# Patient Record
Sex: Female | Born: 1950 | Race: White | Hispanic: No | Smoking: Never smoker
Health system: Southern US, Community
[De-identification: ages and names within clinical notes are randomized; demographics above are authoritative.]

## PROBLEM LIST (undated history)

## (undated) DIAGNOSIS — G8929 Other chronic pain: Secondary | ICD-10-CM

## (undated) DIAGNOSIS — G459 Transient cerebral ischemic attack, unspecified: Secondary | ICD-10-CM

## (undated) DIAGNOSIS — K219 Gastro-esophageal reflux disease without esophagitis: Secondary | ICD-10-CM

## (undated) DIAGNOSIS — N390 Urinary tract infection, site not specified: Secondary | ICD-10-CM

## (undated) DIAGNOSIS — T7840XA Allergy, unspecified, initial encounter: Secondary | ICD-10-CM

## (undated) DIAGNOSIS — G43909 Migraine, unspecified, not intractable, without status migrainosus: Secondary | ICD-10-CM

## (undated) DIAGNOSIS — Z8679 Personal history of other diseases of the circulatory system: Secondary | ICD-10-CM

## (undated) DIAGNOSIS — I10 Essential (primary) hypertension: Secondary | ICD-10-CM

## (undated) DIAGNOSIS — E039 Hypothyroidism, unspecified: Secondary | ICD-10-CM

## (undated) DIAGNOSIS — M1990 Unspecified osteoarthritis, unspecified site: Secondary | ICD-10-CM

## (undated) DIAGNOSIS — R51 Headache: Secondary | ICD-10-CM

## (undated) DIAGNOSIS — K529 Noninfective gastroenteritis and colitis, unspecified: Secondary | ICD-10-CM

## (undated) DIAGNOSIS — F419 Anxiety disorder, unspecified: Secondary | ICD-10-CM

## (undated) DIAGNOSIS — G629 Polyneuropathy, unspecified: Secondary | ICD-10-CM

## (undated) DIAGNOSIS — R519 Headache, unspecified: Secondary | ICD-10-CM

## (undated) HISTORY — PX: BUNIONECTOMY: SHX129

## (undated) HISTORY — DX: Gastro-esophageal reflux disease without esophagitis: K21.9

## (undated) HISTORY — DX: Other chronic pain: G89.29

## (undated) HISTORY — DX: Hypothyroidism, unspecified: E03.9

## (undated) HISTORY — DX: Personal history of other diseases of the circulatory system: Z86.79

## (undated) HISTORY — PX: FOOT SURGERY: SHX648

## (undated) HISTORY — PX: UPPER GI ENDOSCOPY: SHX6162

## (undated) HISTORY — PX: WISDOM TOOTH EXTRACTION: SHX21

## (undated) HISTORY — DX: Unspecified osteoarthritis, unspecified site: M19.90

## (undated) HISTORY — DX: Noninfective gastroenteritis and colitis, unspecified: K52.9

## (undated) HISTORY — DX: Essential (primary) hypertension: I10

## (undated) HISTORY — DX: Transient cerebral ischemic attack, unspecified: G45.9

## (undated) HISTORY — DX: Anxiety disorder, unspecified: F41.9

## (undated) HISTORY — PX: TUBAL LIGATION: SHX77

## (undated) HISTORY — DX: Urinary tract infection, site not specified: N39.0

## (undated) HISTORY — DX: Headache: R51

## (undated) HISTORY — PX: EXCISION OF BREAST BIOPSY: SHX5822

## (undated) HISTORY — DX: Allergy, unspecified, initial encounter: T78.40XA

## (undated) HISTORY — DX: Migraine, unspecified, not intractable, without status migrainosus: G43.909

## (undated) HISTORY — DX: Polyneuropathy, unspecified: G62.9

## (undated) HISTORY — DX: Headache, unspecified: R51.9

## (undated) HISTORY — PX: COLONOSCOPY: SHX174

---
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
## 1982-03-16 DIAGNOSIS — K529 Noninfective gastroenteritis and colitis, unspecified: Secondary | ICD-10-CM

## 1982-03-16 HISTORY — DX: Noninfective gastroenteritis and colitis, unspecified: K52.9

## 2008-03-16 HISTORY — PX: FOOT SURGERY: SHX648

## 2013-03-16 HISTORY — PX: COLONOSCOPY: SHX174

## 2013-12-14 LAB — HM COLONOSCOPY

## 2015-09-02 LAB — HM HEPATITIS C SCREENING LAB: HM Hepatitis Screen: NEGATIVE

## 2016-08-12 IMAGING — US US THYROID
1 series · 13 of 25 positions shown · non-contrast
Comparison: None.

CLINICAL DATA: Hypothyroidism, enlarged thyroid on exam

EXAM:
THYROID ULTRASOUND
TECHNIQUE: Ultrasound examination of the thyroid gland and adjacent soft
tissues was performed.

[Series 1: us thyroid · 0.07mm/px · 50 acquisitions, 13 frames shown]
[im 1/50]
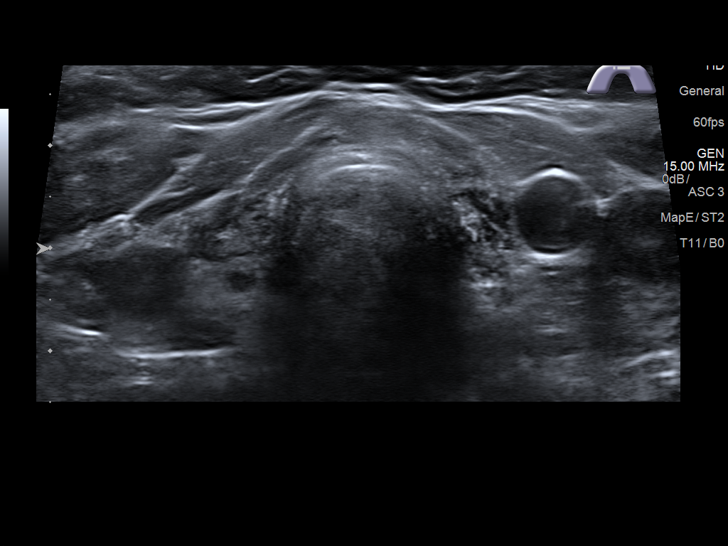
[im 5/50]
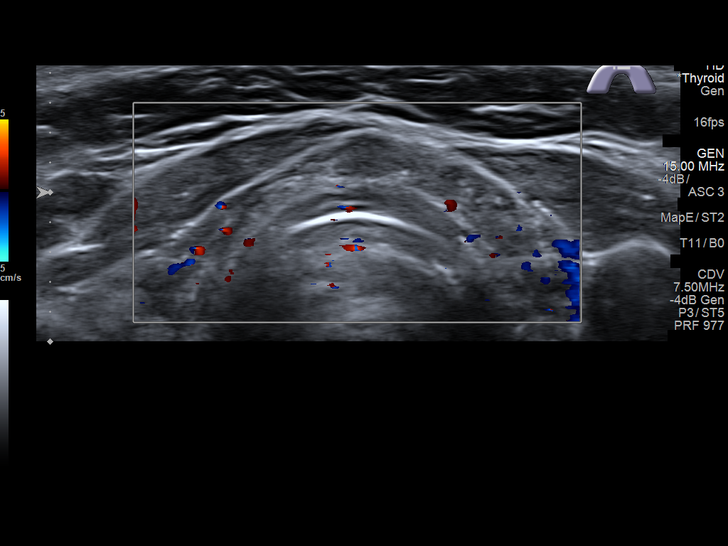
[im 9/50]
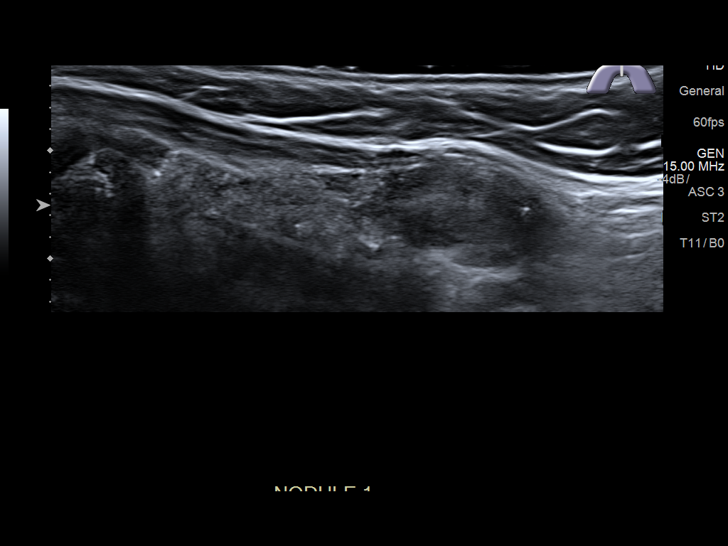
[im 13/50]
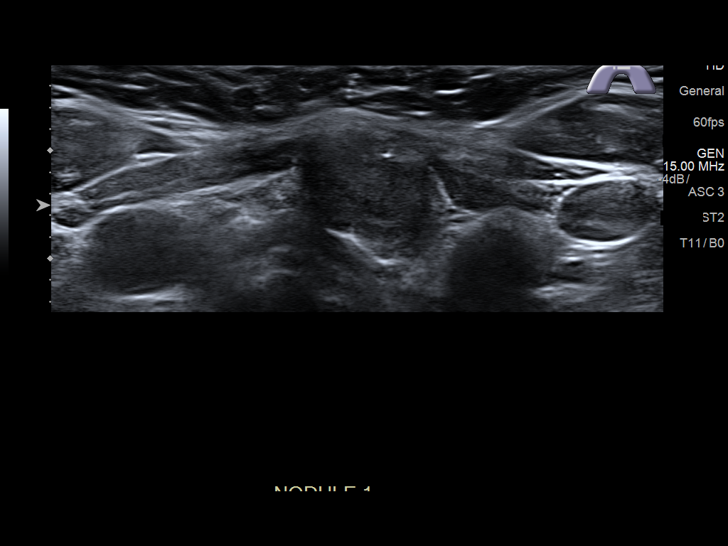
[im 17/50]
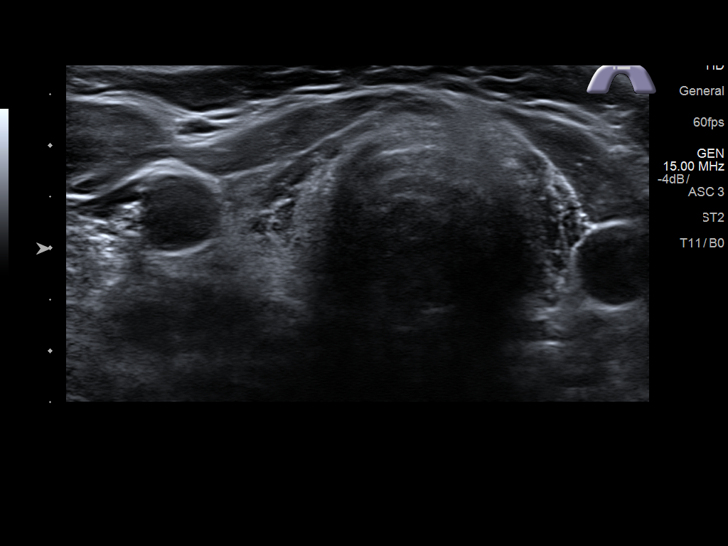
[im 21/50]
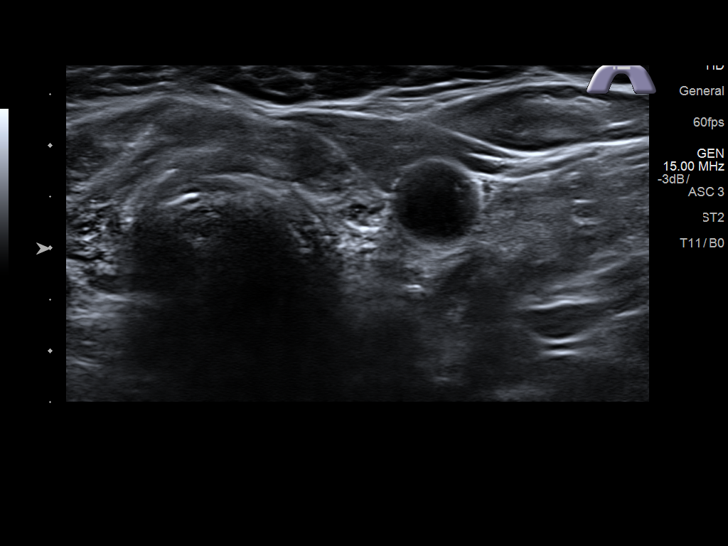
[im 25/50]
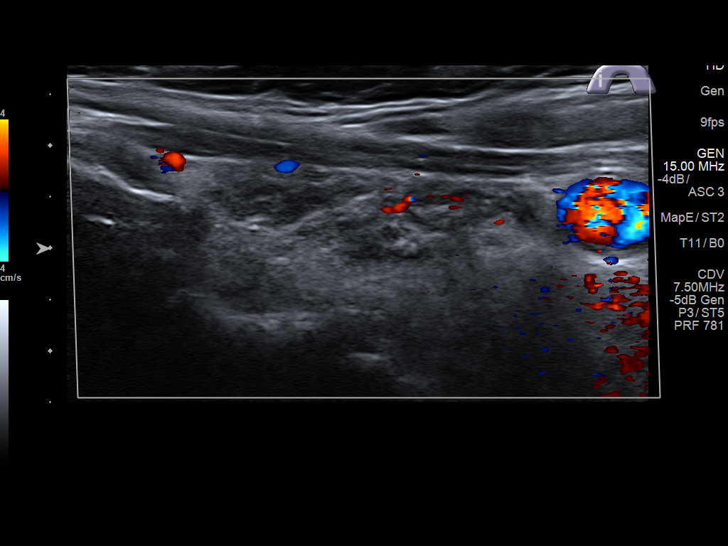
[im 29/50]
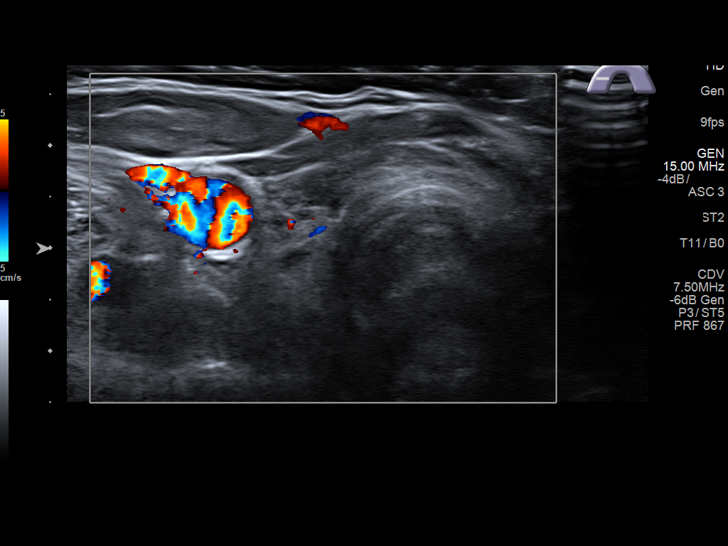
[im 33/50]
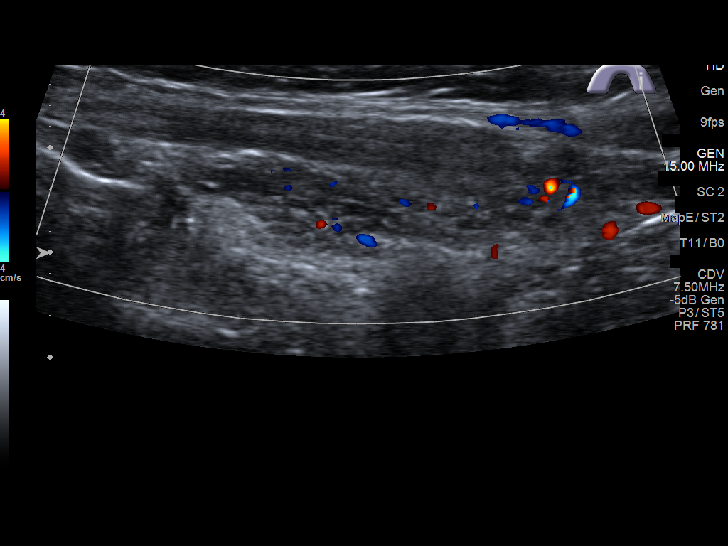
[im 37/50]
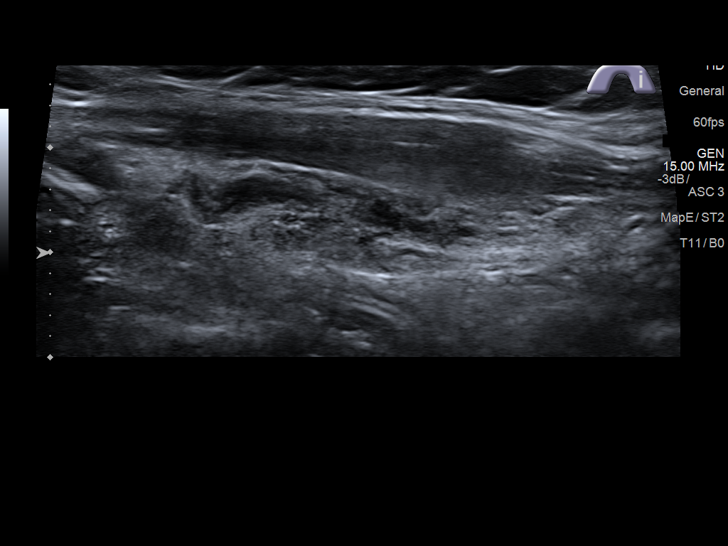
[im 41/50]
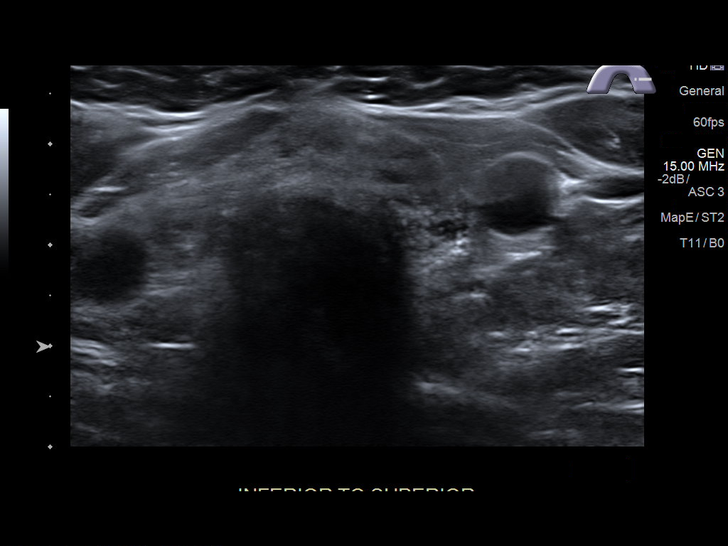
[im 45/50]
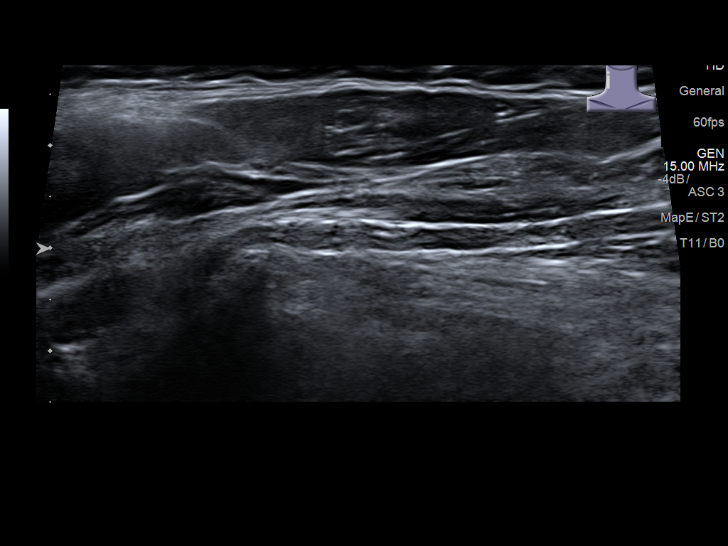
[im 50/50]
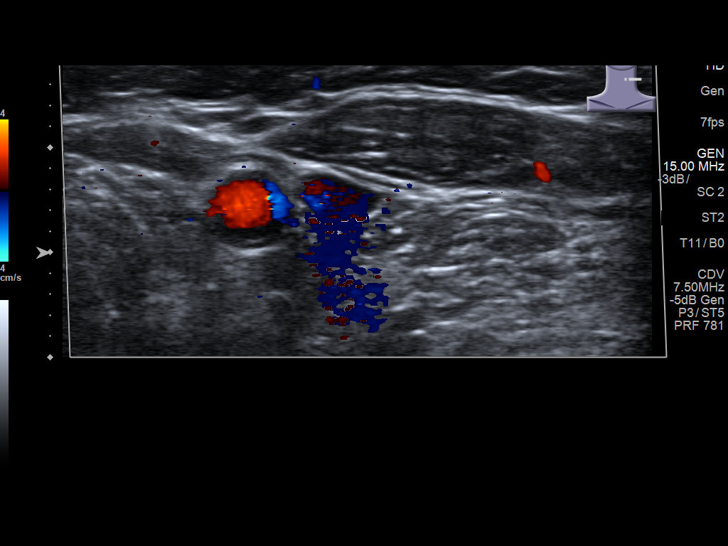

[13 of 25 positions shown; findings below may reference images not displayed]

FINDINGS: Parenchymal Echotexture: Markedly heterogenous

Isthmus: 4 mm

Right lobe: 3.9 x 1.3 x 1.1 cm

Left lobe: 4.2 x 0.9 x

_________________________________________________________

Estimated total number of nodules >/= 1 cm: 1

Number of spongiform nodules >/=  2 cm not described below (TR1): 0

Number of mixed cystic and solid nodules >/= 1.5 cm not described
below (TR2): 0

_________________________________________________________

Nodule # 1:

Location: Isthmus; Inferior

Maximum size: 1.6 cm; Other 2 dimensions: 1.3 x 1.2 cm

Composition: solid/almost completely solid (2)

Echogenicity: hypoechoic (2)

Shape: not taller-than-wide (0)

Margins: ill-defined (0)

Echogenic foci: none (0)

ACR TI-RADS total points: 4.

ACR TI-RADS risk category: TR4 (4-6 points).

ACR TI-RADS recommendations:

**Given size (>/= 1.5 cm) and appearance, fine needle aspiration of
this moderately suspicious nodule should be considered based on
TI-RADS criteria.

_________________________________________________________

Marked gland heterogeneity, nonspecific. No hypervascularity. No
adenopathy.
IMPRESSION: 1.6 cm inferior isthmus TR 4 nodule meets criteria for biopsy as
above.

The above is in keeping with the ACR TI-RADS recommendations - [HOSPITAL] [IW];[DATE].

## 2017-04-21 ENCOUNTER — Ambulatory Visit (INDEPENDENT_AMBULATORY_CARE_PROVIDER_SITE_OTHER): Payer: Medicare Other | Admitting: Primary Care

## 2017-04-21 ENCOUNTER — Encounter: Payer: Self-pay | Admitting: Primary Care

## 2017-04-21 ENCOUNTER — Other Ambulatory Visit: Payer: Self-pay | Admitting: Primary Care

## 2017-04-21 VITALS — BP 128/66 | HR 52 | Temp 97.9°F | Ht 67.5 in | Wt 183.0 lb

## 2017-04-21 DIAGNOSIS — E559 Vitamin D deficiency, unspecified: Secondary | ICD-10-CM | POA: Diagnosis not present

## 2017-04-21 DIAGNOSIS — E039 Hypothyroidism, unspecified: Secondary | ICD-10-CM

## 2017-04-21 DIAGNOSIS — E538 Deficiency of other specified B group vitamins: Secondary | ICD-10-CM

## 2017-04-21 DIAGNOSIS — Z23 Encounter for immunization: Secondary | ICD-10-CM

## 2017-04-21 LAB — COMPREHENSIVE METABOLIC PANEL
ALT: 13 U/L (ref 0–35)
AST: 15 U/L (ref 0–37)
Albumin: 4 g/dL (ref 3.5–5.2)
Alkaline Phosphatase: 93 U/L (ref 39–117)
BUN: 18 mg/dL (ref 6–23)
CO2: 32 mEq/L (ref 19–32)
Calcium: 9.2 mg/dL (ref 8.4–10.5)
Chloride: 104 mEq/L (ref 96–112)
Creatinine, Ser: 0.98 mg/dL (ref 0.40–1.20)
GFR: 60.48 mL/min (ref >60.00)
Glucose, Bld: 96 mg/dL (ref 70–99)
Potassium: 4.1 mEq/L (ref 3.5–5.1)
Sodium: 141 mEq/L (ref 135–145)
Total Bilirubin: 0.7 mg/dL (ref 0.2–1.2)
Total Protein: 6.7 g/dL (ref 6.0–8.3)

## 2017-04-21 LAB — TSH: TSH: 0.3 u[IU]/mL — ABNORMAL LOW (ref 0.35–4.50)

## 2017-04-21 LAB — VITAMIN B12: Vitamin B-12: 720 pg/mL (ref 211–911)

## 2017-04-21 LAB — VITAMIN D 25 HYDROXY (VIT D DEFICIENCY, FRACTURES): VITD: 30.38 ng/mL (ref 30.00–100.00)

## 2017-04-21 MED ORDER — LEVOTHYROXINE SODIUM 100 MCG PO TABS: ORAL_TABLET | ORAL | 1 refills | Status: DC

## 2017-04-21 MED ORDER — ZOSTER VAC RECOMB ADJUVANTED 50 MCG/0.5ML IM SUSR: 0.5000 mL | Freq: Once | INTRAMUSCULAR | 0 refills | Status: AC

## 2017-04-21 NOTE — Progress Notes (Signed)
Subjective:    Patient ID: Christie Wells, female    DOB: Feb 28, 1951, 67 y.o.   MRN: [id number]  HPI   Christie Wells is a 67 year old female who presents today to establish care and discuss the problems mentioned below. Will obtain old records. She recently moved from [place].    1) GERD: Currently managed on Lansoprazole 30 mg. She will experience symptoms of esophageal burning when she misses her pills.   2) Hypothyroidism: Currently managed on levothyroxine 112 mg. Her last thyroid check was one year ago. Her dose of levothyroxine has not changed in 2 years.   3) Migraines/Headaches: Previously following with neurology. Currently managed on amitriptyline 10 mg, gabapentin 200 mg HS, and sumatriptan 50 mg. Experiences migraines 5-6 migraines per month for which she'll use Imitrex. She will get an aura of unilateral or bilateral blurred vision. This has been present since age 20. Overall general headaches have improved since starting gabapentin nightly. She'll take amitriptyline at bedtime for severe headaches.    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   Review of Systems  Constitutional: Negative for fatigue.  Respiratory: Negative for shortness of breath.   Cardiovascular: Negative for chest pain.  Gastrointestinal:       GERD  Neurological:       Chronic headaches/migraines       Past Medical History:  Diagnosis Date  . GERD (gastroesophageal reflux disease)   . Hypothyroidism   . Migraines      Social History   Socioeconomic History  . Marital status: Single    Spouse name: Not on file  . Number of children: Not on file  . Years of education: Not on file  . Highest education level: Not on file  Social Needs  . Financial resource strain: Not on file  . Food insecurity - worry: Not on file  . Food insecurity - inability: Not on file  . Transportation needs -  medical: Not on file  . Transportation needs - non-medical: Not on file  Occupational History  . Not on file  Tobacco Use  . Smoking status: Never Smoker  . Smokeless tobacco: Never Used  Substance and Sexual Activity  . Alcohol use: Yes    Comment: occ  . Drug use: No  . Sexual activity: Not on file  Other Topics Concern  . Not on file  Social History Narrative   Divorced.   2 children.   Moved from [place].   Once worked as an Journalist, newspaper.         Family History  Problem Relation Age of Onset  . Alzheimer's disease Mother   . Hypertension Mother   . Hyperlipidemia Mother   . COPD Mother   . Stroke Father   . Hypertension Father   . Heart disease Maternal Grandmother   . Parkinson's disease Paternal Grandfather     Allergies  Allergen Reactions  . Codeine Nausea And Vomiting    Other reaction(s): vomiting   . Peanut-Containing Drug Products   . Sulfa Antibiotics Rash    Other reaction(s): rash    Current Outpatient Medications on File Prior to Visit  Medication Sig Dispense Refill  . amitriptyline (ELAVIL) 10 MG tablet Take 0.5-1 tablets by mouth daily as needed.  1  . Cholecalciferol (VITAMIN D3) 2000 units TABS Take 1 tablet by mouth daily.    . cyanocobalamin 1000  MCG tablet Take 1,000 mcg by mouth daily.    Marland Kitchen gabapentin (NEURONTIN) 100 MG capsule Take 1 capsule by mouth 2 (two) times daily.    . lansoprazole (PREVACID) 30 MG capsule Take 1 capsule by mouth daily.  2  . levothyroxine (SYNTHROID, LEVOTHROID) 112 MCG tablet Take 1 tablet by mouth every morning.    . SUMAtriptan (IMITREX) 50 MG tablet Take 0.5-1 tablets by mouth daily as needed.     No current facility-administered medications on file prior to visit.     BP 128/66 (BP Location: Left Arm, Patient Position: Sitting, Cuff Size: Normal)   Pulse (!) 52   Temp 97.9 F (36.6 C) (Oral)   Ht 5' 7.5" (1.715 m)   Wt 183 lb (83 kg)   SpO2 96%   BMI 28.24 kg/m    Objective:    Physical Exam  Constitutional: She appears well-nourished.  Neck: Neck supple.  Cardiovascular: Normal rate and regular rhythm.  Pulmonary/Chest: Effort normal and breath sounds normal.  Skin: Skin is warm and dry.  Psychiatric: She has a normal mood and affect.          Assessment & Plan:

## 2017-04-21 NOTE — Assessment & Plan Note (Signed)
Vitamin B 12 level pending. Continue 1000 mcg once daily.

## 2017-04-21 NOTE — Patient Instructions (Signed)
Stop by the lab prior to leaving today. I will notify you of your results once received.   Take the shingles vaccination to the pharmacy.  Schedule a Medicare Wellness Visit with our nurse and a complete physical with me in 6 months.  It was a pleasure to meet you today! Please don't hesitate to call or message me with any questions. Welcome to [facility]!

## 2017-04-21 NOTE — Assessment & Plan Note (Signed)
Managed on vitamin D 2000 units daily, repeat Vitamin D pending.

## 2017-04-21 NOTE — Assessment & Plan Note (Signed)
Repeat TSH today pending.

## 2017-04-23 ENCOUNTER — Telehealth: Payer: Self-pay

## 2017-04-23 NOTE — Telephone Encounter (Signed)
Per lab results, new rx for 153mcg sent in.

## 2017-04-26 ENCOUNTER — Telehealth: Payer: Self-pay

## 2017-04-26 NOTE — Telephone Encounter (Signed)
Please notify patient that I gave her enough for a 2-month (8 week) supply.  We will most definitely refill her medication once we know her thyroid levels are stable.  Looks like she will be due for repeat labs anytime during the week of March 18th.

## 2017-04-26 NOTE — Telephone Encounter (Signed)
Copied from [place]. Topic: Quick Communication - See Telephone Encounter >> Apr 26, 2017  3:15 PM Antonieta Iba C wrote: CRM for notification. See Telephone encounter for: pt called in to schedule her lab apt. Pt says that she was advised to schedule 6 weeks after starting medication. Pt said that she will be out of town the exact 6 weeks so pt scheduled apt for 7 weeks instead. Pt says that her only concern is that she will be out of her thyroid medication in 6 weeks. Pt would like to know if provider could give her another week so that she don't run out of medication?   Please advise.   04/26/17.

## 2017-04-26 NOTE — Telephone Encounter (Signed)
Looks like Anda Kraft had prescribed patient 30 tablets with 1 refill. Which means patient have about 8 weeks of medication. But since patient is concern with forward this to [place].

## 2017-04-27 NOTE — Telephone Encounter (Signed)
Patient stated that she will be out of town and already schedule lab appt on 06/10/2017. Cannot get in sooner than this.

## 2017-04-27 NOTE — Telephone Encounter (Signed)
Noted, she has enough medication to last through April 5th.

## 2017-05-14 ENCOUNTER — Encounter: Payer: Self-pay | Admitting: Primary Care

## 2017-05-26 ENCOUNTER — Encounter: Payer: Self-pay | Admitting: Family Medicine

## 2017-05-26 ENCOUNTER — Ambulatory Visit (INDEPENDENT_AMBULATORY_CARE_PROVIDER_SITE_OTHER): Payer: Medicare Other | Admitting: Family Medicine

## 2017-05-26 VITALS — BP 120/80 | HR 67 | Temp 98.5°F | Wt 182.2 lb

## 2017-05-26 DIAGNOSIS — J018 Other acute sinusitis: Secondary | ICD-10-CM

## 2017-05-26 MED ORDER — AZITHROMYCIN 250 MG PO TABS: ORAL_TABLET | ORAL | 0 refills | Status: DC

## 2017-05-26 NOTE — Progress Notes (Signed)
   Subjective:    Patient ID: Christie Wells, female    DOB: [birth year], 67 y.o.   MRN: [id number]  HPI Here for one week of symptoms that started as itchy red eyes but has now progressed to sinus pressure, PND, and a dry cough. No fever. Using Dayquil and Mucinex.    Review of Systems  Constitutional: Negative.   HENT: Positive for congestion, postnasal drip, sinus pressure, sinus pain and sore throat.   Eyes: Positive for itching. Negative for discharge.  Respiratory: Positive for cough.        Objective:   Physical Exam  Constitutional: She appears well-developed and well-nourished.  HENT:  Right Ear: External ear normal.  Left Ear: External ear normal.  Nose: Nose normal.  Mouth/Throat: Oropharynx is clear and moist.  Eyes: Conjunctivae are normal.  Neck: No thyromegaly present.  Pulmonary/Chest: Effort normal and breath sounds normal. No respiratory distress. She has no wheezes. She has no rales.  Lymphadenopathy:    She has no cervical adenopathy.          Assessment & Plan:  Sinusitis, treat with a Zpack. Add Delsym prn.  Alysia Penna, MD

## 2017-06-10 ENCOUNTER — Other Ambulatory Visit (INDEPENDENT_AMBULATORY_CARE_PROVIDER_SITE_OTHER): Payer: Medicare Other

## 2017-06-10 DIAGNOSIS — E039 Hypothyroidism, unspecified: Secondary | ICD-10-CM

## 2017-06-10 DIAGNOSIS — E034 Atrophy of thyroid (acquired): Secondary | ICD-10-CM | POA: Diagnosis not present

## 2017-06-10 LAB — TSH: TSH: 6.8 u[IU]/mL — ABNORMAL HIGH (ref 0.35–4.50)

## 2017-06-11 ENCOUNTER — Other Ambulatory Visit: Payer: Self-pay | Admitting: Primary Care

## 2017-06-11 DIAGNOSIS — E039 Hypothyroidism, unspecified: Secondary | ICD-10-CM

## 2017-06-15 MED ORDER — LEVOTHYROXINE SODIUM 100 MCG PO TABS: ORAL_TABLET | ORAL | 0 refills | Status: DC

## 2017-06-15 NOTE — Addendum Note (Signed)
Addended by: Lurlean Nanny on: 06/15/2017 05:13 PM   Modules accepted: Orders

## 2017-06-17 ENCOUNTER — Telehealth: Payer: Self-pay

## 2017-06-17 ENCOUNTER — Telehealth: Payer: Self-pay | Admitting: Primary Care

## 2017-06-17 NOTE — Telephone Encounter (Signed)
Copied from [place] [id number]. Topic: Quick Communication - See Telephone Encounter >> Jun 17, 2017  8:50 AM Synthia Innocent wrote: CRM for notification. See Telephone encounter for: 06/17/17. Patient would like to go ahead and try the next dose on levothyroxine (SYNTHROID) 100 MCG tablet. She had spoke to nurse regarding this yesterday and has decided to see if dose could be elevated.

## 2017-06-17 NOTE — Telephone Encounter (Signed)
Will discuss at visit tomorrow

## 2017-06-17 NOTE — Telephone Encounter (Signed)
Copied from [place] [id number]. Topic: Referral - Request >> Jun 17, 2017  8:48 AM Synthia Innocent wrote: Reason for CRM: requesting referral to ortho provider for back pain, ongoing for 4 weeks.  >> Jun 17, 2017 10:29 AM Helene Shoe, LPN wrote: For 4 weeks pt has had lower lt back pain; pt had bent over and when raised up had pain; pain comes and goes. Pt scheduled appt with Gentry Fitz NP on 06/18/17 at 8:45.

## 2017-06-17 NOTE — Telephone Encounter (Signed)
Noted, will evaluate. 

## 2017-06-18 ENCOUNTER — Ambulatory Visit (INDEPENDENT_AMBULATORY_CARE_PROVIDER_SITE_OTHER): Payer: Medicare Other | Admitting: Primary Care

## 2017-06-18 ENCOUNTER — Encounter: Payer: Self-pay | Admitting: Primary Care

## 2017-06-18 VITALS — BP 122/82 | HR 63 | Temp 98.0°F | Ht 67.5 in | Wt 182.2 lb

## 2017-06-18 DIAGNOSIS — E039 Hypothyroidism, unspecified: Secondary | ICD-10-CM

## 2017-06-18 DIAGNOSIS — M5430 Sciatica, unspecified side: Secondary | ICD-10-CM

## 2017-06-18 MED ORDER — PREDNISONE 20 MG PO TABS: ORAL_TABLET | ORAL | 0 refills | Status: DC

## 2017-06-18 NOTE — Progress Notes (Signed)
Subjective:    Patient ID: Christie Wells, female    DOB: [birth year], 67 y.o.   MRN: [id number]  HPI  Ms. Hebel is a 67 year old female who presents today with a chief complaint of back pain and follow up of hypothyroidism.  1) Hypothyroidism: Currently managed on levothyroxine 100 mcg which was switched several days ago given TSH of 0.03. She was previously managed on 112 mcg for which she's taken for 2 years. Since her last visit she's been ill several times with viral colds and stomach illness. She's been switched back and forth between both doses for years.  2) Back Pain: She was squatting down 3 weeks ago to play with her granddaughter. When standing up she notice a sudden sharp pain with decreased ability to walk to the posterior left upper buttocks. After resting she felt improved. Since then she noticed a few episodes of this pain that occurred sporadically with walking, making her bed, and when golfing.   Her pain will radiate down to her left lower extremity. Several days ago she noticed tingling to her left toes. Most of the time she doesn't experience pain. She's taken Advil with some improvement. She denies weakness, loss of bowel or bladder control.  Review of Systems  Musculoskeletal: Positive for back pain.  Skin: Negative for color change.  Neurological: Negative for weakness.       Tingling to toes       Past Medical History:  Diagnosis Date  . GERD (gastroesophageal reflux disease)   . Hypothyroidism   . Migraines      Social History   Socioeconomic History  . Marital status: Single    Spouse name: Not on file  . Number of children: Not on file  . Years of education: Not on file  . Highest education level: Not on file  Occupational History  . Not on file  Social Needs  . Financial resource strain: Not on file  . Food insecurity:    Worry: Not on file    Inability: Not on file  . Transportation needs:    Medical: Not on file    Non-medical: Not on  file  Tobacco Use  . Smoking status: Never Smoker  . Smokeless tobacco: Never Used  Substance and Sexual Activity  . Alcohol use: Yes    Comment: occ  . Drug use: No  . Sexual activity: Not on file  Lifestyle  . Physical activity:    Days per week: Not on file    Minutes per session: Not on file  . Stress: Not on file  Relationships  . Social connections:    Talks on phone: Not on file    Gets together: Not on file    Attends religious service: Not on file    Active member of club or organization: Not on file    Attends meetings of clubs or organizations: Not on file    Relationship status: Not on file  . Intimate partner violence:    Fear of current or ex partner: Not on file    Emotionally abused: Not on file    Physically abused: Not on file    Forced sexual activity: Not on file  Other Topics Concern  . Not on file  Social History Narrative   Divorced.   2 children.   Moved from [place].   Once worked as an Journalist, newspaper.        Past Surgical History:  Procedure Laterality Date  .  BUNIONECTOMY Bilateral   . COLONOSCOPY    . EXCISION OF BREAST BIOPSY Left   . TUBAL LIGATION    . UPPER GI ENDOSCOPY     Gastritis    Family History  Problem Relation Age of Onset  . Alzheimer's disease Mother   . Hypertension Mother   . Hyperlipidemia Mother   . COPD Mother   . Stroke Father   . Hypertension Father   . Heart disease Maternal Grandmother   . Parkinson's disease Paternal Grandfather     Allergies  Allergen Reactions  . Codeine Nausea And Vomiting    Other reaction(s): vomiting   . Peanut-Containing Drug Products   . Sulfa Antibiotics Rash    Other reaction(s): rash    Current Outpatient Medications on File Prior to Visit  Medication Sig Dispense Refill  . amitriptyline (ELAVIL) 10 MG tablet Take 0.5-1 tablets by mouth daily as needed.  1  . Cholecalciferol (VITAMIN D3) 2000 units TABS Take 1 tablet by mouth daily.    . cyanocobalamin  1000 MCG tablet Take 1,000 mcg by mouth daily.    Marland Kitchen gabapentin (NEURONTIN) 100 MG capsule Take 1 capsule by mouth 2 (two) times daily.    . lansoprazole (PREVACID) 30 MG capsule Take 1 capsule by mouth daily.  2  . levothyroxine (SYNTHROID) 100 MCG tablet Take 1 tablet by mouth every morning on an empty stomach with a full glass of water. 30 tablet 0  . SUMAtriptan (IMITREX) 50 MG tablet Take 0.5-1 tablets by mouth daily as needed.     No current facility-administered medications on file prior to visit.     BP 122/82   Pulse 63   Temp 98 F (36.7 C) (Oral)   Ht 5' 7.5" (1.715 m)   Wt 182 lb 4 oz (82.7 kg)   SpO2 97%   BMI 28.12 kg/m    Objective:   Physical Exam  Constitutional: She appears well-nourished.  Cardiovascular: Normal rate.  Pulmonary/Chest: Effort normal.  Musculoskeletal:       Lumbar back: She exhibits normal range of motion, no tenderness, no bony tenderness, no pain and no spasm.       Back:  Negative straight leg raise bilaterally  Skin: Skin is warm and dry.          Assessment & Plan:  Acute Sciatica:  Intermittent left buttocks pain x 3 weeks. Exam today without suspicion for disc herniation or cauda equina. Rx for Prednisone course sent to pharmacy. Discussed to avoid NSAID's, stretching exercises provided, discussed ice/heat. Follow up PRN.  Pleas Koch, NP

## 2017-06-18 NOTE — Patient Instructions (Signed)
Start prednisone. Take 3 tablets for 3 days, then 2 tablets for 3 days, then 1 tablet for 3 days.  Do not take Advil, Motrin, Aleve, naproxen, ibuprofen while on prednisone. You can take Tylenol as needed for breakthrough pain.  Try the stretching exercises below.  It was a pleasure to see you today!   Back Exercises The following exercises strengthen the muscles that help to support the back. They also help to keep the lower back flexible. Doing these exercises can help to prevent back pain or lessen existing pain. If you have back pain or discomfort, try doing these exercises 2-3 times each day or as told by your health care provider. When the pain goes away, do them once each day, but increase the number of times that you repeat the steps for each exercise (do more repetitions). If you do not have back pain or discomfort, do these exercises once each day or as told by your health care provider. Exercises Single Knee to Chest  Repeat these steps 3-5 times for each leg: 1. Lie on your back on a firm bed or the floor with your legs extended. 2. Bring one knee to your chest. Your other leg should stay extended and in contact with the floor. 3. Hold your knee in place by grabbing your knee or thigh. 4. Pull on your knee until you feel a gentle stretch in your lower back. 5. Hold the stretch for 10-30 seconds. 6. Slowly release and straighten your leg.  Pelvic Tilt  Repeat these steps 5-10 times: 1. Lie on your back on a firm bed or the floor with your legs extended. 2. Bend your knees so they are pointing toward the ceiling and your feet are flat on the floor. 3. Tighten your lower abdominal muscles to press your lower back against the floor. This motion will tilt your pelvis so your tailbone points up toward the ceiling instead of pointing to your feet or the floor. 4. With gentle tension and even breathing, hold this position for 5-10 seconds.  Cat-Cow  Repeat these steps until your  lower back becomes more flexible: 1. Get into a hands-and-knees position on a firm surface. Keep your hands under your shoulders, and keep your knees under your hips. You may place padding under your knees for comfort. 2. Let your head hang down, and point your tailbone toward the floor so your lower back becomes rounded like the back of a cat. 3. Hold this position for 5 seconds. 4. Slowly lift your head and point your tailbone up toward the ceiling so your back forms a sagging arch like the back of a cow. 5. Hold this position for 5 seconds.  Press-Ups  Repeat these steps 5-10 times: 1. Lie on your abdomen (face-down) on the floor. 2. Place your palms near your head, about shoulder-width apart. 3. While you keep your back as relaxed as possible and keep your hips on the floor, slowly straighten your arms to raise the top half of your body and lift your shoulders. Do not use your back muscles to raise your upper torso. You may adjust the placement of your hands to make yourself more comfortable. 4. Hold this position for 5 seconds while you keep your back relaxed. 5. Slowly return to lying flat on the floor.  Bridges  Repeat these steps 10 times: 1. Lie on your back on a firm surface. 2. Bend your knees so they are pointing toward the ceiling and your feet are  flat on the floor. 3. Tighten your buttocks muscles and lift your buttocks off of the floor until your waist is at almost the same height as your knees. You should feel the muscles working in your buttocks and the back of your thighs. If you do not feel these muscles, slide your feet 1-2 inches farther away from your buttocks. 4. Hold this position for 3-5 seconds. 5. Slowly lower your hips to the starting position, and allow your buttocks muscles to relax completely.  If this exercise is too easy, try doing it with your arms crossed over your chest. Abdominal Crunches  Repeat these steps 5-10 times: 1. Lie on your back on a firm  bed or the floor with your legs extended. 2. Bend your knees so they are pointing toward the ceiling and your feet are flat on the floor. 3. Cross your arms over your chest. 4. Tip your chin slightly toward your chest without bending your neck. 5. Tighten your abdominal muscles and slowly raise your trunk (torso) high enough to lift your shoulder blades a tiny bit off of the floor. Avoid raising your torso higher than that, because it can put too much stress on your low back and it does not help to strengthen your abdominal muscles. 6. Slowly return to your starting position.  Back Lifts Repeat these steps 5-10 times: 1. Lie on your abdomen (face-down) with your arms at your sides, and rest your forehead on the floor. 2. Tighten the muscles in your legs and your buttocks. 3. Slowly lift your chest off of the floor while you keep your hips pressed to the floor. Keep the back of your head in line with the curve in your back. Your eyes should be looking at the floor. 4. Hold this position for 3-5 seconds. 5. Slowly return to your starting position.  Contact a health care provider if:  Your back pain or discomfort gets much worse when you do an exercise.  Your back pain or discomfort does not lessen within 2 hours after you exercise. If you have any of these problems, stop doing these exercises right away. Do not do them again unless your health care provider says that you can. Get help right away if:  You develop sudden, severe back pain. If this happens, stop doing the exercises right away. Do not do them again unless your health care provider says that you can. This information is not intended to replace advice given to you by your health care provider. Make sure you discuss any questions you have with your health care provider. Document Released: 04/09/2004 Document Revised: 07/10/2015 Document Reviewed: 04/26/2014 Elsevier Interactive Patient Education  2017 [employer].

## 2017-06-18 NOTE — Assessment & Plan Note (Signed)
Continue on levothyroxine 100 mcg and repeat TSH in a few weeks as scheduled.

## 2017-07-10 ENCOUNTER — Other Ambulatory Visit: Payer: Self-pay | Admitting: Primary Care

## 2017-07-10 DIAGNOSIS — E039 Hypothyroidism, unspecified: Secondary | ICD-10-CM

## 2017-07-12 ENCOUNTER — Other Ambulatory Visit (INDEPENDENT_AMBULATORY_CARE_PROVIDER_SITE_OTHER): Payer: Medicare Other

## 2017-07-12 DIAGNOSIS — E039 Hypothyroidism, unspecified: Secondary | ICD-10-CM | POA: Diagnosis not present

## 2017-07-12 LAB — TSH: TSH: 2.32 u[IU]/mL (ref 0.35–4.50)

## 2017-07-14 ENCOUNTER — Ambulatory Visit (INDEPENDENT_AMBULATORY_CARE_PROVIDER_SITE_OTHER): Payer: Medicare Other | Admitting: Primary Care

## 2017-07-14 ENCOUNTER — Encounter: Payer: Self-pay | Admitting: Primary Care

## 2017-07-14 ENCOUNTER — Ambulatory Visit (INDEPENDENT_AMBULATORY_CARE_PROVIDER_SITE_OTHER)
Admission: RE | Admit: 2017-07-14 | Discharge: 2017-07-14 | Disposition: A | Discharge status: Home / Self Care | Payer: Medicare Other | Source: Ambulatory Visit | Attending: Primary Care | Admitting: Primary Care

## 2017-07-14 VITALS — BP 122/82 | HR 65 | Temp 97.9°F | Ht 67.5 in | Wt 179.5 lb

## 2017-07-14 DIAGNOSIS — G8929 Other chronic pain: Secondary | ICD-10-CM | POA: Insufficient documentation

## 2017-07-14 DIAGNOSIS — M5442 Lumbago with sciatica, left side: Secondary | ICD-10-CM

## 2017-07-14 DIAGNOSIS — M549 Dorsalgia, unspecified: Secondary | ICD-10-CM | POA: Insufficient documentation

## 2017-07-14 IMAGING — DX DG LUMBAR SPINE COMPLETE 4+V
5 series · 5 of 5 positions shown · non-contrast
Comparison: None.

CLINICAL DATA: Lower back and left lower extremity pain for several
weeks without known injury.

EXAM:
LUMBAR SPINE - COMPLETE 4+ VIEW

[l-spine ap]
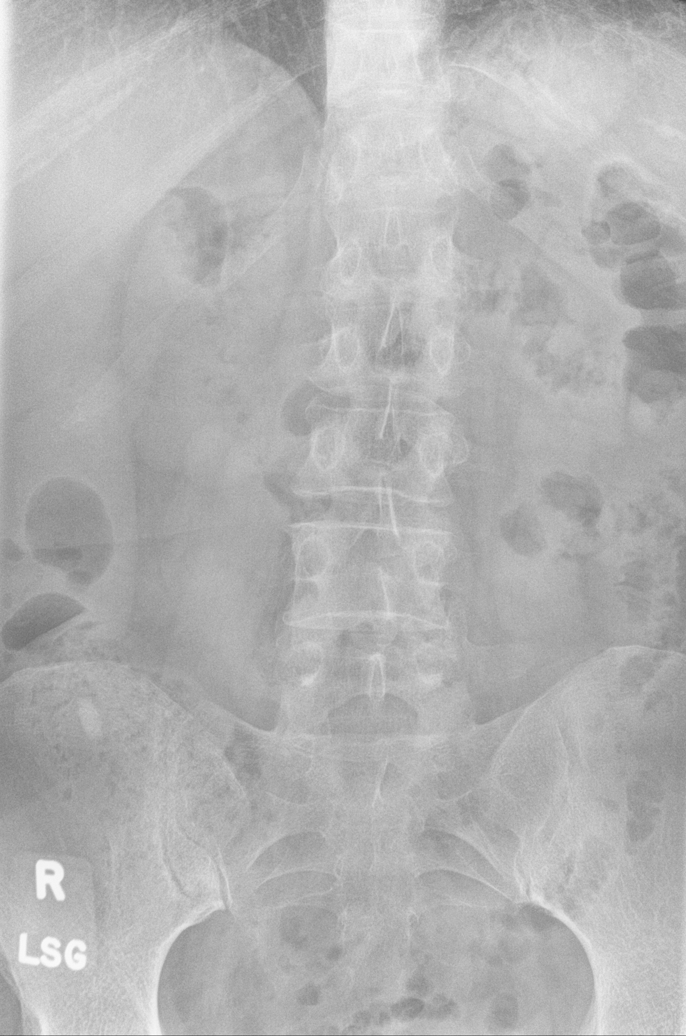

[l-spine obl (1 of 2)]
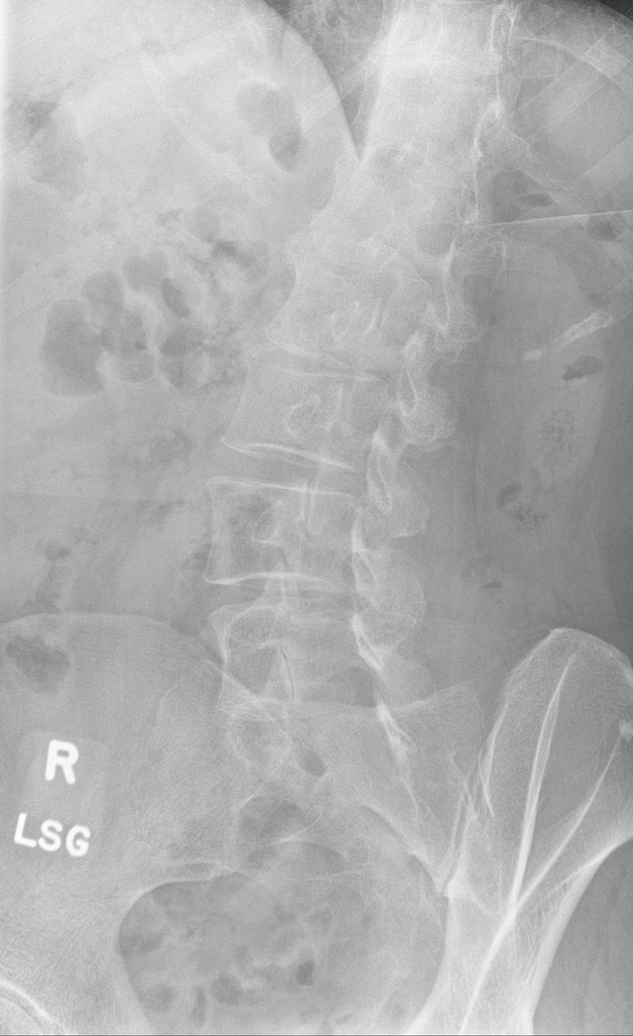

[l-spine obl (2 of 2)]
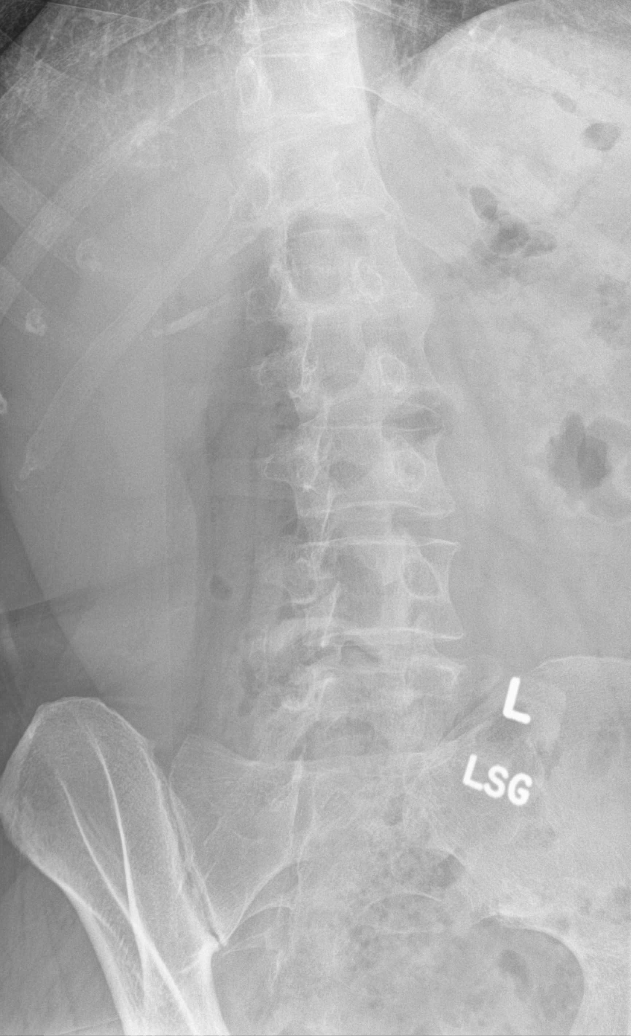

[l-spine lat]
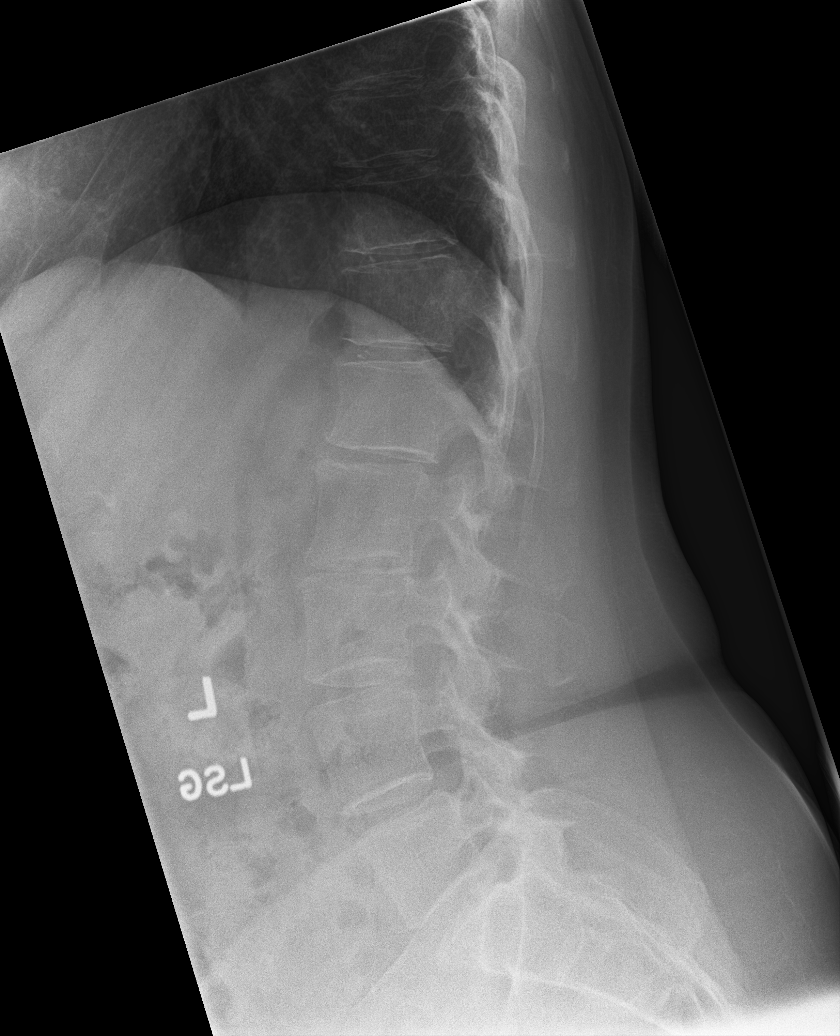

[l-spine l5/s1]
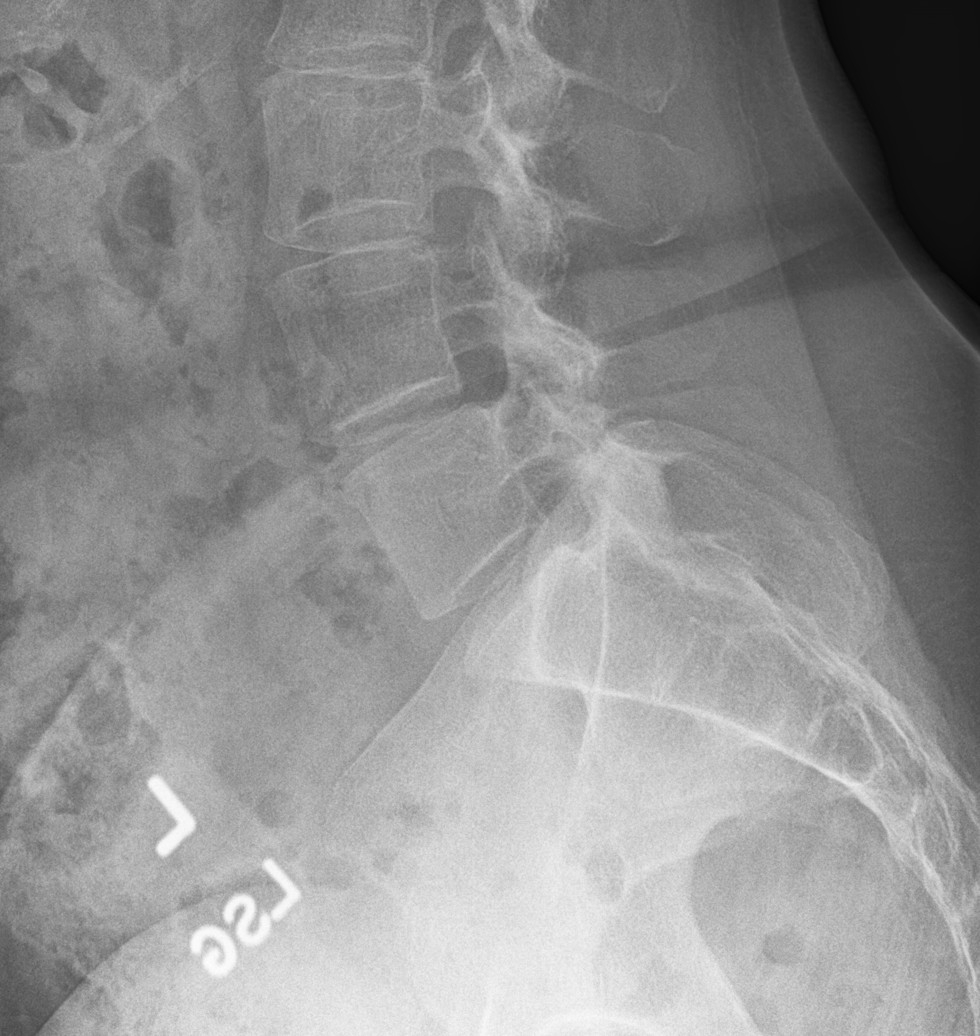

[5 of 5 positions shown; findings below may reference images not displayed]

FINDINGS: No fracture or spondylolisthesis is noted. Mild degenerative disc
disease is noted at L1-2 and L2-3. Posterior facet joints are
unremarkable.
IMPRESSION: Mild multilevel degenerative disc disease. No acute abnormality seen
in the lumbar spine.

## 2017-07-14 MED ORDER — MELOXICAM 15 MG PO TABS: ORAL_TABLET | ORAL | 1 refills | Status: DC

## 2017-07-14 NOTE — Patient Instructions (Addendum)
Complete xray(s) prior to leaving today. I will notify you of your results once received.  Stop by the front desk and speak with either Rosaria Ferries or Anastasiya regarding your referral to Physical Therapy.  Take the Meloxicam once daily as needed for pain. You may supplement with Tylenol in between, not to exceed 3000 mg in 24 hours.  It was a pleasure to see you today!

## 2017-07-14 NOTE — Progress Notes (Signed)
Subjective:    Patient ID: Christie Wells, female    DOB: 05-07-1950, 67 y.o.   MRN: [id number]  HPI  [name] is a 67 year old female who presents today with a chief complaint of low back pain.   She was last evaluated on 06/18/17 with complaints of sudden onset of sharp pain to the left posterior buttocks with decreased ROM to her lower back and difficulty ambulating after squatting down to play with her grandchild. Her pain radiated down to her left lower extremity. Her symptoms were intermittent but very uncomfortable during occurences. She was treated for acute sciatica with a prednisone course and conservative measures (stretching/ice/heat).   Since her last visit she's been stretching and was compliant to her prednisone. She's had no improvement in her symptoms. She's been unable to do any of her typical exercises such as swimming and running. She's taking Advil 400 mg BID with some improvement.   Her pain is located to the mid left buttocks with radiation to her lower mid back. She does still have intermittent radiation down her left lower extremity. She denies tingling to her toes, changes in bowel/bladder patterns.  Review of Systems  Musculoskeletal: Positive for back pain.  Skin: Negative for color change.  Neurological: Negative for numbness.       Past Medical History:  Diagnosis Date  . GERD (gastroesophageal reflux disease)   . Hypothyroidism   . Migraines      Social History   Socioeconomic History  . Marital status: Single    Spouse name: Not on file  . Number of children: Not on file  . Years of education: Not on file  . Highest education level: Not on file  Occupational History  . Not on file  Social Needs  . Financial resource strain: Not on file  . Food insecurity:    Worry: Not on file    Inability: Not on file  . Transportation needs:    Medical: Not on file    Non-medical: Not on file  Tobacco Use  . Smoking status: Never Smoker  .  Smokeless tobacco: Never Used  Substance and Sexual Activity  . Alcohol use: Yes    Comment: occ  . Drug use: No  . Sexual activity: Not on file  Lifestyle  . Physical activity:    Days per week: Not on file    Minutes per session: Not on file  . Stress: Not on file  Relationships  . Social connections:    Talks on phone: Not on file    Gets together: Not on file    Attends religious service: Not on file    Active member of club or organization: Not on file    Attends meetings of clubs or organizations: Not on file    Relationship status: Not on file  . Intimate partner violence:    Fear of current or ex partner: Not on file    Emotionally abused: Not on file    Physically abused: Not on file    Forced sexual activity: Not on file  Other Topics Concern  . Not on file  Social History Narrative   Divorced.   2 children.   Moved from [place].   Once worked as an Journalist, newspaper.        Past Surgical History:  Procedure Laterality Date  . BUNIONECTOMY Bilateral   . COLONOSCOPY    . EXCISION OF BREAST BIOPSY Left   . TUBAL LIGATION    .  UPPER GI ENDOSCOPY     Gastritis    Family History  Problem Relation Age of Onset  . Alzheimer's disease Mother   . Hypertension Mother   . Hyperlipidemia Mother   . COPD Mother   . Stroke Father   . Hypertension Father   . Heart disease Maternal Grandmother   . Parkinson's disease Paternal Grandfather     Allergies  Allergen Reactions  . Codeine Nausea And Vomiting    Other reaction(s): vomiting   . Peanut-Containing Drug Products   . Sulfa Antibiotics Rash    Other reaction(s): rash    Current Outpatient Medications on File Prior to Visit  Medication Sig Dispense Refill  . amitriptyline (ELAVIL) 10 MG tablet Take 0.5-1 tablets by mouth daily as needed.  1  . Cholecalciferol (VITAMIN D3) 2000 units TABS Take 1 tablet by mouth daily.    . cyanocobalamin 1000 MCG tablet Take 1,000 mcg by mouth daily.    Marland Kitchen  gabapentin (NEURONTIN) 100 MG capsule Take 1 capsule by mouth 2 (two) times daily.    . lansoprazole (PREVACID) 30 MG capsule Take 1 capsule by mouth daily.  2  . levothyroxine (SYNTHROID, LEVOTHROID) 100 MCG tablet TAKE 1 TABLET BY MOUTH EVERY MORNING ON AN EMPTY STOMACH WITH A FULL GLASS OF WATER. 90 tablet 1  . SUMAtriptan (IMITREX) 50 MG tablet Take 0.5-1 tablets by mouth daily as needed.     No current facility-administered medications on file prior to visit.     BP 122/82   Pulse 65   Temp 97.9 F (36.6 C) (Oral)   Ht 5' 7.5" (1.715 m)   Wt 179 lb 8 oz (81.4 kg)   SpO2 98%   BMI 27.70 kg/m    Objective:   Physical Exam  Constitutional: She appears well-nourished.  Musculoskeletal:       Lumbar back: She exhibits pain. She exhibits no bony tenderness, no swelling and no spasm.       Back:  Overall good ROM. Does have pain with lumbar extension, left rotation, left lateral bending. Negative straight leg raise.  Skin: Skin is warm and dry.          Assessment & Plan:

## 2017-07-14 NOTE — Assessment & Plan Note (Addendum)
Present for 4+ weeks without improvement with prednisone course, home stretching, ice/heat. Will obtain plain films of lumbar spine today. Referral placed to PT for further evaluation.  No alarm signs. Rx for Meloxicam sent to pharmacy to use PRN. Discussed Tylenol use in between dose if needed.

## 2017-09-21 ENCOUNTER — Ambulatory Visit (INDEPENDENT_AMBULATORY_CARE_PROVIDER_SITE_OTHER): Payer: Medicare Other

## 2017-09-21 DIAGNOSIS — E039 Hypothyroidism, unspecified: Secondary | ICD-10-CM

## 2017-09-21 NOTE — Progress Notes (Signed)
AWV not completed. Patient is due for IPPE with PCP. Appt scheduled. Discussion with patient regarding PNA vaccines and other HM requirements. No labs completed at this time.

## 2017-10-05 ENCOUNTER — Encounter: Payer: Self-pay | Admitting: Primary Care

## 2017-10-05 ENCOUNTER — Ambulatory Visit (INDEPENDENT_AMBULATORY_CARE_PROVIDER_SITE_OTHER): Payer: Medicare Other | Admitting: Primary Care

## 2017-10-05 VITALS — BP 128/82 | HR 54 | Temp 97.8°F | Ht 67.5 in | Wt 177.8 lb

## 2017-10-05 DIAGNOSIS — Z13220 Encounter for screening for lipoid disorders: Secondary | ICD-10-CM | POA: Diagnosis not present

## 2017-10-05 DIAGNOSIS — Z23 Encounter for immunization: Secondary | ICD-10-CM

## 2017-10-05 DIAGNOSIS — E2839 Other primary ovarian failure: Secondary | ICD-10-CM

## 2017-10-05 DIAGNOSIS — E039 Hypothyroidism, unspecified: Secondary | ICD-10-CM | POA: Diagnosis not present

## 2017-10-05 DIAGNOSIS — K219 Gastro-esophageal reflux disease without esophagitis: Secondary | ICD-10-CM

## 2017-10-05 DIAGNOSIS — Z0000 Encounter for general adult medical examination without abnormal findings: Secondary | ICD-10-CM | POA: Diagnosis not present

## 2017-10-05 DIAGNOSIS — E559 Vitamin D deficiency, unspecified: Secondary | ICD-10-CM

## 2017-10-05 DIAGNOSIS — G43101 Migraine with aura, not intractable, with status migrainosus: Secondary | ICD-10-CM

## 2017-10-05 DIAGNOSIS — Z0100 Encounter for examination of eyes and vision without abnormal findings: Secondary | ICD-10-CM

## 2017-10-05 DIAGNOSIS — M5442 Lumbago with sciatica, left side: Secondary | ICD-10-CM

## 2017-10-05 DIAGNOSIS — G43909 Migraine, unspecified, not intractable, without status migrainosus: Secondary | ICD-10-CM | POA: Insufficient documentation

## 2017-10-05 DIAGNOSIS — Z0120 Encounter for dental examination and cleaning without abnormal findings: Secondary | ICD-10-CM

## 2017-10-05 LAB — BASIC METABOLIC PANEL
BUN: 14 mg/dL (ref 6–23)
CO2: 32 mEq/L (ref 19–32)
Calcium: 9.4 mg/dL (ref 8.4–10.5)
Chloride: 103 mEq/L (ref 96–112)
Creatinine, Ser: 0.99 mg/dL (ref 0.40–1.20)
GFR: 59.69 mL/min — ABNORMAL LOW (ref >60.00)
Glucose, Bld: 95 mg/dL (ref 70–99)
Potassium: 4 mEq/L (ref 3.5–5.1)
Sodium: 142 mEq/L (ref 135–145)

## 2017-10-05 LAB — LIPID PANEL
Cholesterol: 202 mg/dL — ABNORMAL HIGH (ref 0–200)
HDL: 54.2 mg/dL (ref >39.00)
LDL Cholesterol: 118 mg/dL — ABNORMAL HIGH (ref 0–99)
NonHDL: 147.76
Total CHOL/HDL Ratio: 4
Triglycerides: 148 mg/dL (ref 0.0–149.0)
VLDL: 29.6 mg/dL (ref 0.0–40.0)

## 2017-10-05 LAB — TSH: TSH: 0.62 u[IU]/mL (ref 0.35–4.50)

## 2017-10-05 MED ORDER — LANSOPRAZOLE 30 MG PO CPDR: 30.0000 mg | DELAYED_RELEASE_CAPSULE | Freq: Every day | ORAL | 3 refills | Status: DC

## 2017-10-05 MED ORDER — GABAPENTIN 100 MG PO CAPS: 200.0000 mg | ORAL_CAPSULE | Freq: Every day | ORAL | 3 refills | Status: DC

## 2017-10-05 MED ORDER — PNEUMOCOCCAL 13-VAL CONJ VACC IM SUSP: 0.5000 mL | Freq: Once | INTRAMUSCULAR | 0 refills | Status: AC

## 2017-10-05 NOTE — Assessment & Plan Note (Signed)
Using sumatriptan 25 mg 6-7 times monthly on average. Taking gabapentin 200 mg HS, everynight for migraine/headache prevention. Using amitriptyline sparingly if sumatriptan doesn't help.    This regimen works well for migraine and headache prevention. History of headaches/migraines since the age of 32.  Refills sent to pharmacy.

## 2017-10-05 NOTE — Assessment & Plan Note (Signed)
Prevnar due, Rx provided. Tetanus, Shingles UTD. Colonoscopy UTD. Mammogram due, she would like to defer until next year. Bone density due, ordered. Recommended continued exercise and to work on diet. Advanced directives completed per patient. Exam unremarkable. Labs pending. All recommendations provided at end of visit.   I have personally reviewed and have noted: 1. The patient's medical and social history 2. Their use of alcohol, tobacco or illicit drugs 3. Their current medications and supplements 4. The patient's functional ability including ADL's, fall risks, home safety risks and  hearing or visual impairment. 5. Diet and physical activities 6. Evidence for depression or mood disorder

## 2017-10-05 NOTE — Assessment & Plan Note (Signed)
Overall improved but has hit a plateau with improvement. Continues to see physical therapy but may be referred to orthopedics.

## 2017-10-05 NOTE — Progress Notes (Signed)
Patient ID: Christie Wells, female   DOB: [birth year], 67 y.o.   MRN: [id number]  Christie Wells is a 67 year old female who presents today for her [facility] [employer] Visit.  HPI:  Past Medical History:  Diagnosis Date  . GERD (gastroesophageal reflux disease)   . Hypothyroidism   . Migraines     Current Outpatient Medications  Medication Sig Dispense Refill  . amitriptyline (ELAVIL) 10 MG tablet Take 0.5-1 tablets by mouth daily as needed.  1  . Cholecalciferol (VITAMIN D3) 2000 units TABS Take 1 tablet by mouth daily.    . cyanocobalamin 1000 MCG tablet Take 1,000 mcg by mouth daily.    Marland Kitchen gabapentin (NEURONTIN) 100 MG capsule Take 1 capsule by mouth 2 (two) times daily.    . lansoprazole (PREVACID) 30 MG capsule Take 1 capsule by mouth daily.  2  . levothyroxine (SYNTHROID, LEVOTHROID) 100 MCG tablet TAKE 1 TABLET BY MOUTH EVERY MORNING ON AN EMPTY STOMACH WITH A FULL GLASS OF WATER. 90 tablet 1  . meloxicam (MOBIC) 15 MG tablet Take 1 tablet by mouth once daily as needed for pain. 30 tablet 1  . SUMAtriptan (IMITREX) 50 MG tablet Take 0.5-1 tablets by mouth daily as needed.     No current facility-administered medications for this visit.     Allergies  Allergen Reactions  . Codeine Nausea And Vomiting    Other reaction(s): vomiting   . Peanut-Containing Drug Products     Headaches  . Sulfa Antibiotics Rash    Other reaction(s): rash    Family History  Problem Relation Age of Onset  . Alzheimer's disease Mother   . Hypertension Mother   . Hyperlipidemia Mother   . COPD Mother   . Stroke Father   . Hypertension Father   . Heart disease Maternal Grandmother   . Parkinson's disease Paternal Grandfather     Social History   Socioeconomic History  . Marital status: Single    Spouse name: Not on file  . Number of children: Not on file  . Years of education: Not on file  . Highest education level: Not on file  Occupational History  . Not on file  Social  Needs  . Financial resource strain: Not on file  . Food insecurity:    Worry: Not on file    Inability: Not on file  . Transportation needs:    Medical: Not on file    Non-medical: Not on file  Tobacco Use  . Smoking status: Never Smoker  . Smokeless tobacco: Never Used  Substance and Sexual Activity  . Alcohol use: Yes    Comment: occ  . Drug use: No  . Sexual activity: Not on file  Lifestyle  . Physical activity:    Days per week: Not on file    Minutes per session: Not on file  . Stress: Not on file  Relationships  . Social connections:    Talks on phone: Not on file    Gets together: Not on file    Attends religious service: Not on file    Active member of club or organization: Not on file    Attends meetings of clubs or organizations: Not on file    Relationship status: Not on file  . Intimate partner violence:    Fear of current or ex partner: Not on file    Emotionally abused: Not on file    Physically abused: Not on file    Forced sexual activity:  Not on file  Other Topics Concern  . Not on file  Social History Narrative   Divorced.   2 children.   Moved from [place].   Once worked as an Journalist, newspaper.        Hospitiliaztions: None  Health Maintenance:    Flu: Completed last season  Tetanus: Completed in 2016  Pneumovax: Never completed  Prevnar: Never completed, due.   Zostavax: Completed in 2014  Shingrix: Completed in 2018, 2019  Bone Density: Never completed  Colonoscopy: Completed in 2015  Eye Doctor: Due  Dental Exam: Due  Mammogram: Completed in August 2018 ,due in 2020  Pap: Completed in 2017  Hep C Screening: negative in 2017    Providers: Alma Friendly, PCP; [facility]   I have personally reviewed and have noted: 1. The patient's medical and social history 2. Their use of alcohol, tobacco or illicit drugs 3. Their current medications and supplements 4. The patient's functional ability including ADL's, fall risks, home  safety risks and  hearing or visual impairment. 5. Diet and physical activities 6. Evidence for depression or mood disorder  Subjective:   Review of Systems:   Constitutional: Denies fever, malaise, fatigue, headache or abrupt weight changes.  HEENT: Denies eye pain, eye redness, ear pain, ringing in the ears, wax buildup, runny nose, nasal congestion, bloody nose, or sore throat. She has a history of decline in ability to taste which dates back several years. She thinks this has progressed. She has undergone evaluation per ENT in [place], has not recently seen ENT or a dentist.  Respiratory: Denies difficulty breathing, shortness of breath, cough or sputum production.   Cardiovascular: Denies chest pain, chest tightness, palpitations or swelling in the hands or feet.  Gastrointestinal: Denies abdominal pain, bloating, constipation, or blood in the stool.  GU: Denies urgency, frequency, pain with urination, burning sensation, blood in urine, odor or discharge. Musculoskeletal: Working with physical therapy for chronic lower back and hip pain on left side.   Skin: Denies redness, rashes, lesions or ulcercations.  Neurological: Denies dizziness, difficulty with memory, difficulty with speech or problems with balance and coordination.  Psychiatric: Denies concerns for anxiety or depression.   No other specific complaints in a complete review of systems (except as listed in HPI above).  Objective:  PE:   There were no vitals taken for this visit. Wt Readings from Last 3 Encounters:  07/14/17 179 lb 8 oz (81.4 kg)  06/18/17 182 lb 4 oz (82.7 kg)  05/26/17 182 lb 3.2 oz (82.6 kg)    General: Appears their stated age, well developed, well nourished in NAD. Skin: Warm, dry and intact. No rashes, lesions or ulcerations noted. HEENT: Head: normal shape and size; Eyes: sclera white, no icterus, conjunctiva pink, PERRLA and EOMs intact; Ears: Tm's gray and intact, normal light reflex; Nose:  mucosa pink and moist, septum midline; Throat/Mouth: Teeth present, mucosa pink and moist, no exudate, lesions or ulcerations noted.  Neck: Normal range of motion. Neck supple, trachea midline. No massses, lumps or thyromegaly present.  Cardiovascular: Normal rate and rhythm. S1,S2 noted.  No murmur, rubs or gallops noted. No JVD or BLE edema. No carotid bruits noted. Pulmonary/Chest: Normal effort and positive vesicular breath sounds. No respiratory distress. No wheezes, rales or ronchi noted.  Abdomen: Soft and nontender. Normal bowel sounds, no bruits noted. No distention or masses noted. Liver, spleen and kidneys non palpable. Musculoskeletal: Normal range of motion. No signs of joint swelling. No difficulty with gait.  Neurological: Alert and oriented. Cranial nerves II-XII intact. Coordination normal. +DTRs bilaterally. Psychiatric: Mood and affect normal. Behavior is normal. Judgment and thought content normal.   EKG: Sinus Bradycardia, no T-wave inversion, no ST-elevation/depression, no BBB, PAC/PVC. No old ECG to compare.  BMET    Component Value Date/Time   NA 141 04/21/2017 0855   K 4.1 04/21/2017 0855   CL 104 04/21/2017 0855   CO2 32 04/21/2017 0855   GLUCOSE 96 04/21/2017 0855   BUN 18 04/21/2017 0855   CREATININE 0.98 04/21/2017 0855   CALCIUM 9.2 04/21/2017 0855    Lipid Panel  No results found for: CHOL, TRIG, HDL, CHOLHDL, VLDL, LDLCALC  CBC No results found for: WBC, RBC, HGB, HCT, PLT, MCV, MCH, MCHC, RDW, LYMPHSABS, MONOABS, EOSABS, BASOSABS  Hgb A1C No results found for: HGBA1C    Assessment and Plan:   Medicare Annual Wellness Visit:  Diet: She endorses a fair diet. Breakfast: Skips Lunch: Salad, hot dog, hamburger; restaurants 5 days weekly  Dinner: Meat, applesauce, salad, yogurt Snacks: None Desserts: Daily, small portions  Beverages: Un-sweet tea, coffee occasionally, water, soda with alcohol Physical activity: She is active, has recently  resumed swimming 3 days weekly  Depression/mood screen: Negative Hearing: Intact to whispered voice Visual acuity: Grossly normal, performs annual eye exam  ADLs: Capable Fall risk: None Home safety: Good Cognitive evaluation: Intact to orientation, naming, recall and repetition EOL planning: Adv directives, full code/ I agree  Preventative Medicine: Prevnar due, Rx provided. Tetanus, Shingles UTD. Colonoscopy UTD. Mammogram due, she would like to defer until next year. Bone density due, ordered. Recommended continued exercise and to work on diet. Advanced directives completed per patient. Exam unremarkable. Labs pending. All recommendations provided at end of visit.   Next appointment: One year

## 2017-10-05 NOTE — Assessment & Plan Note (Addendum)
Taking levothyroxine at bedtime, discussed appropriate administration time and instructions. Last TSH was normal, if pending TSH is normal then will continue to monitor. She does take her Prevacid at the opposite time of day.  Potential slight enlargement of thyroid gland, ultrasound ordered.

## 2017-10-05 NOTE — Assessment & Plan Note (Deleted)
Using sumatriptan 25 mg 6-7 times monthly on average. Taking gabapentin 200 mg HS, everynight for migraine/headache prevention. Using amitriptyline sparingly if sumatriptan doesn't help.    This regimen works well for migraine and headache prevention. History of headaches/migraines since the age of 53.  Refills sent to pharmacy.

## 2017-10-05 NOTE — Assessment & Plan Note (Signed)
Compliant to vitamin D 3000 units daily, continue same. Bone density scan pending.

## 2017-10-05 NOTE — Assessment & Plan Note (Signed)
Doing well on Prevacid, continue same. 

## 2017-10-05 NOTE — Patient Instructions (Addendum)
Stop by the lab prior to leaving today. I will notify you of your results once received.   Take the pneumonia vaccination to your pharmacy for administration.  You will be contacted regarding your referral to the dentist and eye doctor, also for your ultrasound.  Please let us know if you have not been contacted within one week.   Continue exercising. You should be getting 150 minutes of moderate intensity exercise weekly.  Increase vegetables, fruit, whole grains, water, lean protein.  Ensure you are consuming 64 ounces of water daily.  Follow up in 1 year for your annual exam or sooner if needed.  It was a pleasure to see you today!   Preventive Care 12 Years and Older, Female Preventive care refers to lifestyle choices and visits with your health care provider that can promote health and wellness. What does preventive care include?  A yearly physical exam. This is also called an annual well check.  Dental exams once or twice a year.  Routine eye exams. Ask your health care provider how often you should have your eyes checked.  Personal lifestyle choices, including: ? Daily care of your teeth and gums. ? Regular physical activity. ? Eating a healthy diet. ? Avoiding tobacco and drug use. ? Limiting alcohol use. ? Practicing safe sex. ? Taking low-dose aspirin every day. ? Taking vitamin and mineral supplements as recommended by your health care provider. What happens during an annual well check? The services and screenings done by your health care provider during your annual well check will depend on your age, overall health, lifestyle risk factors, and family history of disease. Counseling Your health care provider may ask you questions about your:  Alcohol use.  Tobacco use.  Drug use.  Emotional well-being.  Home and relationship well-being.  Sexual activity.  Eating habits.  History of falls.  Memory and ability to understand (cognition).  Work and  work Statistician.  Reproductive health.  Screening You may have the following tests or measurements:  Height, weight, and BMI.  Blood pressure.  Lipid and cholesterol levels. These may be checked every 5 years, or more frequently if you are over 53 years old.  Skin check.  Lung cancer screening. You may have this screening every year starting at age 20 if you have a 30-pack-year history of smoking and currently smoke or have quit within the past 15 years.  Fecal occult blood test (FOBT) of the stool. You may have this test every year starting at age 41.  Flexible sigmoidoscopy or colonoscopy. You may have a sigmoidoscopy every 5 years or a colonoscopy every 10 years starting at age 6.  Hepatitis C blood test.  Hepatitis B blood test.  Sexually transmitted disease (STD) testing.  Diabetes screening. This is done by checking your blood sugar (glucose) after you have not eaten for a while (fasting). You may have this done every 1-3 years.  Bone density scan. This is done to screen for osteoporosis. You may have this done starting at age 83.  Mammogram. This may be done every 1-2 years. Talk to your health care provider about how often you should have regular mammograms.  Talk with your health care provider about your test results, treatment options, and if necessary, the need for more tests. Vaccines Your health care provider may recommend certain vaccines, such as:  Influenza vaccine. This is recommended every year.  Tetanus, diphtheria, and acellular pertussis (Tdap, Td) vaccine. You may need a Td booster every 10 years.  Varicella vaccine. You may need this if you have not been vaccinated.  Zoster vaccine. You may need this after age 77.  Measles, mumps, and rubella (MMR) vaccine. You may need at least one dose of MMR if you were born in 1957 or later. You may also need a second dose.  Pneumococcal 13-valent conjugate (PCV13) vaccine. One dose is recommended after age  8.  Pneumococcal polysaccharide (PPSV23) vaccine. One dose is recommended after age 71.  Meningococcal vaccine. You may need this if you have certain conditions.  Hepatitis A vaccine. You may need this if you have certain conditions or if you travel or work in places where you may be exposed to hepatitis A.  Hepatitis B vaccine. You may need this if you have certain conditions or if you travel or work in places where you may be exposed to hepatitis B.  Haemophilus influenzae type b (Hib) vaccine. You may need this if you have certain conditions.  Talk to your health care provider about which screenings and vaccines you need and how often you need them. This information is not intended to replace advice given to you by your health care provider. Make sure you discuss any questions you have with your health care provider. Document Released: 03/29/2015 Document Revised: 11/20/2015 Document Reviewed: 01/01/2015 Elsevier Interactive Patient Education  [employer].

## 2017-10-06 ENCOUNTER — Encounter: Payer: Self-pay | Admitting: *Deleted

## 2017-10-26 ENCOUNTER — Telehealth: Payer: Self-pay | Admitting: Primary Care

## 2017-10-26 ENCOUNTER — Other Ambulatory Visit: Payer: Self-pay | Admitting: Primary Care

## 2017-10-26 ENCOUNTER — Ambulatory Visit
Admission: RE | Admit: 2017-10-26 | Discharge: 2017-10-26 | Disposition: A | Discharge status: Home / Self Care | Payer: Medicare Other | Source: Ambulatory Visit | Attending: Primary Care | Admitting: Primary Care

## 2017-10-26 DIAGNOSIS — E039 Hypothyroidism, unspecified: Secondary | ICD-10-CM

## 2017-10-26 DIAGNOSIS — E041 Nontoxic single thyroid nodule: Secondary | ICD-10-CM | POA: Insufficient documentation

## 2017-10-26 NOTE — Telephone Encounter (Signed)
Appointment 9/10 Pt aware

## 2017-10-26 NOTE — Telephone Encounter (Signed)
Copied from [place] [id number]. Topic: Quick Communication - See Telephone Encounter >> Oct 26, 2017  1:36 PM Rutherford Nail, [place] wrote: CRM for notification. See Telephone encounter for: 10/26/17. Patient calling and states that someone called her regarding her bone density scan. Patient has not heard anything else in regards to that. Would like to know if she was able to get her bone density scheduled? If so, when? Please advise.  CB#: [phone]

## 2017-11-23 ENCOUNTER — Ambulatory Visit
Admission: RE | Admit: 2017-11-23 | Discharge: 2017-11-23 | Disposition: A | Discharge status: Home / Self Care | Payer: Medicare Other | Source: Ambulatory Visit | Attending: Primary Care | Admitting: Primary Care

## 2017-11-23 DIAGNOSIS — E2839 Other primary ovarian failure: Secondary | ICD-10-CM | POA: Insufficient documentation

## 2017-11-25 ENCOUNTER — Ambulatory Visit: Payer: Medicare Other | Admitting: Primary Care

## 2017-11-25 ENCOUNTER — Encounter: Payer: Self-pay | Admitting: Primary Care

## 2017-11-25 ENCOUNTER — Ambulatory Visit (INDEPENDENT_AMBULATORY_CARE_PROVIDER_SITE_OTHER): Payer: Medicare Other | Admitting: Primary Care

## 2017-11-25 VITALS — BP 134/84 | HR 57 | Temp 97.8°F | Ht 67.5 in | Wt 178.5 lb

## 2017-11-25 DIAGNOSIS — Z23 Encounter for immunization: Secondary | ICD-10-CM | POA: Diagnosis not present

## 2017-11-25 DIAGNOSIS — K219 Gastro-esophageal reflux disease without esophagitis: Secondary | ICD-10-CM

## 2017-11-25 DIAGNOSIS — R197 Diarrhea, unspecified: Secondary | ICD-10-CM | POA: Insufficient documentation

## 2017-11-25 LAB — CBC WITH DIFFERENTIAL/PLATELET
Basophils Absolute: 0 10*3/uL (ref 0.0–0.1)
Basophils Relative: 0.7 % (ref 0.0–3.0)
Eosinophils Absolute: 0.1 10*3/uL (ref 0.0–0.7)
Eosinophils Relative: 2.6 % (ref 0.0–5.0)
HCT: 44.8 % (ref 36.0–46.0)
Hemoglobin: 15.5 g/dL — ABNORMAL HIGH (ref 12.0–15.0)
Lymphocytes Relative: 40.2 % (ref 12.0–46.0)
Lymphs Abs: 2.1 10*3/uL (ref 0.7–4.0)
MCHC: 34.6 g/dL (ref 30.0–36.0)
MCV: 91.5 fl (ref 78.0–100.0)
Monocytes Absolute: 0.4 10*3/uL (ref 0.1–1.0)
Monocytes Relative: 8.2 % (ref 3.0–12.0)
Neutro Abs: 2.5 10*3/uL (ref 1.4–7.7)
Neutrophils Relative %: 48.3 % (ref 43.0–77.0)
Platelets: 181 10*3/uL (ref 150.0–400.0)
RBC: 4.9 Mil/uL (ref 3.87–5.11)
RDW: 13.7 % (ref 11.5–15.5)
WBC: 5.2 10*3/uL (ref 4.0–10.5)

## 2017-11-25 LAB — COMPREHENSIVE METABOLIC PANEL
ALT: 10 U/L (ref 0–35)
AST: 13 U/L (ref 0–37)
Albumin: 4.2 g/dL (ref 3.5–5.2)
Alkaline Phosphatase: 89 U/L (ref 39–117)
BUN: 23 mg/dL (ref 6–23)
CO2: 30 mEq/L (ref 19–32)
Calcium: 9.4 mg/dL (ref 8.4–10.5)
Chloride: 102 mEq/L (ref 96–112)
Creatinine, Ser: 1.11 mg/dL (ref 0.40–1.20)
GFR: 52.29 mL/min — ABNORMAL LOW (ref >60.00)
Glucose, Bld: 89 mg/dL (ref 70–99)
Potassium: 4 mEq/L (ref 3.5–5.1)
Sodium: 140 mEq/L (ref 135–145)
Total Bilirubin: 1.2 mg/dL (ref 0.2–1.2)
Total Protein: 6.8 g/dL (ref 6.0–8.3)

## 2017-11-25 MED ORDER — RANITIDINE HCL 150 MG PO TABS: 150.0000 mg | ORAL_TABLET | Freq: Every day | ORAL | 0 refills | Status: DC

## 2017-11-25 NOTE — Patient Instructions (Signed)
Stop by the lab prior to leaving today. I will notify you of your results once received.   Continue Prevacid once daily. Add on ranitidine (Zantac) at opposite time of day.   Limit fried/fatty foods, acidic foods, alcohol, chocolate, etc for now.  Continue Imodium as needed.  It was a pleasure to see you today!

## 2017-11-25 NOTE — Addendum Note (Signed)
Addended by: Ellamae Sia on: 11/25/2017 02:27 PM   Modules accepted: Orders

## 2017-11-25 NOTE — Assessment & Plan Note (Signed)
Symptoms of nausea and epigastric discomfort could be secondary to uncontrolled GERD. Continue Prevacid, add in Zantac at opposite time of day. Discussed to avoid trigger foods.  Consider abdominal ultrasound if no improvement to rule out any potential gall bladder involvement.

## 2017-11-25 NOTE — Assessment & Plan Note (Addendum)
Since late July, now stools are soft rather than watery. Doubt infectious cause but will rule out with stool studies and CBC with diff. Check CMP as well.  Could be IBS. Will have her watch diet. Consider abdominal ultrasound for continued symptoms despite treatment and if normal work up.  She appears well today.

## 2017-11-25 NOTE — Progress Notes (Signed)
Subjective:    Patient ID: Christie Wells, female    DOB: 04-07-1950, 67 y.o.   MRN: [id number]  HPI  Christie Wells is a 67 year old female who presents today with a chief complaint of nausea and diarrhea.   Diarrhea has been present since July 23rd which was watery and occurring daily. Nausea began August 19th when coming back from [place]. She had vomiting and diarrhea on August 6th while in [place], thinks this was food poisoning. She also reports epigastric discomfort.   She denies belching on a normal basis but when eating she feels like she has to belch in order to get her food down. She experiences her nausea in the morning if she touches the epigastric region, drinks, eats. She can hear "gurgling" when she eats.   She's been taking Gas-X which has helped with the nausea. She took 1 Imodium one week ago, 2 Imodium tables on Sunday this week improvement. She has had soft light brown stools since her initial imodium dose one week ago.   She denies bloody stools, throat fullness, esophageal burning, chills, fevers, decrease in appetite, recent antibiotic use. She is complaint to her Prevacid.    Review of Systems  Constitutional: Negative for appetite change, chills, fatigue and fever.  Gastrointestinal: Positive for diarrhea and nausea. Negative for vomiting.       Epigastric discomfort  Skin: Negative for color change.  Neurological: Negative for weakness.       Past Medical History:  Diagnosis Date  . GERD (gastroesophageal reflux disease)   . Hypothyroidism   . Migraines      Social History   Socioeconomic History  . Marital status: Single    Spouse name: Not on file  . Number of children: Not on file  . Years of education: Not on file  . Highest education level: Not on file  Occupational History  . Not on file  Social Needs  . Financial resource strain: Not on file  . Food insecurity:    Worry: Not on file    Inability: Not on file  . Transportation  needs:    Medical: Not on file    Non-medical: Not on file  Tobacco Use  . Smoking status: Never Smoker  . Smokeless tobacco: Never Used  Substance and Sexual Activity  . Alcohol use: Yes    Comment: occ  . Drug use: No  . Sexual activity: Not on file  Lifestyle  . Physical activity:    Days per week: Not on file    Minutes per session: Not on file  . Stress: Not on file  Relationships  . Social connections:    Talks on phone: Not on file    Gets together: Not on file    Attends religious service: Not on file    Active member of club or organization: Not on file    Attends meetings of clubs or organizations: Not on file    Relationship status: Not on file  . Intimate partner violence:    Fear of current or ex partner: Not on file    Emotionally abused: Not on file    Physically abused: Not on file    Forced sexual activity: Not on file  Other Topics Concern  . Not on file  Social History Narrative   Divorced.   2 children.   Moved from [place].   Once worked as an Journalist, newspaper.        Past Surgical History:  Procedure Laterality Date  . BUNIONECTOMY Bilateral   . COLONOSCOPY    . EXCISION OF BREAST BIOPSY Left   . TUBAL LIGATION    . UPPER GI ENDOSCOPY     Gastritis    Family History  Problem Relation Age of Onset  . Alzheimer's disease Mother   . Hypertension Mother   . Hyperlipidemia Mother   . COPD Mother   . Stroke Father   . Hypertension Father   . Heart disease Maternal Grandmother   . Parkinson's disease Paternal Grandfather     Allergies  Allergen Reactions  . Codeine Nausea And Vomiting    Other reaction(s): vomiting   . Peanut-Containing Drug Products     Headaches  . Sulfa Antibiotics Rash    Other reaction(s): rash    Current Outpatient Medications on File Prior to Visit  Medication Sig Dispense Refill  . amitriptyline (ELAVIL) 10 MG tablet Take 0.5-1 tablets by mouth daily as needed.  1  . cholecalciferol (VITAMIN D)  1000 units tablet Take 3,000 Units by mouth daily.    . cyanocobalamin 1000 MCG tablet Take 1,000 mcg by mouth daily.    Marland Kitchen gabapentin (NEURONTIN) 100 MG capsule Take 2 capsules (200 mg total) by mouth at bedtime. 180 capsule 3  . lansoprazole (PREVACID) 30 MG capsule Take 1 capsule (30 mg total) by mouth daily. 90 capsule 3  . levothyroxine (SYNTHROID, LEVOTHROID) 100 MCG tablet TAKE 1 TABLET BY MOUTH EVERY MORNING ON AN EMPTY STOMACH WITH A FULL GLASS OF WATER. 90 tablet 1  . meloxicam (MOBIC) 15 MG tablet Take 1 tablet by mouth once daily as needed for pain. 30 tablet 1  . SUMAtriptan (IMITREX) 50 MG tablet Take 0.5-1 tablets by mouth daily as needed.     No current facility-administered medications on file prior to visit.     BP 134/84   Pulse (!) 57   Temp 97.8 F (36.6 C) (Oral)   Ht 5' 7.5" (1.715 m)   Wt 178 lb 8 oz (81 kg)   SpO2 97%   BMI 27.54 kg/m    Objective:   Physical Exam  Constitutional: She appears well-nourished. She does not have a sickly appearance. She does not appear ill.  Cardiovascular: Normal rate and regular rhythm.  Respiratory: Effort normal and breath sounds normal.  GI: Soft. Bowel sounds are normal. There is tenderness in the right lower quadrant. There is no rebound and no guarding.    Skin: Skin is warm and dry.           Assessment & Plan:

## 2017-11-26 NOTE — Addendum Note (Signed)
Addended by: Lendon Collar on: 11/26/2017 11:47 AM   Modules accepted: Orders

## 2017-11-29 LAB — GASTROINTESTINAL PATHOGEN PANEL PCR
C. difficile Tox A/B, PCR: NOT DETECTED
Campylobacter, PCR: NOT DETECTED
Cryptosporidium, PCR: NOT DETECTED
E coli (ETEC) LT/ST PCR: NOT DETECTED
E coli (STEC) stx1/stx2, PCR: NOT DETECTED
E coli 0157, PCR: NOT DETECTED
Giardia lamblia, PCR: NOT DETECTED
Norovirus, PCR: NOT DETECTED
Rotavirus A, PCR: NOT DETECTED
Salmonella, PCR: NOT DETECTED
Shigella, PCR: NOT DETECTED

## 2017-11-30 DIAGNOSIS — R1013 Epigastric pain: Secondary | ICD-10-CM

## 2017-11-30 DIAGNOSIS — R197 Diarrhea, unspecified: Secondary | ICD-10-CM

## 2017-12-02 ENCOUNTER — Telehealth: Payer: Self-pay | Admitting: Primary Care

## 2017-12-02 NOTE — Telephone Encounter (Signed)
Pt dropped off form to be filled out for Health Examination Certificate. Put in [employer].

## 2017-12-03 ENCOUNTER — Encounter: Payer: Self-pay | Admitting: Primary Care

## 2017-12-03 NOTE — Telephone Encounter (Signed)
Place form/paperwork in Kate Clark's inbox for review and complete if necessary.  

## 2017-12-03 NOTE — Telephone Encounter (Signed)
Looks like she is needing a TB skin test. Form completed and signed, pending TB skin test result. Placed in Chan's in box.

## 2017-12-06 NOTE — Telephone Encounter (Signed)
Spoken and notified patient of Christie Wells comments. Patient verbalized understanding.  Nurse appt on 12/08/2017

## 2017-12-08 ENCOUNTER — Ambulatory Visit
Admission: RE | Admit: 2017-12-08 | Discharge: 2017-12-08 | Disposition: A | Discharge status: Home / Self Care | Payer: Medicare Other | Source: Ambulatory Visit | Attending: Primary Care | Admitting: Primary Care

## 2017-12-08 ENCOUNTER — Ambulatory Visit (INDEPENDENT_AMBULATORY_CARE_PROVIDER_SITE_OTHER): Payer: Medicare Other

## 2017-12-08 DIAGNOSIS — R197 Diarrhea, unspecified: Secondary | ICD-10-CM

## 2017-12-08 DIAGNOSIS — Z111 Encounter for screening for respiratory tuberculosis: Secondary | ICD-10-CM

## 2017-12-08 DIAGNOSIS — K8020 Calculus of gallbladder without cholecystitis without obstruction: Secondary | ICD-10-CM | POA: Diagnosis not present

## 2017-12-08 DIAGNOSIS — R1013 Epigastric pain: Secondary | ICD-10-CM | POA: Insufficient documentation

## 2017-12-08 IMAGING — US US ABDOMEN COMPLETE
1 series · 14 of 25 positions shown · non-contrast
Comparison: None.

CLINICAL DATA: Epigastric pain for 2 months.

EXAM:
ABDOMEN ULTRASOUND COMPLETE

[Series 1: us abdomen complete · 14 of 126 slices shown]
[im 1/126]
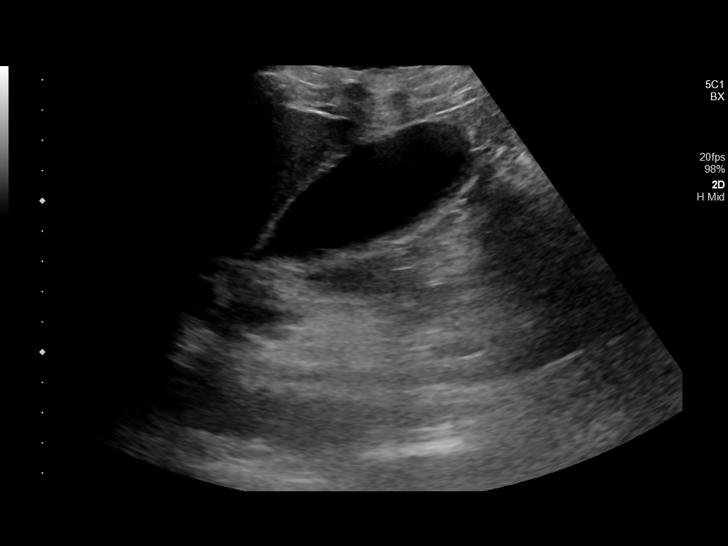
[im 11/126]
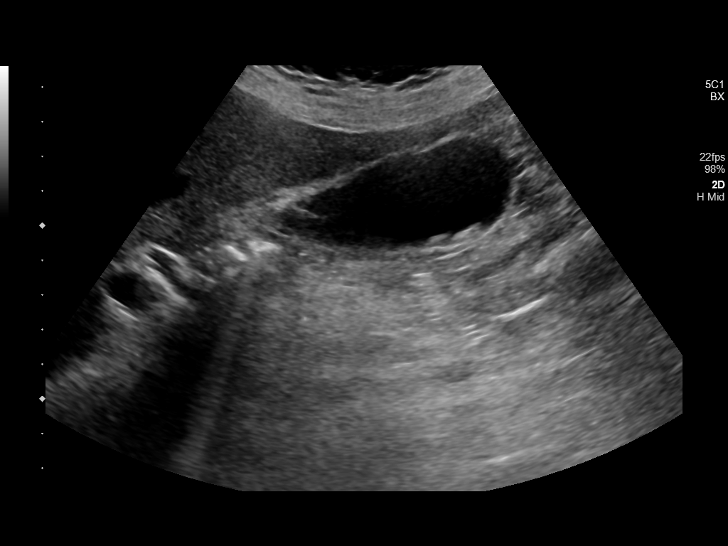
[im 21/126]
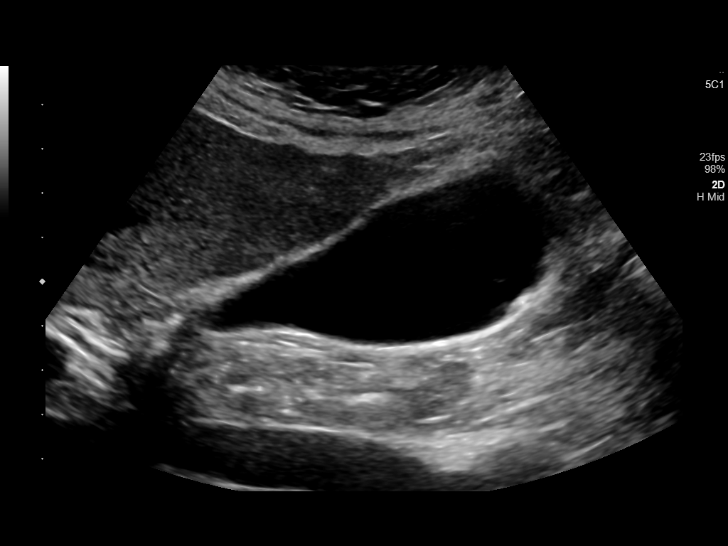
[im 32/126]
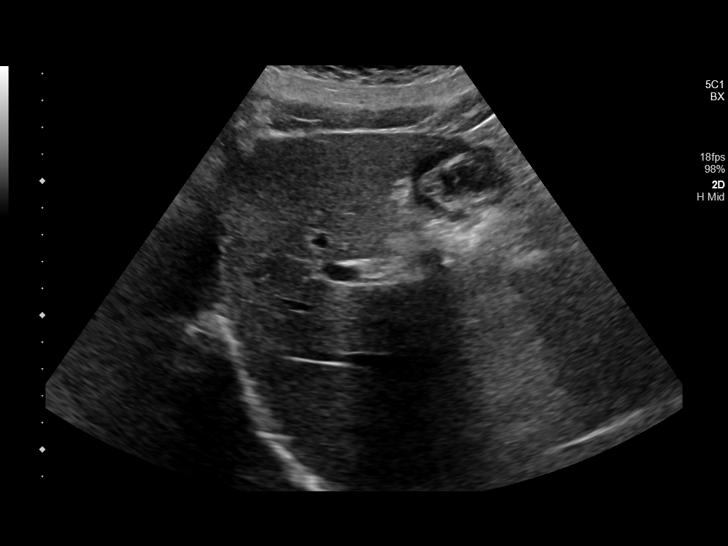
[im 42/126]
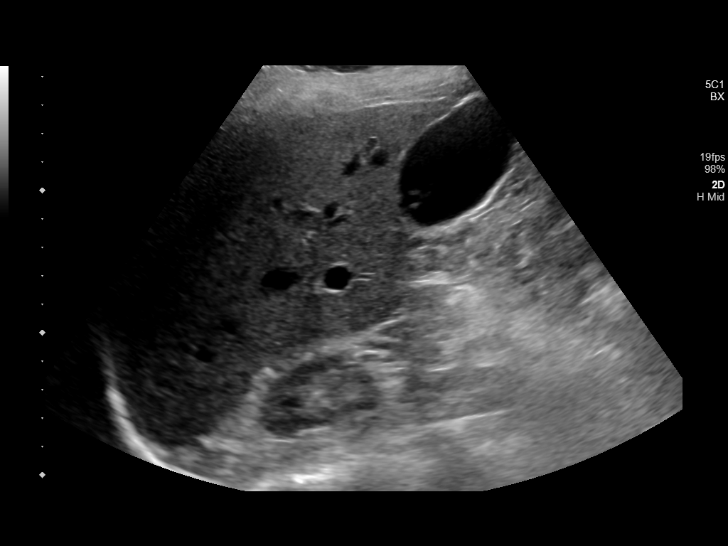
[im 47/126]
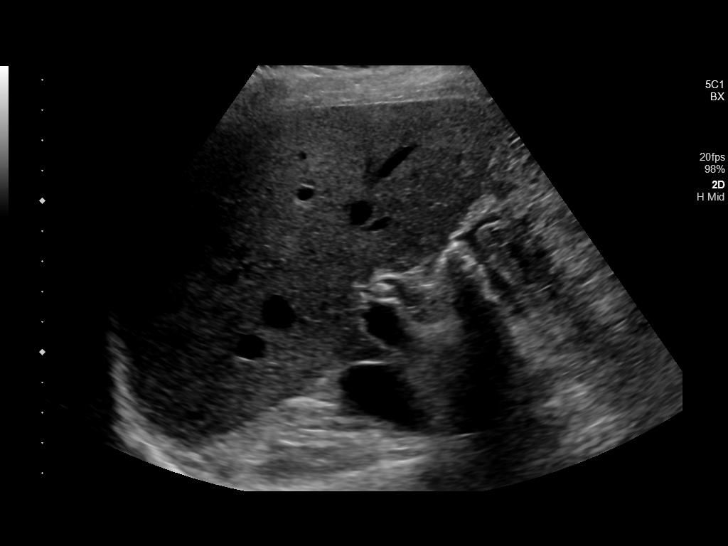
[im 58/126]
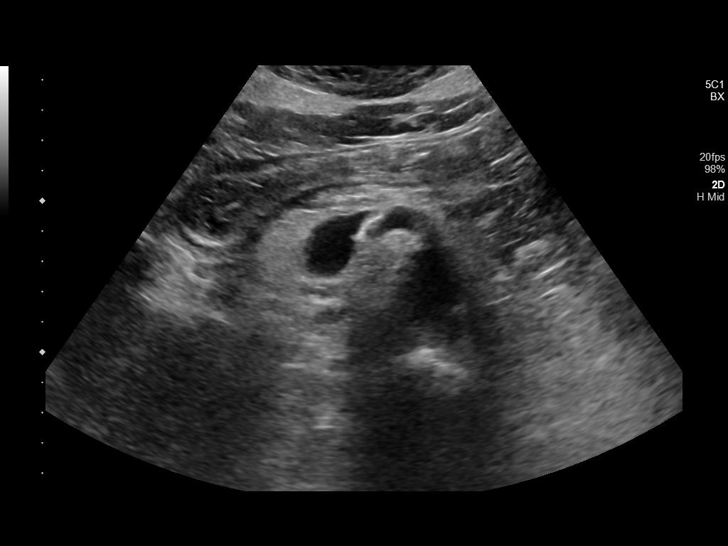
[im 68/126]
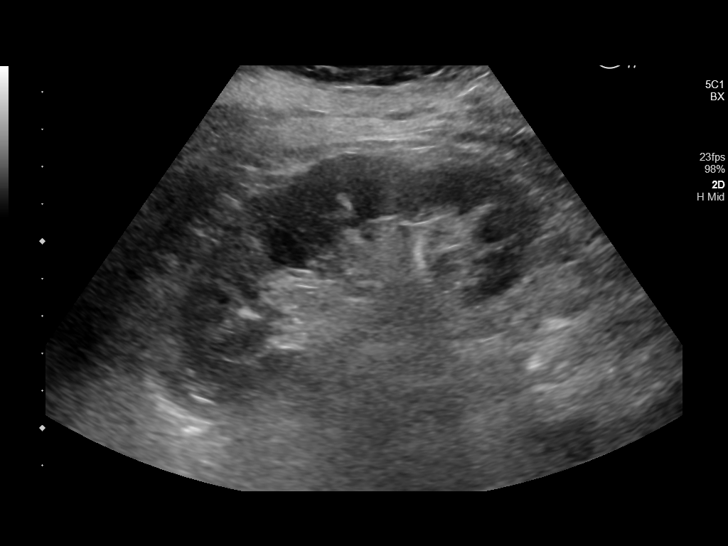
[im 79/126]
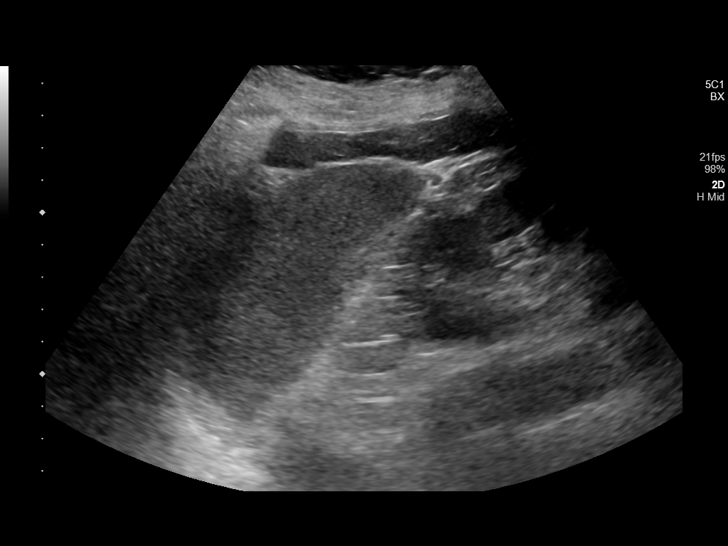
[im 84/126]
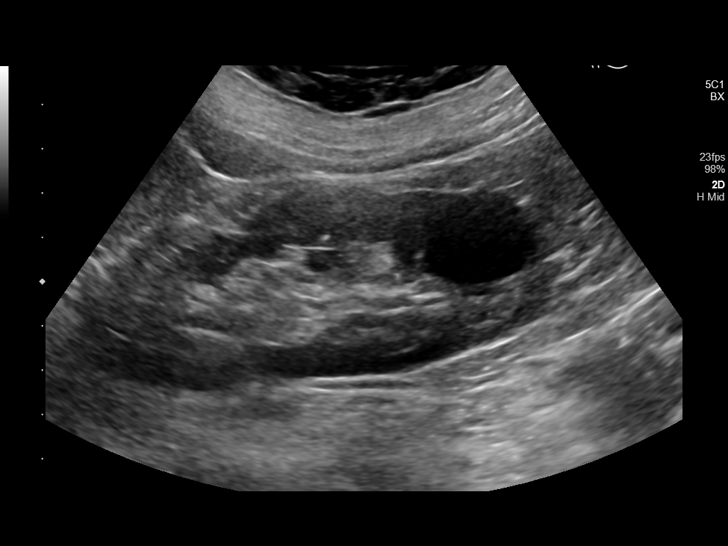
[im 94/126]
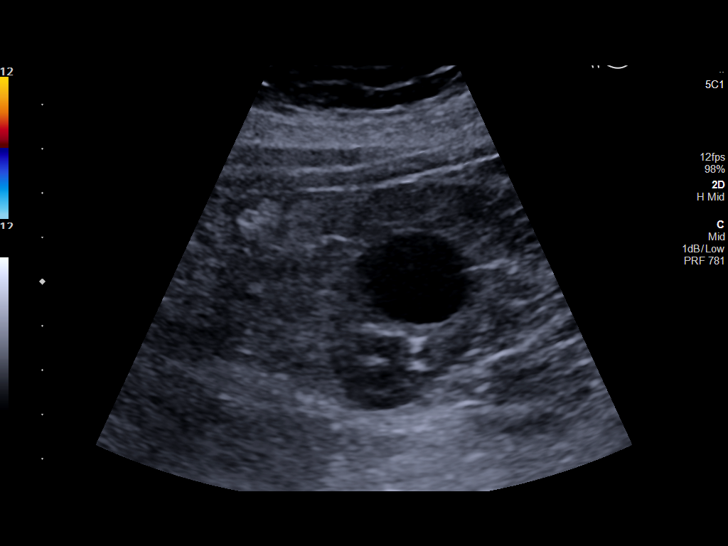
[im 105/126]
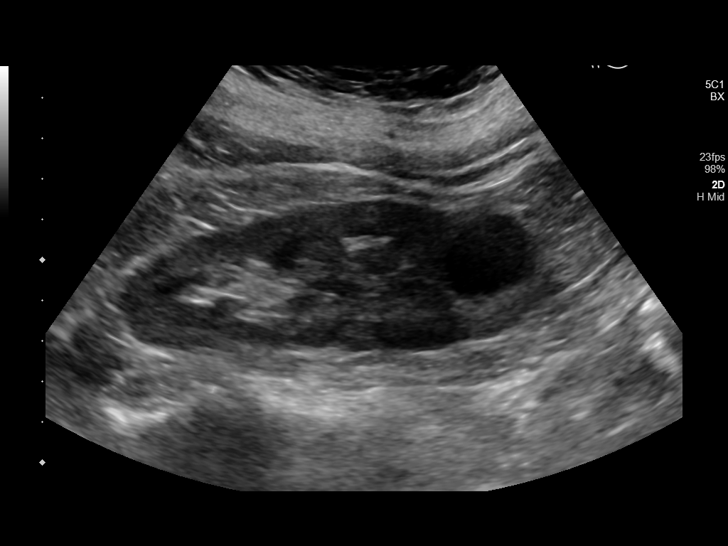
[im 115/126]
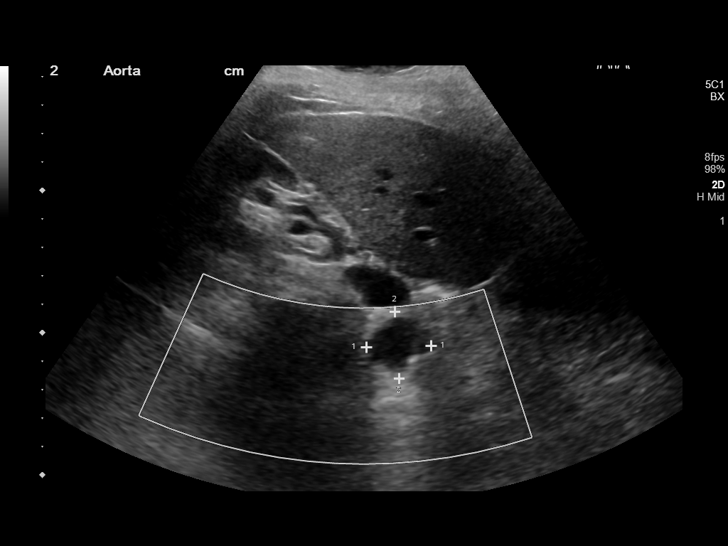
[im 126/126]
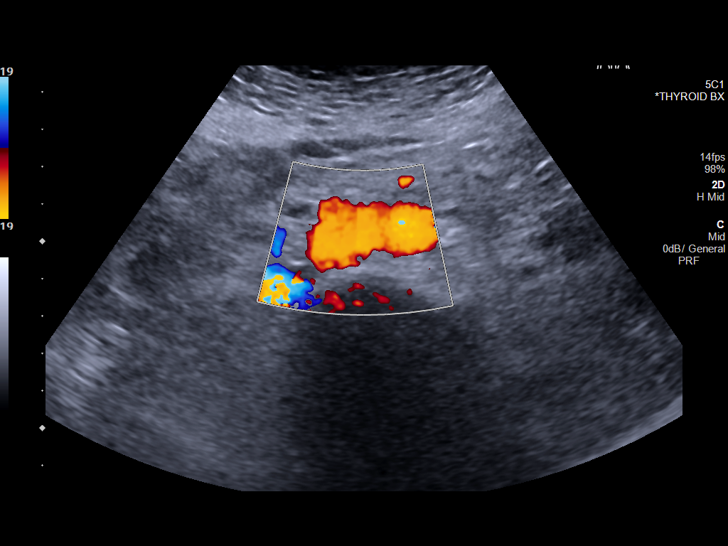

[14 of 25 positions shown; findings below may reference images not displayed]

FINDINGS: Gallbladder: Multiple gallstones are noted with the largest
measuring 6 mm. No gallbladder wall thickening or pericholecystic
fluid is noted.

Common bile duct: Diameter: 6.8 mm which is within normal limits.

Liver: No focal lesion identified. Within normal limits in
parenchymal echogenicity. Portal vein is patent on color Doppler
imaging with normal direction of blood flow towards the liver.

IVC: No abnormality visualized.

Pancreas: Visualized portion unremarkable.

Spleen: Size and appearance within normal limits.

Right Kidney: Length: 10.7 cm. Echogenicity within normal limits. No
mass or hydronephrosis visualized.

Left Kidney: Length: 11.3 cm. 2.7 cm simple cyst is seen in lower
pole. Echogenicity within normal limits. No mass or hydronephrosis
visualized.

Abdominal aorta: No aneurysm visualized.

Other findings: None.
IMPRESSION: Cholelithiasis without inflammation. No other abnormality seen in
the abdomen.

## 2017-12-10 LAB — TB SKIN TEST
Induration: 0 mm
TB Skin Test: NEGATIVE

## 2017-12-28 ENCOUNTER — Other Ambulatory Visit: Payer: Self-pay | Admitting: Primary Care

## 2017-12-28 DIAGNOSIS — R197 Diarrhea, unspecified: Secondary | ICD-10-CM

## 2017-12-31 ENCOUNTER — Encounter: Payer: Self-pay | Admitting: Gastroenterology

## 2018-01-17 ENCOUNTER — Other Ambulatory Visit: Payer: Self-pay | Admitting: Primary Care

## 2018-01-17 DIAGNOSIS — E039 Hypothyroidism, unspecified: Secondary | ICD-10-CM

## 2018-01-27 NOTE — Progress Notes (Signed)
Referring Provider: Pleas Koch, NP Primary Care Physician:  Pleas Koch, NP   Reason for Consultation:  diarrhea   IMPRESSION:  Diarrhea    - infectious stool studies were negative Dysphagia at the level of the sternal notch Cholelithiasis on ultrasound 12/08/2017 Recent use of NSAIDs for headaches History of colon polyps    -Last colonoscopy 12/29/2013    -Surveillance colonoscopy recommended 12/2018  The differential diagnosis of chronic diarrhea without alarm features includes: irritable bowel syndrome, IBD, celiac disease, food intolerance,microscopic colitis, small bowel bacterial overgrowth, other functional GI disease. By history, this is less likely to be obstruction.   History of GERD with dysphagia localized to the sternal notch. EGD recommended to evaluate for esophagitis, ring, web, stricture, eosinophilic esophagitis, and GERD-related dysmotility.   PLAN: - Fecal calprotectin, ESR, and CRP to screen for IBD - IgA tissue transglutaminase, IgA level to test for celiac disease - EGD with duodenal biopsies given the patient's prior medical care - Colonoscopy with random biopsies and evaluation of the terminal ileum - Discontinue ranitidine - Continue to use Imodium as needed for diarrhea - Office visit 2 weeks after the endoscopy  I consented the patient at the bedside today discussing the risks, benefits, and alternatives to endoscopic evaluation. In particular, we discussed the risks that include, but are not limited to, reaction to medication, cardiopulmonary compromise, bleeding requiring blood transfusion, aspiration resulting in pneumonia, perforation requiring surgery, and even death. We reviewed the risk of missed lesion including polyps or even cancer. The patient acknowledges these risks and asks that we proceed.    HPI: Christie Wells is a 67 y.o. female seen in consultation at the request of NP Clark for further evaluation of diarrhea.  The  history is obtained through the patient and review of her electronic health record.  She has arthritis, chronic headaches, gastroesophageal reflux disease.  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  Recently moved from [place]. Getting established in the medical community here.   Longstanding BM every morning. Became diarrhea in early July. Associated epigastric abdominal pain and early morning nausea. Increased eructation with gas cramps. Food sticking at the sternal notch and wouldn't pass without eructation. Longstanding history of reflux, worse since July. Triggered by some foods including carrots. Eating would improve the nausea as long as it was "lighter." No odynophagia. No blood or mucous in the stool. Weight stable.   Three weeks ago developed post-prandial diarrhea with urgency. Develops within one hour of eating. Accident at [employer].  Must use Imodium. No bood or mucous.   Symptoms are similar to those in 1984 when she had colitis. No other associated symptoms. No identified exacerbating or relieving features.   Labs from 11/25/2017 show a normal complaints of metabolic panel with a potassium of 4.0 creatinine 1.11. CBC was normal with a white count of 5.2, hemoglobin 15.5, platelets 181.  TSH was normal in July 2019 at 0.62.  Stool studies 11/26/2017 showed negative infectious evaluation including Campylobacter, C. difficile, Cryptosporidium, E. coli, Giardia, norovirus, rotavirus, Salmonella, and Shigella.  She had an abdominal ultrasound 12/08/2017 for a 58-monthhistory of epigastric pain.  This showed multiple gallstones in the gallbladder with the largest measuring 6 mm.  The common bile duct was normal.  The liver appeared normal.  No other abnormalities were seen.  [facility] for a personal history of colon polyps.  Nonbleeding internal hemorrhoids were  identified.  Surveillance colonoscopy recommended in 5 years. GERD controlled on lansoprazole.   She stopped using NSAIDs which she has been taking for headaches. Trial of ranitidine provided no relief.   Sister in law had pancreatic cancer that started with similar symptoms. She is concerned about a longstanding stomach problem including stomach cancer with her history of longstanding PPI use.   Past Medical History:  Diagnosis Date  . Arthritis   . Chronic headaches   . Colitis 1984  . GERD (gastroesophageal reflux disease)   . Hypothyroidism   . Migraines   . UTI (urinary tract infection)     Past Surgical History:  Procedure Laterality Date  . BUNIONECTOMY Bilateral   . COLONOSCOPY  2015  . EXCISION OF BREAST BIOPSY Left   . FOOT SURGERY Bilateral    5 screws   . TUBAL LIGATION    . UPPER GI ENDOSCOPY     Gastritis    Current Outpatient Medications  Medication Sig Dispense Refill  . amitriptyline (ELAVIL) 10 MG tablet Take 0.5-1 tablets by mouth daily as needed.  1  . cholecalciferol (VITAMIN D) 1000 units tablet Take 3,000 Units by mouth daily.    . cyanocobalamin 1000 MCG tablet Take 1,000 mcg by mouth daily.    Marland Kitchen gabapentin (NEURONTIN) 100 MG capsule Take 2 capsules (200 mg total) by mouth at bedtime. 180 capsule 3  . lansoprazole (PREVACID) 30 MG capsule Take 1 capsule (30 mg total) by mouth daily. 90 capsule 3  . levothyroxine (SYNTHROID, LEVOTHROID) 100 MCG tablet TAKE 1 TABLET BY MOUTH EVERY MORNING ON EMPTY STOMACH WITH A FULL GLASS OF WATER 90 tablet 1  . meloxicam (MOBIC) 15 MG tablet Take 1 tablet by mouth once daily as needed for pain. 30 tablet 1  . SUMAtriptan (IMITREX) 50 MG tablet Take 0.5-1 tablets by mouth daily as needed.    . ranitidine (ZANTAC) 150 MG tablet Take 1 tablet (150 mg total) by mouth at bedtime. (Patient not taking: Reported on 01/28/2018) 90 tablet 0   No current facility-administered medications for this visit.     Allergies as of  01/28/2018 - Review Complete 01/28/2018  Allergen Reaction Noted  . Codeine Nausea And Vomiting 09/19/2009  . Peanut-containing drug products  04/21/2017  . Sulfa antibiotics Rash 09/19/2009    Family History  Problem Relation Age of Onset  . Alzheimer's disease Mother   . Hypertension Mother   . Hyperlipidemia Mother   . COPD Mother   . Stroke Father   . Hypertension Father   . Heart disease Maternal Grandmother   . Parkinson's disease Paternal Grandfather   . Colon cancer Neg Hx   . Esophageal cancer Neg Hx     Social History   Socioeconomic History  . Marital status: Divorced    Spouse name: Not on file  . Number of children: 2  . Years of education: Not on file  . Highest education level: Not on file  Occupational History  . Not on file  Social Needs  . Financial resource strain: Not on file  . Food insecurity:    Worry: Not on file    Inability: Not on file  . Transportation needs:    Medical: Not on file    Non-medical: Not on file  Tobacco Use  . Smoking status: Never Smoker  . Smokeless tobacco: Never Used  Substance and Sexual Activity  . Alcohol use: Yes    Comment: occ  .  Drug use: No  . Sexual activity: Not on file  Lifestyle  . Physical activity:    Days per week: Not on file    Minutes per session: Not on file  . Stress: Not on file  Relationships  . Social connections:    Talks on phone: Not on file    Gets together: Not on file    Attends religious service: Not on file    Active member of club or organization: Not on file    Attends meetings of clubs or organizations: Not on file    Relationship status: Not on file  . Intimate partner violence:    Fear of current or ex partner: Not on file    Emotionally abused: Not on file    Physically abused: Not on file    Forced sexual activity: Not on file  Other Topics Concern  . Not on file  Social History Narrative   Divorced.   2 children.   Moved from [place].   Once worked as an  Journalist, newspaper.        Review of Systems: 12 system ROS is negative except as noted above with the additions of back pain developing in March, headaches, and excessive urination.   Filed Weights   01/28/18 1518  Weight: 179 lb 4 oz (81.3 kg)    Physical Exam: Vital signs were reviewed. General:   Alert, well-nourished, pleasant and cooperative in NAD Head:  Normocephalic and atraumatic. Eyes:  Sclera clear, no icterus.   Conjunctiva pink. Mouth:  No deformity or lesions.   Neck:  Supple; no thyromegaly. Lungs:  Clear throughout to auscultation.   No wheezes.  Heart:  Regular rate and rhythm; no murmurs Abdomen:  Soft, mildly tender in the epigastrium, normal bowel sounds. No rebound or guarding. No hepatosplenomegaly Rectal:  Deferred  Msk:  Symmetrical without gross deformities. Extremities:  No gross deformities or edema. Neurologic:  Alert and  oriented x4;  grossly nonfocal Skin:  No rash or bruise. Psych:  Alert and cooperative. Normal mood and affect.   Kayn Haymore L. Tarri Glenn Md, MPH [facility] 01/28/2018, 9:02 AM

## 2018-01-28 ENCOUNTER — Ambulatory Visit (INDEPENDENT_AMBULATORY_CARE_PROVIDER_SITE_OTHER): Payer: Medicare Other | Admitting: Gastroenterology

## 2018-01-28 ENCOUNTER — Encounter: Payer: Self-pay | Admitting: Gastroenterology

## 2018-01-28 ENCOUNTER — Other Ambulatory Visit (INDEPENDENT_AMBULATORY_CARE_PROVIDER_SITE_OTHER): Payer: Medicare Other

## 2018-01-28 VITALS — BP 126/82 | HR 60 | Ht 67.75 in | Wt 179.2 lb

## 2018-01-28 DIAGNOSIS — R197 Diarrhea, unspecified: Secondary | ICD-10-CM | POA: Diagnosis not present

## 2018-01-28 DIAGNOSIS — R1310 Dysphagia, unspecified: Secondary | ICD-10-CM

## 2018-01-28 LAB — HIGH SENSITIVITY CRP: CRP, High Sensitivity: 0.65 mg/L (ref 0.000–5.000)

## 2018-01-28 LAB — IGA: IgA: 278 mg/dL (ref 68–378)

## 2018-01-28 LAB — SEDIMENTATION RATE: Sed Rate: 2 mm/hr (ref 0–30)

## 2018-01-28 MED ORDER — NA SULFATE-K SULFATE-MG SULF 17.5-3.13-1.6 GM/177ML PO SOLN: 1.0000 | Freq: Once | ORAL | 0 refills | Status: AC

## 2018-01-28 NOTE — Patient Instructions (Addendum)
If you are age 66 or older, your body mass index should be between 23-30. Your Body mass index is 27.46 kg/m. If this is out of the aforementioned range listed, please consider follow up with your Primary Care Provider.  If you are age 58 or younger, your body mass index should be between 19-25. Your Body mass index is 27.46 kg/m. If this is out of the aformentioned range listed, please consider follow up with your Primary Care Provider.   Your provider has requested that you go to the basement level for lab work before leaving today. Press "B" on the elevator. The lab is located at the first door on the left as you exit the elevator.  Continue to use Imodium as needed for diarrhea.  I am recommending some additional blood tests and a stool test.  The colonoscopy and upper endoscopy are recommended.   Tips for colonoscopy:  -STAY WELL HYDRATED FOR 3-4 DAYS PRIOR TO THE EXAM. This reduces nausea and dehydration.  -TO PREVENT SKIN/HEMORRHOID IRRITATION- prior to wiping, put A&Dointment or vaseline on the toilet paper. -Keep a towel or pad on the bed.  -DRINK 64oz of clear liquids in the morning of prep day (PRIOR TO STARTING THE PREP) to be sure that there is enough fluid to flush the colon and stay hydrated!!!! This is in addition to the fluids required for preparation.

## 2018-01-31 ENCOUNTER — Other Ambulatory Visit: Payer: Medicare Other

## 2018-01-31 DIAGNOSIS — R197 Diarrhea, unspecified: Secondary | ICD-10-CM

## 2018-01-31 LAB — TISSUE TRANSGLUTAMINASE, IGA: (tTG) Ab, IgA: 1 U/mL

## 2018-02-02 LAB — CALPROTECTIN, FECAL: Calprotectin, Fecal: 57 ug/g (ref 0–120)

## 2018-02-18 ENCOUNTER — Other Ambulatory Visit: Payer: Self-pay | Admitting: Primary Care

## 2018-02-18 DIAGNOSIS — K219 Gastro-esophageal reflux disease without esophagitis: Secondary | ICD-10-CM

## 2018-03-14 ENCOUNTER — Encounter: Payer: Medicare Other | Admitting: Gastroenterology

## 2018-03-16 HISTORY — PX: COLONOSCOPY: SHX174

## 2018-03-16 HISTORY — PX: POLYPECTOMY: SHX149

## 2018-04-01 ENCOUNTER — Ambulatory Visit (AMBULATORY_SURGERY_CENTER): Payer: Medicare Other | Admitting: Gastroenterology

## 2018-04-01 ENCOUNTER — Encounter: Payer: Self-pay | Admitting: Gastroenterology

## 2018-04-01 VITALS — BP 120/76 | HR 70 | Temp 96.6°F | Resp 13 | Ht 67.0 in | Wt 179.0 lb

## 2018-04-01 DIAGNOSIS — K2970 Gastritis, unspecified, without bleeding: Secondary | ICD-10-CM

## 2018-04-01 DIAGNOSIS — R109 Unspecified abdominal pain: Secondary | ICD-10-CM

## 2018-04-01 DIAGNOSIS — D122 Benign neoplasm of ascending colon: Secondary | ICD-10-CM | POA: Diagnosis not present

## 2018-04-01 DIAGNOSIS — K635 Polyp of colon: Secondary | ICD-10-CM

## 2018-04-01 DIAGNOSIS — D123 Benign neoplasm of transverse colon: Secondary | ICD-10-CM | POA: Diagnosis not present

## 2018-04-01 DIAGNOSIS — K2980 Duodenitis without bleeding: Secondary | ICD-10-CM | POA: Diagnosis not present

## 2018-04-01 DIAGNOSIS — R197 Diarrhea, unspecified: Secondary | ICD-10-CM

## 2018-04-01 MED ORDER — SODIUM CHLORIDE 0.9 % IV SOLN: 500.0000 mL | Freq: Once | INTRAVENOUS | Status: DC

## 2018-04-01 NOTE — Op Note (Signed)
[facility] Patient Name: Christie Wells Procedure Date: 04/01/2018 10:06 AM MRN: [id number] Endoscopist: Thornton Park MD, MD Age: 68 Referring MD:  Date of Birth: 10-Dec-1950 Gender: Female Account #: [id number] Procedure:                Upper GI endoscopy Indications:              Dysphagia, Diarrhea Medicines:                See the Anesthesia note for documentation of the                            administered medications Procedure:                Pre-Anesthesia Assessment:                           - Prior to the procedure, a History and Physical                            was performed, and patient medications and                            allergies were reviewed. The patient's tolerance of                            previous anesthesia was also reviewed. The risks                            and benefits of the procedure and the sedation                            options and risks were discussed with the patient.                            All questions were answered, and informed consent                            was obtained. Prior Anticoagulants: The patient has                            taken no previous anticoagulant or antiplatelet                            agents. ASA Grade Assessment: II - A patient with                            mild systemic disease. After reviewing the risks                            and benefits, the patient was deemed in                            satisfactory condition to undergo the procedure.  After obtaining informed consent, the endoscope was                            passed under direct vision. Throughout the                            procedure, the patient's blood pressure, pulse, and                            oxygen saturations were monitored continuously. The                            Model GIF-[id number]) scope was introduced                            through the mouth, and advanced  to the third part                            of duodenum. The upper GI endoscopy was                            accomplished without difficulty. The patient                            tolerated the procedure well. Scope In: Scope Out: Findings:                 The examined esophagus was normal. Biopsies were                            obtained from the proximal and distal esophagus                            with cold forceps for histology of suspected                            eosinophilic esophagitis.                           Scattered mild inflammation characterized by                            erythema, friability and granularity was found in                            the gastric body. Biopsies were taken with a cold                            forceps for histology.                           The examined duodenum was normal. Biopsies were                            taken with a cold forceps for histology. Estimated  blood loss was minimal.                           The exam was otherwise without abnormality. Complications:            No immediate complications. Estimated blood loss:                            Minimal. Estimated Blood Loss:     Estimated blood loss was minimal. Impression:               - Normal esophagus. Biopsied.                           - Mild gastritis. Biopsied.                           - Normal examined duodenum. Biopsied.                           - The examination was otherwise normal. Recommendation:           - Discharge patient to home.                           - Resume regular diet.                           - Continue present medications.                           - Await pathology results.                           - No surveillance endoscopy indicated at this time.                           - Proceed with colonoscopy today as previously                            planned. Thornton Park MD, MD 04/01/2018 10:52:05  AM This report has been signed electronically.

## 2018-04-01 NOTE — Progress Notes (Signed)
To PACU, VSS. Report to [email or web address] 

## 2018-04-01 NOTE — Op Note (Signed)
[facility] Patient Name: Christie Wells Procedure Date: 04/01/2018 10:06 AM MRN: [id number] Endoscopist: Thornton Park MD, MD Age: 68 Referring MD:  Date of Birth: [birth year] Gender: Female Account #: [id number] Procedure:                Colonoscopy Indications:              Chronic diarrhea. No known family history of colon                            cancer or polyps. Medicines:                See the Anesthesia note for documentation of the                            administered medications Procedure:                Pre-Anesthesia Assessment:                           - Prior to the procedure, a History and Physical                            was performed, and patient medications and                            allergies were reviewed. The patient's tolerance of                            previous anesthesia was also reviewed. The risks                            and benefits of the procedure and the sedation                            options and risks were discussed with the patient.                            All questions were answered, and informed consent                            was obtained. Prior Anticoagulants: The patient has                            taken no previous anticoagulant or antiplatelet                            agents. ASA Grade Assessment: II - A patient with                            mild systemic disease. After reviewing the risks                            and benefits, the patient was deemed in  satisfactory condition to undergo the procedure.                           After obtaining informed consent, the colonoscope                            was passed under direct vision. Throughout the                            procedure, the patient's blood pressure, pulse, and                            oxygen saturations were monitored continuously. The                            Colonoscope was introduced through the  anus and                            advanced to the the terminal ileum, with                            identification of the appendiceal orifice and IC                            valve. The colonoscopy was performed without                            difficulty. The patient tolerated the procedure                            well. The quality of the bowel preparation was                            excellent. Scope In: 10:25:32 AM Scope Out: 10:44:52 AM Scope Withdrawal Time: 0 hours 17 minutes 52 seconds  Total Procedure Duration: 0 hours 19 minutes 20 seconds  Findings:                 The perianal and digital rectal examinations were                            normal.                           A 4 mm polyp was found in the proximal ascending                            colon. The polyp was sessile. The polyp was removed                            with a cold snare. Resection and retrieval were                            complete. Estimated blood loss was minimal.  A 1 mm polyp was found in the distal ascending                            colon. The polyp was sessile. The polyp was removed                            with a cold biopsy forceps after three failed                            attempts to resect it with a snare. Resection and                            retrieval were complete.                           A 3 mm polyp was found in the splenic flexure. The                            polyp was sessile. The polyp was removed with a                            cold snare. Resection and retrieval were complete.                           The colon (entire examined portion) appeared                            normal. Biopsies for histology were taken with a                            cold forceps from the entire colon for evaluation                            of microscopic colitis. Estimated blood loss was                            minimal.                            The exam was otherwise without abnormality on                            direct and retroflexion views. Complications:            No immediate complications. Estimated blood loss:                            Minimal. Estimated Blood Loss:     Estimated blood loss was minimal. Impression:               - One 4 mm polyp in the proximal ascending colon,                            removed with a cold snare. Resected and  retrieved.                           - One 1 mm polyp in the distal ascending colon,                            removed with a cold biopsy forceps. Resected and                            retrieved.                           - One 3 mm polyp at the splenic flexure, removed                            with a cold snare. Resected and retrieved.                           - The entire examined colon is normal. Biopsied.                           - The examination was otherwise normal on direct                            and retroflexion views. Recommendation:           - Discharge patient to home.                           - Resume regular diet.                           - Continue present medications.                           - Await pathology results.                           - Repeat colonoscopy in 5 years for surveillance if                            1 or 2 polyps is adenomatous. If all three polyps                            are adenomatous, repeat colonoscopy in 3 years.                           - Return to GI office to review these results. Thornton Park MD, MD 04/01/2018 11:00:13 AM This report has been signed electronically.

## 2018-04-01 NOTE — Progress Notes (Signed)
Called to room to assist during endoscopic procedure.  Patient ID and intended procedure confirmed with present staff. Received instructions for my participation in the procedure from the performing physician.  

## 2018-04-01 NOTE — Patient Instructions (Signed)
Information on polyps and gastritis given to you today.  Await pathology results.  Return to GI office to review results.   YOU HAD AN ENDOSCOPIC PROCEDURE TODAY AT [facility]:   Refer to the procedure report that was given to you for any specific questions about what was found during the examination.  If the procedure report does not answer your questions, please call your gastroenterologist to clarify.  If you requested that your care partner not be given the details of your procedure findings, then the procedure report has been included in a sealed envelope for you to review at your convenience later.  YOU SHOULD EXPECT: Some feelings of bloating in the abdomen. Passage of more gas than usual.  Walking can help get rid of the air that was put into your GI tract during the procedure and reduce the bloating. If you had a lower endoscopy (such as a colonoscopy or flexible sigmoidoscopy) you may notice spotting of blood in your stool or on the toilet paper. If you underwent a bowel prep for your procedure, you may not have a normal bowel movement for a few days.  Please Note:  You might notice some irritation and congestion in your nose or some drainage.  This is from the oxygen used during your procedure.  There is no need for concern and it should clear up in a day or so.  SYMPTOMS TO REPORT IMMEDIATELY:   Following lower endoscopy (colonoscopy or flexible sigmoidoscopy):  Excessive amounts of blood in the stool  Significant tenderness or worsening of abdominal pains  Swelling of the abdomen that is new, acute  Fever of 100F or higher   Following upper endoscopy (EGD)  Vomiting of blood or coffee ground material  New chest pain or pain under the shoulder blades  Painful or persistently difficult swallowing  New shortness of breath  Fever of 100F or higher  Black, tarry-looking stools  For urgent or emergent issues, a gastroenterologist can be reached at any hour by  calling [phone].   DIET:  We do recommend a small meal at first, but then you may proceed to your regular diet.  Drink plenty of fluids but you should avoid alcoholic beverages for 24 hours.  ACTIVITY:  You should plan to take it easy for the rest of today and you should NOT DRIVE or use heavy machinery until tomorrow (because of the sedation medicines used during the test).    FOLLOW UP: Our staff will call the number listed on your records the next business day following your procedure to check on you and address any questions or concerns that you may have regarding the information given to you following your procedure. If we do not reach you, we will leave a message.  However, if you are feeling well and you are not experiencing any problems, there is no need to return our call.  We will assume that you have returned to your regular daily activities without incident.  If any biopsies were taken you will be contacted by phone or by letter within the next 1-3 weeks.  Please call us at [phone] if you have not heard about the biopsies in 3 weeks.    SIGNATURES/CONFIDENTIALITY: You and/or your care partner have signed paperwork which will be entered into your electronic medical record.  These signatures attest to the fact that that the information above on your After Visit Summary has been reviewed and is understood.  Full responsibility of the  confidentiality of this discharge information lies with you and/or your care-partner. 

## 2018-04-01 NOTE — Progress Notes (Signed)
Pt's states no medical or surgical changes since previsit or office visit. 

## 2018-04-04 ENCOUNTER — Telehealth: Payer: Self-pay | Admitting: *Deleted

## 2018-04-04 NOTE — Telephone Encounter (Signed)
  Follow up Call-  Call back number 04/01/2018  Post procedure Call Back phone  # [phone]  Permission to leave phone message Yes  Some recent data might be hidden     Patient questions:  Do you have a fever, pain , or abdominal swelling? No. Pain Score  0 *  Have you tolerated food without any problems? Yes.  Have you been able to return to your normal activities? Yes.    Do you have any questions about your discharge instructions: Diet   No. Medications  No. Follow up visit  No.  Do you have questions or concerns about your Care? No.  Actions: * If pain score is 4 or above: No action needed, pain <4.

## 2018-04-11 ENCOUNTER — Encounter: Payer: Self-pay | Admitting: Gastroenterology

## 2018-05-02 ENCOUNTER — Other Ambulatory Visit: Payer: Self-pay | Admitting: Primary Care

## 2018-05-02 DIAGNOSIS — G43101 Migraine with aura, not intractable, with status migrainosus: Secondary | ICD-10-CM

## 2018-05-03 NOTE — Telephone Encounter (Signed)
Please call patient:  How often is she using the sumatriptan medication for migraines? Is she taking the amitriptyline at bedtime for headaches?  If so has she noticed less headaches? Who has been prescribing her sumatriptan?

## 2018-05-03 NOTE — Telephone Encounter (Signed)
Rx have not been prescribed by Allie Bossier. Last office visit on 11/25/2017. No future appointment

## 2018-05-03 NOTE — Telephone Encounter (Signed)
Message left for patient to return my call.  

## 2018-05-04 ENCOUNTER — Telehealth: Payer: Self-pay | Admitting: Primary Care

## 2018-05-04 NOTE — Telephone Encounter (Signed)
See refill encounter

## 2018-05-04 NOTE — Telephone Encounter (Signed)
Spoken and notified patient of Christie Wells comments.   4 times a month, 1/2 tablet  No, the amitriptyline is more for her sleep. She takes gabapentin daily at bedtime for headache preventation.  Both the gabapentin and sumatriptan was first prescribed byher neurology in [place] before she moved here.

## 2018-05-04 NOTE — Telephone Encounter (Signed)
See my chart message

## 2018-05-04 NOTE — Telephone Encounter (Signed)
Pt called to request Sumatriptan 50mg  (usually 9 pills) to be sent to [facility] [address] in [place]. She uses them as needed for headaches.

## 2018-05-05 MED ORDER — SUMATRIPTAN SUCCINATE 50 MG PO TABS: ORAL_TABLET | ORAL | 1 refills | Status: DC

## 2018-05-10 ENCOUNTER — Other Ambulatory Visit: Payer: Self-pay

## 2018-05-10 DIAGNOSIS — K219 Gastro-esophageal reflux disease without esophagitis: Secondary | ICD-10-CM

## 2018-05-10 MED ORDER — LANSOPRAZOLE 30 MG PO CPDR: 30.0000 mg | DELAYED_RELEASE_CAPSULE | Freq: Two times a day (BID) | ORAL | 3 refills | Status: DC

## 2018-06-10 ENCOUNTER — Ambulatory Visit: Payer: Medicare Other | Admitting: Gastroenterology

## 2018-06-14 ENCOUNTER — Encounter: Payer: Self-pay | Admitting: Gastroenterology

## 2018-06-14 ENCOUNTER — Ambulatory Visit (INDEPENDENT_AMBULATORY_CARE_PROVIDER_SITE_OTHER): Payer: Medicare Other | Admitting: Gastroenterology

## 2018-06-14 ENCOUNTER — Other Ambulatory Visit: Payer: Self-pay

## 2018-06-14 DIAGNOSIS — R197 Diarrhea, unspecified: Secondary | ICD-10-CM

## 2018-06-14 DIAGNOSIS — R1310 Dysphagia, unspecified: Secondary | ICD-10-CM

## 2018-06-14 MED ORDER — COLESTIPOL HCL 1 G PO TABS: 2.0000 g | ORAL_TABLET | Freq: Two times a day (BID) | ORAL | 3 refills | Status: DC

## 2018-06-14 NOTE — Patient Instructions (Signed)
Try cholestipol. Start with 2g daily for one week. If no improvement, increase to 2 g twice daily. Please let me know if this isn't helping after 2-3 weeks.  Thank you for your patience with me and our technology today! Please stay home, safe, and healthy.

## 2018-06-14 NOTE — Progress Notes (Signed)
 TELEHEALTH VISIT  Referring Provider: Clark, Katherine K, NP Primary Care Physician:  Clark, Katherine K, NP  Tele-visit due to COVID-19 pandemic Patient requested visit virtually, consented to the virtual encounter via WebEx for audio and visual communication Patient is aware of possible charges through their insurance for this visit Contact made at: 10:52 06/14/18 Patient verified by name and date of birth Location of patient: Home Location provider: My medical office Names of persons participating: Me, patient Time spent on telehealth visit: 22 minutes   Chief complaint:  diarrhea   IMPRESSION:  Diarrhea x 8 months without alarm features    - Stool studies 11/26/2017: negative infectious evaluation including Campylobacter, C. difficile, Cryptosporidium, E. coli, Giardia, norovirus, rotavirus, Salmonella, and Shigella.   - Normal TSH 0.62 09/2017   -01/28/18: ESR 2, CRP 0.650, normal IgA, normal TTGA, negative FOBT; fecal calprotectin 57   - normal duodenal and random colon biopsies 04/01/18 GERD controlled on lansoprazole Dysphagia at the level of the sternal notch   -No eosinophilic esophagitis on EGD biopsies 04/01/2018 Cholelithiasis on ultrasound 12/08/2017 Prior use of NSAIDs for headaches Nonbleeding internal hemorrhoids seen on prior colonoscopy History of colon polyps    -Colonoscopy in [place] 12/29/2013    -3 tubular adenomas on colonoscopy 04/01/2018    -Surveillance colonoscopy due 2023   Fecal calprotectin, ESR, and CRP were normal.  IgA tissue transglutaminase, IgA level to test for celiac disease were normal.  EGD with duodenal biopsies was normal. Colonoscopy with random biopsies was normal.  Will focus on symptom management given the description of her symptoms today.   PLAN: Cholestipol 2g once daily for a week, if no improvement increase to 2g twice daily Surveillance colonoscopy due 2023 Call if no improvement in 2-3 weeks     HPI: Christie Wells is a 68  y.o. female with chronic diarrhea.  The interval history is obtained through the patient and review of her electronic health record.  She has arthritis, chronic headaches, gastroesophageal reflux disease.  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  Labs in November included normal sed rate 2, normal CRP 0.650, normal IgA, normal TTGA and negative fecal occult blood.  Her fecal calprotectin was 57. She had an EGD and colonoscopy 04/01/2018.  Biopsies at the time of the EGD showed mild reflux esophagitis, H. pylori negative gastritis, and duodenitis.  Random colon biopsies were normal.  3 tubular adenomas were removed.  Continues to have diarrhea.  BM every morning, small skinny, long, formed stools. Associated epigastric abdominal pain and early morning nausea. Increased eructation with gas cramps.  No bood or mucous.  Weight is stable.  Appetite is good.  Continues to take Imodium. Used over Christmas. Has only needed it intermittent since that time.  Specifically asking about the need for a probiotic or antibiotics.  She drinks coffee maybe once a week, alcohol once a week, and avoids lactose.   No other associated symptoms. No identified exacerbating or relieving features.   Daughter here from [place] staying with her to avoid coronavirus.  [employer]/head [employer].   Past Medical History:  Diagnosis Date  . Arthritis   . Chronic headaches   . Colitis 1984  . GERD (gastroesophageal reflux disease)   . Hypothyroidism   . Migraines   . UTI (urinary tract infection)     Past Surgical History:  Procedure Laterality Date  . BUNIONECTOMY Bilateral   . COLONOSCOPY  2015  . EXCISION OF BREAST   BIOPSY Left   . FOOT SURGERY Bilateral    5 screws   . TUBAL LIGATION    . UPPER GI ENDOSCOPY     Gastritis    Current Outpatient Medications  Medication Sig Dispense Refill  . amitriptyline (ELAVIL) 10 MG tablet Take 0.5-1 tablets by mouth daily as needed.  1  .  cholecalciferol (VITAMIN D) 1000 units tablet Take 3,000 Units by mouth daily.    . cyanocobalamin 1000 MCG tablet Take 1,000 mcg by mouth daily.    . gabapentin (NEURONTIN) 100 MG capsule Take 2 capsules (200 mg total) by mouth at bedtime. 180 capsule 3  . lansoprazole (PREVACID) 30 MG capsule Take 1 capsule (30 mg total) by mouth 2 (two) times daily before a meal. 180 capsule 3  . levothyroxine (SYNTHROID, LEVOTHROID) 100 MCG tablet TAKE 1 TABLET BY MOUTH EVERY MORNING ON EMPTY STOMACH WITH A FULL GLASS OF WATER 90 tablet 1  . meloxicam (MOBIC) 15 MG tablet Take 1 tablet by mouth once daily as needed for pain. 30 tablet 1  . ranitidine (ZANTAC) 150 MG tablet TAKE 1 TABLET (150 MG TOTAL) BY MOUTH AT BEDTIME. 90 tablet 1  . SUMAtriptan (IMITREX) 50 MG tablet Take 1 tablet by mouth at migraine onset, may repeat with second tablet in 2 hours if migraine persists. 10 tablet 1   No current facility-administered medications for this visit.     Allergies as of 06/14/2018 - Review Complete 04/01/2018  Allergen Reaction Noted  . Codeine Nausea And Vomiting 09/19/2009  . Peanut-containing drug products  04/21/2017  . Sulfa antibiotics Rash 09/19/2009    Family History  Problem Relation Age of Onset  . Alzheimer's disease Mother   . Hypertension Mother   . Hyperlipidemia Mother   . COPD Mother   . Stroke Father   . Hypertension Father   . Heart disease Maternal Grandmother   . Parkinson's disease Paternal Grandfather   . Colon cancer Neg Hx   . Esophageal cancer Neg Hx     Social History   Socioeconomic History  . Marital status: Divorced    Spouse name: Not on file  . Number of children: 2  . Years of education: Not on file  . Highest education level: Not on file  Occupational History  . Not on file  Social Needs  . Financial resource strain: Not on file  . Food insecurity:    Worry: Not on file    Inability: Not on file  . Transportation needs:    Medical: Not on file     Non-medical: Not on file  Tobacco Use  . Smoking status: Never Smoker  . Smokeless tobacco: Never Used  Substance and Sexual Activity  . Alcohol use: Yes    Comment: occ  . Drug use: No  . Sexual activity: Not on file  Lifestyle  . Physical activity:    Days per week: Not on file    Minutes per session: Not on file  . Stress: Not on file  Relationships  . Social connections:    Talks on phone: Not on file    Gets together: Not on file    Attends religious service: Not on file    Active member of club or organization: Not on file    Attends meetings of clubs or organizations: Not on file    Relationship status: Not on file  . Intimate partner violence:    Fear of current or ex partner: Not on file      Emotionally abused: Not on file    Physically abused: Not on file    Forced sexual activity: Not on file  Other Topics Concern  . Not on file  Social History Narrative   Divorced.   2 children.   Moved from [place].   Once worked as an Athletic director.        Physical Exam:  Physical Exam: General: in no acute distress Neuro: Alert and appropriate Psych: Normal affect and normal insight Physical exam otherwise limited due to WebEx technology   Kimberly L. Beavers, Md, MPH  Gastroenterology 06/14/2018, 10:50 AM     

## 2018-06-29 ENCOUNTER — Other Ambulatory Visit: Payer: Self-pay

## 2018-06-29 ENCOUNTER — Ambulatory Visit (INDEPENDENT_AMBULATORY_CARE_PROVIDER_SITE_OTHER): Payer: Medicare Other | Admitting: Gastroenterology

## 2018-06-29 ENCOUNTER — Encounter: Payer: Self-pay | Admitting: Gastroenterology

## 2018-06-29 VITALS — Ht 67.0 in | Wt 179.0 lb

## 2018-06-29 DIAGNOSIS — R197 Diarrhea, unspecified: Secondary | ICD-10-CM | POA: Diagnosis not present

## 2018-06-29 NOTE — Patient Instructions (Signed)
Continue colestipol Follow-up in 1 month or earlier as needed

## 2018-06-29 NOTE — Progress Notes (Signed)
TELEHEALTH VISIT  Referring Provider: Pleas Koch, NP Primary Care Physician:  Pleas Koch, NP   Tele-visit due to COVID-19 pandemic Patient requested visit virtually, consented to the virtual encounter via video enabled telemedicine application (Zoom) Contact made at: 11:15 06/29/18 Patient verified by name and date of birth Location of patient: Home Location provider: [place] [facility] Names of persons participating: Me, patient, Tinnie Gens CMA Time spent on telehealth visit: 15 minutes I discussed the limitations of evaluation and management by telemedicine. The patient expressed understanding and agreed to proceed.  Chief complaint: Diarrhea   IMPRESSION:  Diarrhea x 8 months without alarm features    - Stool studies 11/26/2017: negative Campylobacter, C. difficile, Cryptosporidium, E. coli, Giardia, norovirus, rotavirus, Salmonella, and Shigella.   - Normal TSH 0.62 09/2017   -01/28/18: ESR 2, CRP 0.650, normal IgA, normal TTGA, negative FOBT; fecal calprotectin 57   - normal duodenal and random colon biopsies 04/01/18 GERD controlled on lansoprazole Dysphagia at the level of the sternal notch   -No eosinophilic esophagitis on EGD biopsies 04/01/2018 Cholelithiasis on ultrasound 12/08/2017 Prior use of NSAIDs for headaches Nonbleeding internal hemorrhoids seen on prior colonoscopy History of colon polyps    -Colonoscopy in [place] 12/29/2013    -3 tubular adenomas on colonoscopy 04/01/2018    -Surveillance colonoscopy due 2023  Diarrhea has improved on colestipol although bowel habits have not yet returned to normal.  There are no alarm features.  PLAN: Continue colestipol 2g BID, may increase the dose further if needed Reasses in 1 month, call with questions or concerns in the meantime Surveillance colonoscopy due 2023   HPI: Christie Wells is a 68 y.o. female with diarrhea. Last visit 06/14/18.  Started on colestipol 2 g daily. No change in symptoms.  Increased to colestipol 2g BID and things have improved. Stools are formed. Light brown.  Now having 1-1.5 BM daily each morning.  No blood or mucus.  Still not normal, although better.  Less epigastric pain and nausea.  No associated side effects to the colestipol except for the possibility of stomach fullness. No constipation.  Appetite remains good.  Her weight has increased.  Daughter from [place] continues to live with her.    Past Medical History:  Diagnosis Date  . Arthritis   . Chronic headaches   . Colitis 1984  . GERD (gastroesophageal reflux disease)   . Hypothyroidism   . Migraines   . UTI (urinary tract infection)     Past Surgical History:  Procedure Laterality Date  . BUNIONECTOMY Bilateral   . COLONOSCOPY  2015  . EXCISION OF BREAST BIOPSY Left   . FOOT SURGERY Bilateral    5 screws   . TUBAL LIGATION    . UPPER GI ENDOSCOPY     Gastritis    Current Outpatient Medications  Medication Sig Dispense Refill  . amitriptyline (ELAVIL) 10 MG tablet Take 0.5-1 tablets by mouth daily as needed.  1  . cholecalciferol (VITAMIN D) 1000 units tablet Take 3,000 Units by mouth daily.    . colestipol (COLESTID) 1 g tablet Take 2 tablets (2 g total) by mouth 2 (two) times daily. 60 tablet 3  . cyanocobalamin 1000 MCG tablet Take 1,000 mcg by mouth daily.    Marland Kitchen gabapentin (NEURONTIN) 100 MG capsule Take 2 capsules (200 mg total) by mouth at bedtime. 180 capsule 3  . lansoprazole (PREVACID) 30 MG capsule Take 1 capsule (30 mg total) by mouth 2 (two) times daily before  a meal. 180 capsule 3  . levothyroxine (SYNTHROID, LEVOTHROID) 100 MCG tablet TAKE 1 TABLET BY MOUTH EVERY MORNING ON EMPTY STOMACH WITH A FULL GLASS OF WATER 90 tablet 1  . meloxicam (MOBIC) 15 MG tablet Take 1 tablet by mouth once daily as needed for pain. 30 tablet 1  . ranitidine (ZANTAC) 150 MG tablet TAKE 1 TABLET (150 MG TOTAL) BY MOUTH AT BEDTIME. 90 tablet 1  . SUMAtriptan (IMITREX) 50 MG tablet Take 1 tablet by  mouth at migraine onset, may repeat with second tablet in 2 hours if migraine persists. 10 tablet 1   No current facility-administered medications for this visit.     Allergies as of 06/29/2018 - Review Complete 06/29/2018  Allergen Reaction Noted  . Codeine Nausea And Vomiting 09/19/2009  . Peanut-containing drug products  04/21/2017  . Sulfa antibiotics Rash 09/19/2009    Family History  Problem Relation Age of Onset  . Alzheimer's disease Mother   . Hypertension Mother   . Hyperlipidemia Mother   . COPD Mother   . Stroke Father   . Hypertension Father   . Heart disease Maternal Grandmother   . Parkinson's disease Paternal Grandfather   . Colon cancer Neg Hx   . Esophageal cancer Neg Hx     Social History   Socioeconomic History  . Marital status: Divorced    Spouse name: Not on file  . Number of children: 2  . Years of education: Not on file  . Highest education level: Not on file  Occupational History  . Not on file  Social Needs  . Financial resource strain: Not on file  . Food insecurity:    Worry: Not on file    Inability: Not on file  . Transportation needs:    Medical: Not on file    Non-medical: Not on file  Tobacco Use  . Smoking status: Never Smoker  . Smokeless tobacco: Never Used  Substance and Sexual Activity  . Alcohol use: Yes    Comment: occ  . Drug use: No  . Sexual activity: Not on file  Lifestyle  . Physical activity:    Days per week: Not on file    Minutes per session: Not on file  . Stress: Not on file  Relationships  . Social connections:    Talks on phone: Not on file    Gets together: Not on file    Attends religious service: Not on file    Active member of club or organization: Not on file    Attends meetings of clubs or organizations: Not on file    Relationship status: Not on file  . Intimate partner violence:    Fear of current or ex partner: Not on file    Emotionally abused: Not on file    Physically abused: Not on  file    Forced sexual activity: Not on file  Other Topics Concern  . Not on file  Social History Narrative   Divorced.   2 children.   Moved from [place].   Once worked as an Journalist, newspaper.        Review of Systems: ALL ROS discussed and all others negative except listed in HPI.  Physical Exam: General: in no acute distress Neuro: Alert and appropriate Psych: Normal affect and normal insight   Oma Alpert L. Tarri Glenn, MD, MPH [facility] 06/29/2018, 11:08 AM

## 2018-07-26 ENCOUNTER — Other Ambulatory Visit: Payer: Self-pay | Admitting: Primary Care

## 2018-07-26 DIAGNOSIS — E039 Hypothyroidism, unspecified: Secondary | ICD-10-CM

## 2018-07-27 ENCOUNTER — Other Ambulatory Visit: Payer: Self-pay

## 2018-07-27 ENCOUNTER — Encounter: Payer: Self-pay | Admitting: Gastroenterology

## 2018-07-27 ENCOUNTER — Ambulatory Visit (INDEPENDENT_AMBULATORY_CARE_PROVIDER_SITE_OTHER): Payer: Medicare Other | Admitting: Gastroenterology

## 2018-07-27 VITALS — Ht 67.0 in | Wt 179.0 lb

## 2018-07-27 DIAGNOSIS — K219 Gastro-esophageal reflux disease without esophagitis: Secondary | ICD-10-CM

## 2018-07-27 DIAGNOSIS — R197 Diarrhea, unspecified: Secondary | ICD-10-CM

## 2018-07-27 NOTE — Progress Notes (Signed)
TELEHEALTH VISIT  Referring Provider: Pleas Koch, NP Primary Care Physician:  Pleas Koch, NP   Tele-visit due to COVID-19 pandemic Patient requested visit virtually, consented to the virtual encounter via audio enabled telemedicine application Contact made at: 07/27/18 13:35 Patient verified by name and date of birth Location of patient: Home Location provider: [place] [facility] Names of persons participating: Me, patient, Tinnie Gens CMA Time spent on telehealth visit: 25 minutes I discussed the limitations of evaluation and management by telemedicine. The patient expressed understanding and agreed to proceed.  Chief complaint: Diarrhea   IMPRESSION:  Diarrhea x 8 months without alarm features    - Stool studies 11/26/2017: negative Campylobacter, C. difficile, Cryptosporidium, E. coli, Giardia, norovirus, rotavirus, Salmonella, and Shigella.   - Normal TSH 0.62 09/2017   -01/28/18: ESR 2, CRP 0.650, normal IgA, normal TTGA, negative FOBT; fecal calprotectin 57   - normal duodenal and random colon biopsies 04/01/18 GERD controlled on lansoprazole    - 04/01/18: esophagus normal on EGD, biopsies consistent with esophagitis Dysphagia at the level of the sternal notch    - primarily to pills    - No eosinophilic esophagitis on EGD biopsies 04/01/2018 Cholelithiasis on ultrasound 12/08/2017 Prior use of NSAIDs for headaches Nonbleeding internal hemorrhoids seen on prior colonoscopy History of colon polyps    -Colonoscopy in [place] 12/29/2013    -3 tubular adenomas on colonoscopy 04/01/2018    -Surveillance colonoscopy due 2023  Diarrhea has improved on a higher dose of colestipol although bowel habits have not yet returned to normal.  There are no alarm features. She is wanting to avoid long-term use of prescription medications.   Lansoprazole may be exacerbating her diarrhea, although the diarrhea started in the fall and she has been on lansoprazole for many  years. She would like to stop using PPIs if possible.  I have recommended considering a TIF.   PLAN: Continue colestipol 2g BID until symptoms are well controlled, may increase the dose further if needed (Refill provided today) Continue lansoprazole 30 mg BID Reviewed lifestyle modifications for reflux Telehealth consult with Dr. Bryan Lemma re: TIF Office visit in 2-3 months, earlier if needed (30 minutes) Surveillance colonoscopy due 2023   HPI: Christie Wells is a 68 y.o. female with chronic diarrhea. Last visit 06/29/18. Interval history is obtained through the patient.  Stool are more formed on colestipol 2 g BID for diarrhea. However, she does not want to take these pills for the rest of her life.   Wants to discuss her reflux. Present for decades, but worse since July. Temporal association with the onset of her diarrhea.  Initially diagnosed by allergist in [place] after an extensive negative evaluation by a GI in [place]. Notes congestion associated with reflux. Triggered by some foods - particularly greasy/fried and carrots. Pill dysphagia but no dysphagia to solids and liquids.  Does not easily swallow the lansoprazole because of it's size. She would like to consider non-medication options. No odynophagia, dysphonia, or neck pain. No anemia or blood in the stool.  Appetite remains good.  Her weight has increased.  Daughter from [place] continues to live with her to avoid coronavirus.   No new complaints or concerns.    Past Medical History:  Diagnosis Date  . Arthritis   . Chronic headaches   . Colitis 1984  . GERD (gastroesophageal reflux disease)   . Hypothyroidism   . Migraines   . UTI (urinary tract infection)     Past Surgical  History:  Procedure Laterality Date  . BUNIONECTOMY Bilateral   . COLONOSCOPY  2015  . EXCISION OF BREAST BIOPSY Left   . FOOT SURGERY Bilateral    5 screws   . TUBAL LIGATION    . UPPER GI ENDOSCOPY     Gastritis    Current  Outpatient Medications  Medication Sig Dispense Refill  . amitriptyline (ELAVIL) 10 MG tablet Take 0.5-1 tablets by mouth daily as needed.  1  . cholecalciferol (VITAMIN D) 1000 units tablet Take 3,000 Units by mouth daily.    . colestipol (COLESTID) 1 g tablet Take 2 tablets (2 g total) by mouth 2 (two) times daily. 60 tablet 3  . cyanocobalamin 1000 MCG tablet Take 1,000 mcg by mouth daily.    Marland Kitchen gabapentin (NEURONTIN) 100 MG capsule Take 2 capsules (200 mg total) by mouth at bedtime. 180 capsule 3  . lansoprazole (PREVACID) 30 MG capsule Take 1 capsule (30 mg total) by mouth 2 (two) times daily before a meal. 180 capsule 3  . levothyroxine (SYNTHROID) 100 MCG tablet TAKE 1 TABLET BY MOUTH EVERY MORNING ON EMPTY STOMACH WITH A FULL GLASS OF WATER 90 tablet 0  . meloxicam (MOBIC) 15 MG tablet Take 1 tablet by mouth once daily as needed for pain. 30 tablet 1  . ranitidine (ZANTAC) 150 MG tablet TAKE 1 TABLET (150 MG TOTAL) BY MOUTH AT BEDTIME. 90 tablet 1  . SUMAtriptan (IMITREX) 50 MG tablet Take 1 tablet by mouth at migraine onset, may repeat with second tablet in 2 hours if migraine persists. 10 tablet 1   No current facility-administered medications for this visit.     Allergies as of 07/27/2018 - Review Complete 07/27/2018  Allergen Reaction Noted  . Codeine Nausea And Vomiting 09/19/2009  . Peanut-containing drug products  04/21/2017  . Sulfa antibiotics Rash 09/19/2009    Family History  Problem Relation Age of Onset  . Alzheimer's disease Mother   . Hypertension Mother   . Hyperlipidemia Mother   . COPD Mother   . Stroke Father   . Hypertension Father   . Heart disease Maternal Grandmother   . Parkinson's disease Paternal Grandfather   . Colon cancer Neg Hx   . Esophageal cancer Neg Hx     Social History   Socioeconomic History  . Marital status: Divorced    Spouse name: Not on file  . Number of children: 2  . Years of education: Not on file  . Highest education  level: Not on file  Occupational History  . Not on file  Social Needs  . Financial resource strain: Not on file  . Food insecurity:    Worry: Not on file    Inability: Not on file  . Transportation needs:    Medical: Not on file    Non-medical: Not on file  Tobacco Use  . Smoking status: Never Smoker  . Smokeless tobacco: Never Used  Substance and Sexual Activity  . Alcohol use: Yes    Comment: occ  . Drug use: No  . Sexual activity: Not on file  Lifestyle  . Physical activity:    Days per week: Not on file    Minutes per session: Not on file  . Stress: Not on file  Relationships  . Social connections:    Talks on phone: Not on file    Gets together: Not on file    Attends religious service: Not on file    Active member of club or organization:  Not on file    Attends meetings of clubs or organizations: Not on file    Relationship status: Not on file  . Intimate partner violence:    Fear of current or ex partner: Not on file    Emotionally abused: Not on file    Physically abused: Not on file    Forced sexual activity: Not on file  Other Topics Concern  . Not on file  Social History Narrative   Divorced.   2 children.   Moved from [place].   Once worked as an Journalist, newspaper.       Physical Exam: General: in no acute distress Neuro: Alert and appropriate Psych: Normal affect and normal insight   Valon Glasscock L. Tarri Glenn, MD, MPH [facility] 07/27/2018, 9:28 AM

## 2018-07-27 NOTE — Patient Instructions (Addendum)
Continue colestipol 2g twice daily until symptoms are well controlled.  Continue lansoprazole. Telehealth consult with Dr. Bryan Lemma to discuss the TIF, this is scheduled for Thursday, 08/04/2018 at 2:00 pm. You may call our office if you need to reschedule this appointment.   Office visit in 2-3 months, earlier if needed. Please call the office to schedule in 2-3 months.   Thank you for your patience with me and our technology today! Please stay home, safe, and healthy. I look forward to seeing you again in person soon.

## 2018-08-04 ENCOUNTER — Other Ambulatory Visit: Payer: Self-pay

## 2018-08-04 ENCOUNTER — Encounter: Payer: Self-pay | Admitting: Gastroenterology

## 2018-08-04 ENCOUNTER — Telehealth (INDEPENDENT_AMBULATORY_CARE_PROVIDER_SITE_OTHER): Payer: Medicare Other | Admitting: Gastroenterology

## 2018-08-04 VITALS — Ht 67.75 in | Wt 175.0 lb

## 2018-08-04 DIAGNOSIS — R197 Diarrhea, unspecified: Secondary | ICD-10-CM

## 2018-08-04 DIAGNOSIS — R1310 Dysphagia, unspecified: Secondary | ICD-10-CM

## 2018-08-04 DIAGNOSIS — R1319 Other dysphagia: Secondary | ICD-10-CM

## 2018-08-04 DIAGNOSIS — K219 Gastro-esophageal reflux disease without esophagitis: Secondary | ICD-10-CM | POA: Diagnosis not present

## 2018-08-04 MED ORDER — FAMOTIDINE 20 MG PO TABS: 20.0000 mg | ORAL_TABLET | Freq: Every day | ORAL | 3 refills | Status: DC

## 2018-08-04 NOTE — Progress Notes (Signed)
Chief Complaint: GERD, Evaluation for TIF  Referring Provider:     Thornton Park, MD   HPI:    Due to current restrictions/limitations of in-office visits due to the COVID-19 pandemic, this scheduled clinical appointment was converted to a telehealth virtual consultation using D[employer]ity.  -Time of medical discussion: 35 minutes -The patient did consent to this virtual visit and is aware of possible charges through their insurance for this visit.  -Names of all parties present: Christie Wells (patient), Gerrit Heck, DO, [facility] (physician) -Patient location: Home -Physician location: Office  Christie Wells is a 68 y.o. female referred to me for evaluation of reflux with consideration for Transoral Incision less Fundoplication (TIF) for goal of stopping or significantly reducing aspiration therapy.  Longstanding history of reflux, with index symptoms of regurgitation, throat clearing, dry cough. Nocturnal choking sensation early in course (nearly resolved w/ PPI). Symptoms worse since 09/2017.  Exacerbated by greasy/fried food, parents.  Does describe pill dysphagia but no dysphagia to solids or liquids.  Denies chronic cough, hoarseness, raspy voice, globus.  Weight stable.  Incompletely controlled with lansoprazole 30 mg BID, still with nocturnal breakthrough sxs (throat clearing, congestion). Sxs 50% improved since increasing to BID dosing. Has been on acid suppression therapy since 2009. New to [place] from [place] in 2019.   No previous pH/Mii, Bravo, EM.  Additionally with a history of diarrhea x8 months, with essentially unremarkable evaluation to date.  Follows closely with Dr. Tarri Glenn for this eval/treatment. Improved with colestipol, but does have difficulty swallowing these pills. She has discussed with Dr. Tarri Glenn possibility of diarrhea being ADR to her chronic PPI therapy, which is furthering her desire to stop or significantly reduce PPI if possible.  GERD evaluation to  date: -EGD (03/2018, Dr. Tarri Glenn): Normal esophagus, biopsies negative for EOE, but reflux changes noted on biopsy.  Non-H. pylori gastritis, normal duodenum - EGD (2009, in [place]): Normal per patient. No report in EMR  Past medical history, past surgical history, social history, family history, medications, and allergies reviewed in the chart and with patient.    Past Medical History:  Diagnosis Date  . Arthritis   . Chronic headaches   . Colitis 1984  . GERD (gastroesophageal reflux disease)   . Hypothyroidism   . Migraines   . UTI (urinary tract infection)      Past Surgical History:  Procedure Laterality Date  . BUNIONECTOMY Bilateral   . COLONOSCOPY  2015  . EXCISION OF BREAST BIOPSY Left   . FOOT SURGERY Bilateral    5 screws   . TUBAL LIGATION    . UPPER GI ENDOSCOPY     Gastritis   Family History  Problem Relation Age of Onset  . Alzheimer's disease Mother   . Hypertension Mother   . Hyperlipidemia Mother   . COPD Mother   . Stroke Father   . Hypertension Father   . Heart disease Maternal Grandmother   . Parkinson's disease Paternal Grandfather   . Colon cancer Neg Hx   . Esophageal cancer Neg Hx    Social History   Tobacco Use  . Smoking status: Never Smoker  . Smokeless tobacco: Never Used  Substance Use Topics  . Alcohol use: Yes    Comment: occ  . Drug use: No   Current Outpatient Medications  Medication Sig Dispense Refill  . amitriptyline (ELAVIL) 10 MG tablet Take 0.5-1 tablets by mouth daily as needed.  1  . cholecalciferol (VITAMIN D) 1000 units tablet Take 3,000 Units by mouth daily.    . colestipol (COLESTID) 1 g tablet Take 2 tablets (2 g total) by mouth 2 (two) times daily. 60 tablet 3  . cyanocobalamin 1000 MCG tablet Take 1,000 mcg by mouth daily.    Marland Kitchen gabapentin (NEURONTIN) 100 MG capsule Take 2 capsules (200 mg total) by mouth at bedtime. 180 capsule 3  . lansoprazole (PREVACID) 30 MG capsule Take 1 capsule (30 mg total) by mouth 2 (two)  times daily before a meal. 180 capsule 3  . levothyroxine (SYNTHROID) 100 MCG tablet TAKE 1 TABLET BY MOUTH EVERY MORNING ON EMPTY STOMACH WITH A FULL GLASS OF WATER 90 tablet 0  . meloxicam (MOBIC) 15 MG tablet Take 1 tablet by mouth once daily as needed for pain. 30 tablet 1  . SUMAtriptan (IMITREX) 50 MG tablet Take 1 tablet by mouth at migraine onset, may repeat with second tablet in 2 hours if migraine persists. 10 tablet 1   No current facility-administered medications for this visit.    Allergies  Allergen Reactions  . Codeine Nausea And Vomiting    Other reaction(s): vomiting   . Peanut-Containing Drug Products     Headaches  . Sulfa Antibiotics Rash    Other reaction(s): rash     Review of Systems: All systems reviewed and negative except where noted in HPI.     Physical Exam:    Physical exam not completed due to the nature of this telehealth communication.  Patient was otherwise alert and oriented and well communicative.   ASSESSMENT AND PLAN;   1) GERD 2) Pill dysphagia  68 year old female with longstanding history of reflux, incompletely controlled despite high-dose PPI, interested in an antireflux surgical options.  Discussed TIF at length, and she like to proceed as below:  - Continue lansoprazole twice daily.  Counseled on changing evening dose to 30 to 60 minutes pre-dinner (was taking at bedtime) -Add Pepcid 20 mg qhs - Upper GI series to evaluate for preserved esophageal motility along with refluxate -If esophagram does not demonstrate significant reflux, plan for Bravo study (patient has strong gag reflex and does not think she would tolerate a pH/impedance study) - Holding off on Esophageal Manometry for now pending esophagram -Resume antireflux lifestyle measures -To follow-up with me for ongoing evaluation/treatment of GERD pending esophagram  3) Diarrhea: -Continue colestipol as prescribed and continue to follow-up with Dr. Tarri Glenn as previously  recommended   Lavena Bullion, DO, [facility]  08/04/2018, 1:26 PM   CC:  Thornton Park, MD

## 2018-08-04 NOTE — Patient Instructions (Addendum)
To help prevent the possible spread of infection to our patients, communities, and staff; we will be implementing the following measures:  As of now we are not allowing any visitors/family members to accompany you to any upcoming appointments with [facility]. If you have any concerns about this please contact our office to discuss prior to the appointment.   If you are age 67 or older, your body mass index should be between 23-30. Your Body mass index is 26.81 kg/m. If this is out of the aforementioned range listed, please consider follow up with your Primary Care Provider.  If you are age 51 or younger, your body mass index should be between 19-25. Your Body mass index is 26.81 kg/m. If this is out of the aformentioned range listed, please consider follow up with your Primary Care Provider.   You have been scheduled for a Barium Esophogram at [facility] (1st floor of the hospital) on 09/06/2018 at 10:30am. Please arrive 15 minutes prior to your appointment for registration. Make certain not to have anything to eat or drink 3 hours prior to your test. If you need to reschedule for any reason, please contact radiology at [phone] to do so. __________________________________________________________________ A barium swallow is an examination that concentrates on views of the esophagus. This tends to be a double contrast exam (barium and two liquids which, when combined, create a gas to distend the wall of the oesophagus) or single contrast (non-ionic iodine based). The study is usually tailored to your symptoms so a good history is essential. Attention is paid during the study to the form, structure and configuration of the esophagus, looking for functional disorders (such as aspiration, dysphagia, achalasia, motility and reflux) EXAMINATION You may be asked to change into a gown, depending on the type of swallow being performed. A radiologist and radiographer will perform the  procedure. The radiologist will advise you of the type of contrast selected for your procedure and direct you during the exam. You will be asked to stand, sit or lie in several different positions and to hold a small amount of fluid in your mouth before being asked to swallow while the imaging is performed .In some instances you may be asked to swallow barium coated marshmallows to assess the motility of a solid food bolus. The exam can be recorded as a digital or video fluoroscopy procedure. POST PROCEDURE It will take 1-2 days for the barium to pass through your system. To facilitate this, it is important, unless otherwise directed, to increase your fluids for the next 24-48hrs and to resume your normal diet.  This test typically takes about 30 minutes to perform. __________________________________________________________________________________  We have sent the following medications to your pharmacy for you to pick up at your convenience: Pepcid 20mg  daily at bedtime.  It was a pleasure to see you today!  Vito Cirigliano, D.O.

## 2018-08-10 ENCOUNTER — Telehealth: Payer: Self-pay | Admitting: Gastroenterology

## 2018-08-10 MED ORDER — COLESTIPOL HCL 1 G PO TABS: 2.0000 g | ORAL_TABLET | Freq: Two times a day (BID) | ORAL | 3 refills | Status: DC

## 2018-08-10 NOTE — Telephone Encounter (Signed)
Pt requested a refill on colestipol.  Prescription was written for 60 t but sig code: 2 t po BID.  She is out of medication.

## 2018-08-10 NOTE — Telephone Encounter (Signed)
Updated refill and sent to pharmacy

## 2018-08-11 ENCOUNTER — Ambulatory Visit (HOSPITAL_COMMUNITY): Payer: Medicare Other

## 2018-08-29 ENCOUNTER — Telehealth: Payer: Self-pay | Admitting: Gastroenterology

## 2018-08-29 NOTE — Telephone Encounter (Signed)
Spoke to the patient who was confused about the short cessation of medications prior to the [facility] ESOPHAGUS that was ordered for Dr. Bryan Lemma. The patient was informed to stop her PPI's (specifically Famotidine and Prevacid) but wanted to confirm if Dr. Bryan Lemma also wanted the patient to stop the Colestipol along with the other medications. This RN could not find this information in the patient's chart. Please advise.

## 2018-08-29 NOTE — Telephone Encounter (Signed)
Shouldn't be any reason to stop the Colestipol. Thanks.

## 2018-08-29 NOTE — Telephone Encounter (Signed)
During our previous phone call, the patient instructed this RN to not call if she was to continue the medication but to call if she was to discontinue the medication. Nothing further.

## 2018-09-06 ENCOUNTER — Ambulatory Visit (HOSPITAL_COMMUNITY)
Admission: RE | Admit: 2018-09-06 | Discharge: 2018-09-06 | Disposition: A | Discharge status: Home / Self Care | Payer: Medicare Other | Source: Ambulatory Visit | Attending: Gastroenterology | Admitting: Gastroenterology

## 2018-09-06 ENCOUNTER — Other Ambulatory Visit: Payer: Self-pay

## 2018-09-06 DIAGNOSIS — K219 Gastro-esophageal reflux disease without esophagitis: Secondary | ICD-10-CM | POA: Insufficient documentation

## 2018-09-06 DIAGNOSIS — R1310 Dysphagia, unspecified: Secondary | ICD-10-CM | POA: Insufficient documentation

## 2018-09-06 DIAGNOSIS — R1319 Other dysphagia: Secondary | ICD-10-CM

## 2018-09-06 IMAGING — RF ESOPHAGUS/BARIUM SWALLOW/TABLET STUDY
10 of 14 series · 12 of 24 positions shown · non-contrast
Comparison: None.

CLINICAL DATA: 67-year-old female with progressive acid reflux,
decreased effectiveness of medications. Pill dysphagia.

EXAM:
ESOPHOGRAM / BARIUM SWALLOW / BARIUM TABLET STUDY
TECHNIQUE: Combined double contrast and single contrast examination performed
using effervescent crystals, thick barium liquid, and thin barium
liquid. The patient was observed with fluoroscopy swallowing a 13 mm
barium sulphate tablet.
FLUOROSCOPY TIME:  Fluoroscopy Time:  1 minutes 6 seconds
Radiation Exposure Index (if provided by the fluoroscopic device):
10.8 mGy
Number of Acquired Spot Images: 0

[Series 1: cp_standard · 0.51mm/px · 1 of 4 frames shown (1 of 10)]
[frame 3/4]
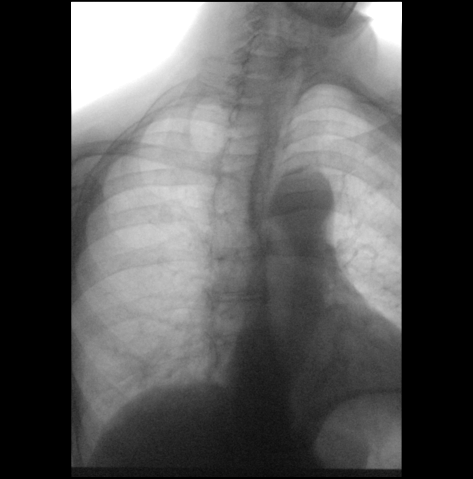

[Series 2: cp_standard · 0.51mm/px · 2 of 86 frames shown (2 of 10)]
[frame 13/86]
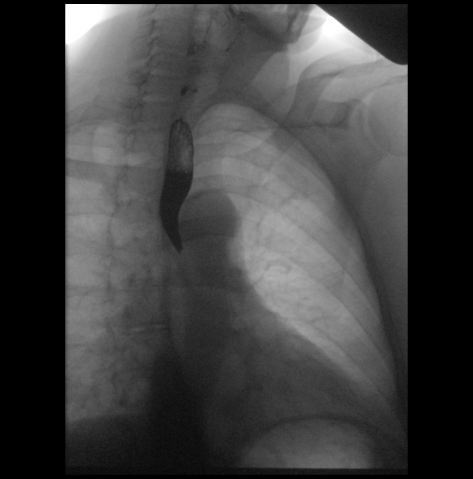
[frame 74/86]
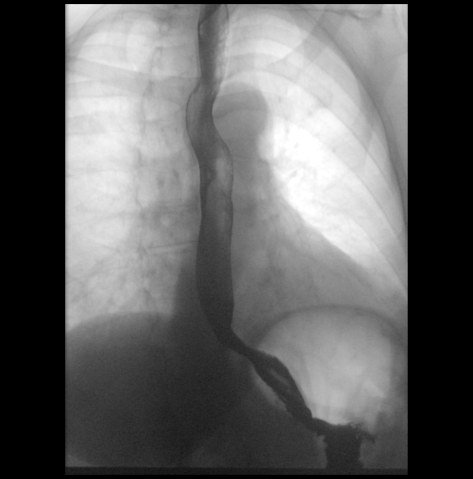

[Series 4: cp_standard · 0.17mm/px · 1 of 1 slices shown (3 of 10)]
[im 1/1]
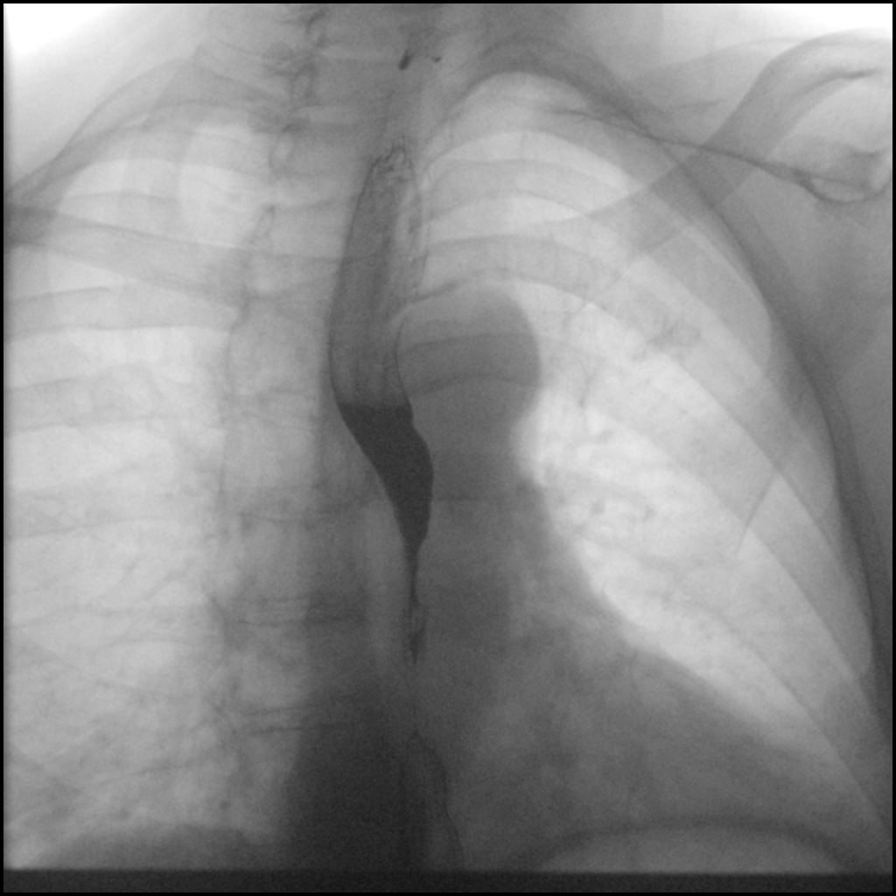

[Series 7: cp_standard · 0.17mm/px · 1 of 1 slices shown (4 of 10)]
[im 1/1]
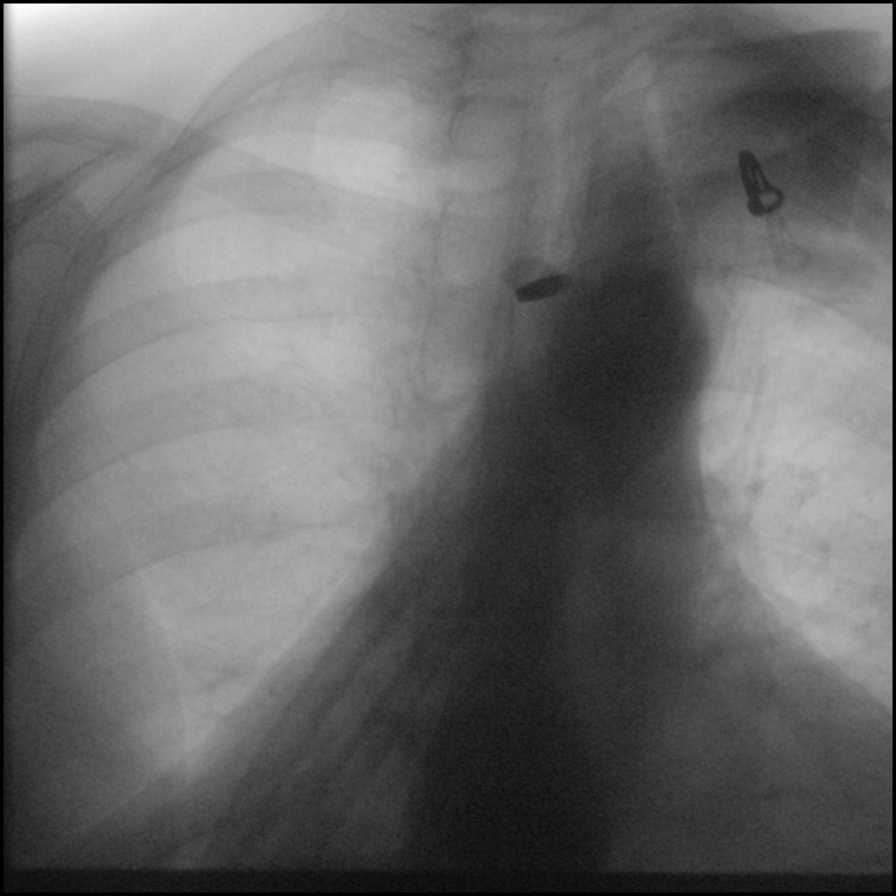

[Series 9: cp_standard · 0.17mm/px · 1 of 1 slices shown (5 of 10)]
[im 1/1]
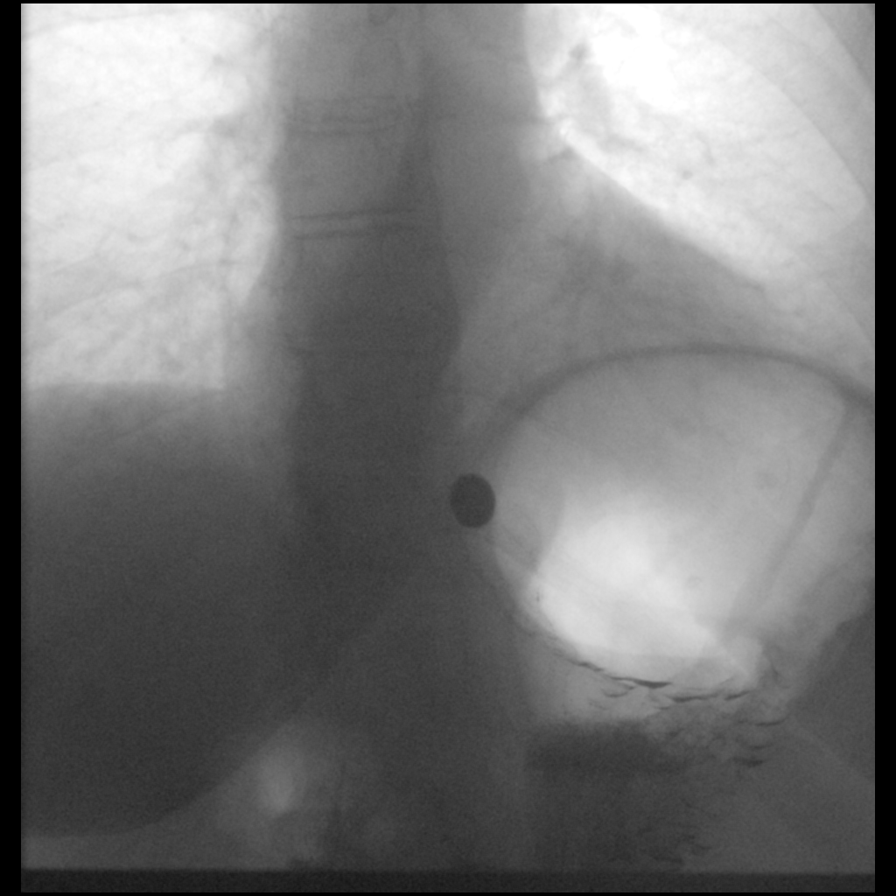

[Series 10: cp_standard · 0.34mm/px · 1 of 107 frames shown (6 of 10)]
[frame 54/107]
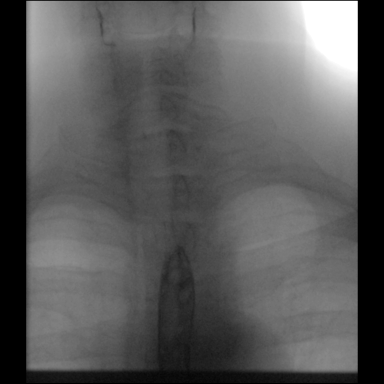

[Series 11: cp_standard · 0.34mm/px · 2 of 18 frames shown (7 of 10)]
[frame 10/18]
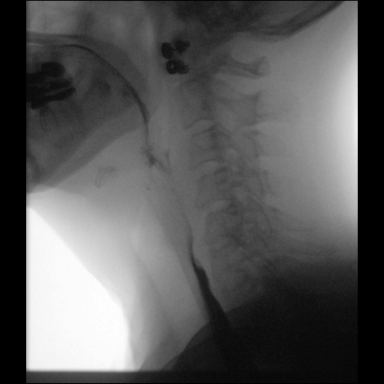
[frame 17/18]
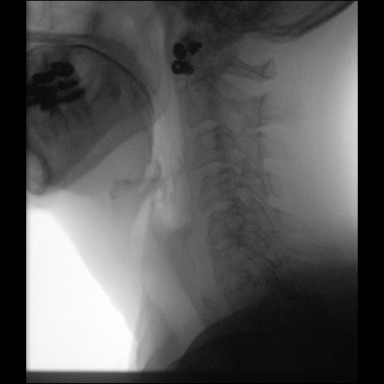

[Series 12: cp_standard · 0.37mm/px · 1 of 120 frames shown (8 of 10)]
[frame 61/120]
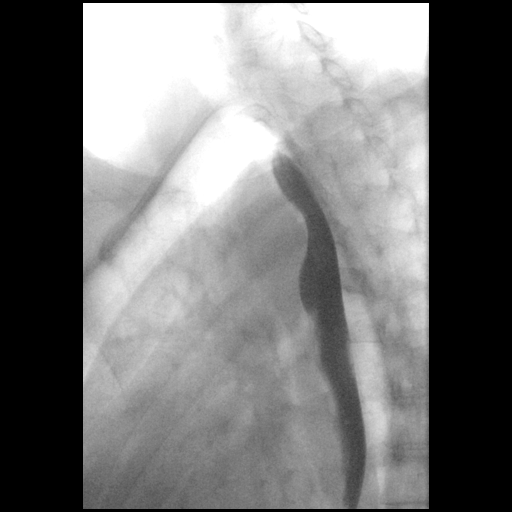

[Series 13: cp_standard · 0.18mm/px · 1 of 1 slices shown (9 of 10)]
[im 1/1]
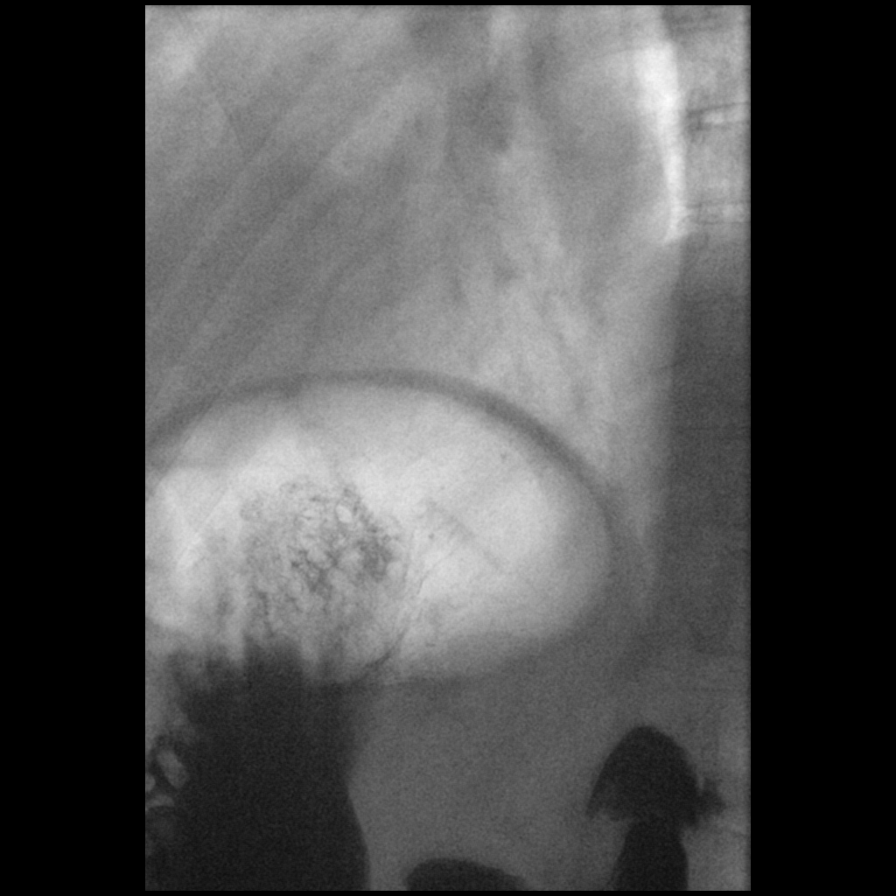

[Series 15: cp_standard · 0.28mm/px · 1 of 1 slices shown (10 of 10)]
[im 1/1]
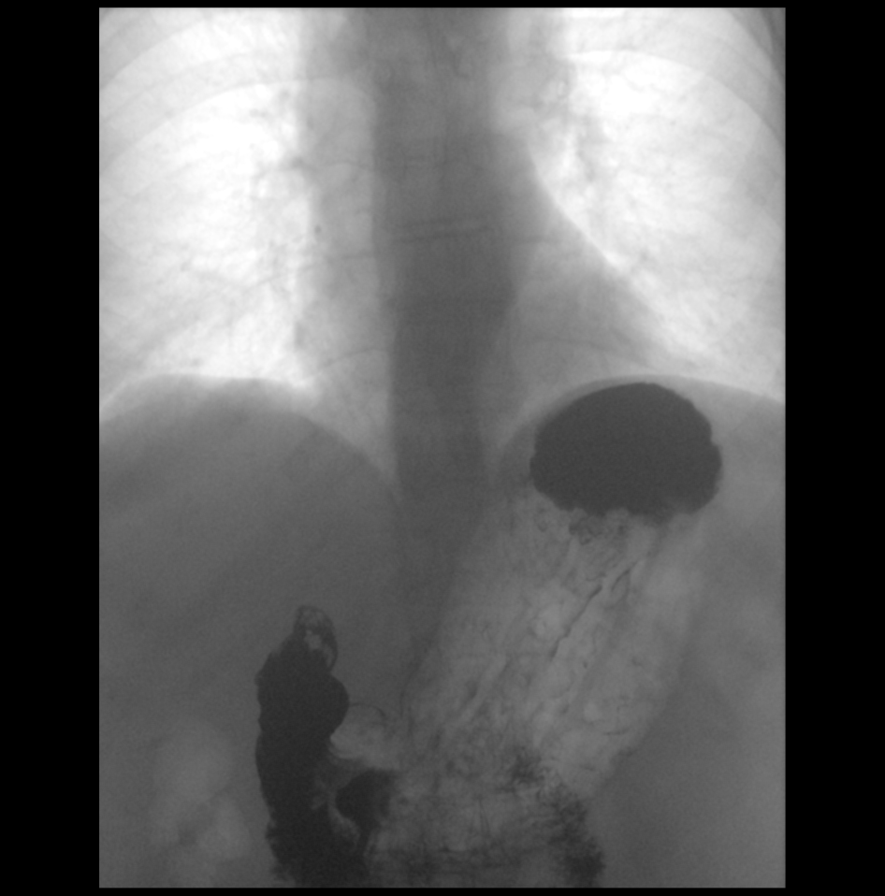

[12 of 24 positions shown; findings below may reference images not displayed]

FINDINGS: A double contrast study was undertaken and the patient tolerated
this well and without difficulty.

No obstruction to the forward flow of contrast throughout the
esophagus and into the stomach. Normal esophageal course and
contour. Normal esophageal mucosal pattern. Normal gastroesophageal
junction.

A 12.5 mm barium tablet was administered and passed freely to the
stomach without delay.

Dedicated imaging of the cervical esophagus is unremarkable.

With prone swallows esophageal motility is normal for age.

No gastroesophageal reflux occurred spontaneously or was elicited.
Prompt gastric emptying incidentally noted.
IMPRESSION: Normal esophagram.

## 2018-10-18 ENCOUNTER — Other Ambulatory Visit: Payer: Self-pay | Admitting: Primary Care

## 2018-10-18 DIAGNOSIS — E039 Hypothyroidism, unspecified: Secondary | ICD-10-CM

## 2018-10-26 ENCOUNTER — Other Ambulatory Visit: Payer: Self-pay | Admitting: *Deleted

## 2018-10-26 ENCOUNTER — Telehealth: Payer: Self-pay | Admitting: *Deleted

## 2018-10-26 MED ORDER — VIBERZI 100 MG PO TABS: 100.0000 mg | ORAL_TABLET | Freq: Two times a day (BID) | ORAL | 0 refills | Status: DC

## 2018-10-26 NOTE — Telephone Encounter (Signed)
Viberzi 100 mg BID #30 sent to pharmacy on file. MyChart message sent.

## 2018-11-01 ENCOUNTER — Other Ambulatory Visit: Payer: Self-pay | Admitting: *Deleted

## 2018-11-01 NOTE — Telephone Encounter (Signed)
Attempted to send the Viberzi script, unable to due to this medication being a controlled substance. Please write and send this prescription.

## 2018-11-03 ENCOUNTER — Telehealth: Payer: Self-pay

## 2018-11-03 NOTE — Telephone Encounter (Signed)
Christie Nap NP with [facility] is with pt on speaker phone; for 6 wks pt has had 2 episodes of vertigo where room was spinning; episodes lasted 1 min. Pt had foot surgery years ago;no recent injury and both feet hurt worse at night at the base of big toes where bunions used to be. Hurts continuously and is worse with shoes on but always hurts worse at night when trying to sleep. Pt has tried lidocaine cream but does not help.pt had cortisone shot approx 3 yrs ago but does not want a cortisone shot. Pt has no covid symptoms except diarrhea, no travel and no known exposure to + covid.pt last watery loose diarrhea was one hour ago. Pt is seeing GI about the diarrhea but wanted Anda Kraft to know,. Pt scheduled virtual appt 11/04/18 at 7:40. Pt wants to make sure since having some diarrhea issues that she will be able to do her CPX on 12/05/18 in office. Ed precautions given. FYI to Gentry Fitz NP.

## 2018-11-03 NOTE — Telephone Encounter (Signed)
Noted, will evaluate. 

## 2018-11-04 ENCOUNTER — Ambulatory Visit (INDEPENDENT_AMBULATORY_CARE_PROVIDER_SITE_OTHER): Payer: Medicare Other | Admitting: Primary Care

## 2018-11-04 ENCOUNTER — Other Ambulatory Visit: Payer: Self-pay

## 2018-11-04 ENCOUNTER — Encounter: Payer: Self-pay | Admitting: Primary Care

## 2018-11-04 ENCOUNTER — Encounter: Payer: Self-pay | Admitting: *Deleted

## 2018-11-04 ENCOUNTER — Other Ambulatory Visit: Payer: Self-pay | Admitting: *Deleted

## 2018-11-04 VITALS — BP 122/62 | Temp 97.2°F | Wt 178.1 lb

## 2018-11-04 DIAGNOSIS — R002 Palpitations: Secondary | ICD-10-CM | POA: Diagnosis not present

## 2018-11-04 DIAGNOSIS — M79673 Pain in unspecified foot: Secondary | ICD-10-CM

## 2018-11-04 DIAGNOSIS — R42 Dizziness and giddiness: Secondary | ICD-10-CM | POA: Diagnosis not present

## 2018-11-04 DIAGNOSIS — G8929 Other chronic pain: Secondary | ICD-10-CM | POA: Diagnosis not present

## 2018-11-04 MED ORDER — DICLOFENAC SODIUM 1 % TD GEL: 2.0000 g | Freq: Three times a day (TID) | TRANSDERMAL | 0 refills | Status: DC | PRN

## 2018-11-04 NOTE — Progress Notes (Unsigned)
entered in error   

## 2018-11-04 NOTE — Progress Notes (Signed)
Subjective:    Patient ID: Christie Wells, female    DOB: [birth year], 68 y.o.   MRN: [id number]  HPI  Virtual Visit via Video Note  I connected with Christie Wells on 11/04/18 at  7:40 AM EDT by a video enabled telemedicine application and verified that I am speaking with the correct person using two identifiers.  Location: Patient: Home Provider: Office   I discussed the limitations of evaluation and management by telemedicine and the availability of in person appointments. The patient expressed understanding and agreed to proceed.  History of Present Illness:  Christie Wells is a 68 year odl female with a history of migraines, hypothyroidism, vitamin B 12 and D deficiency, chronic diarrhea, back pain who presents today with multiple complaints.  1) Dizziness: Initial episode began about 6 weeks ago with a sensation that the room was spinning while she was standing. She felt like "someone gave me a drug". This lasted for three minutes total and then completely resolved. This occurred a second time (3 weeks ago) while in her home while doing chores, lasting a few minutes. She denies facial drooping, slurred speech, unilateral weakness. No symptoms since.  2) Foot Pain: History of foot surgery years ago to bilateral feet, endorses several screws were placed at the time. Today she endorses bilateral medial foot pain to the base of the great toes where "my bunions" are located. That side of her feet are very sensitive. Her pain is constant, worse when wearing closed shoes and at night when trying to sleep. Pain during the day isn't so bad during the day. She has tried OTC lidocaine cream without improvement, also underwent cortisone injection several years ago when seeing podiatry.   3) Palpitations: Occurred three times total, twice at home and once [place] the [place]. Describes symptoms as "heart racing" sensation that will last for 5-10 minutes on average. She's tried taking deep  breaths to slow her rate down. Her most recent episode was about 10 days ago. No episodes since then.    Observations/Objective:  Alert and oriented. Appears well, not sickly. No distress. Speaking in complete sentences.   Assessment and Plan:  See problem based charting.  Follow Up Instructions:  You can apply the diclofenac gel up to three times daily as needed for pain.  Call the main office to schedule a lab only appointment and ECG as discussed.  You can try Meclizine if needed for vertigo symptoms.  Please call me if you develop palpitations and/or the dizziness prior to our meeting in September.   It was a pleasure to see you today! Allie Bossier, NP-C     I discussed the assessment and treatment plan with the patient. The patient was provided an opportunity to ask questions and all were answered. The patient agreed with the plan and demonstrated an understanding of the instructions.   The patient was advised to call back or seek an in-person evaluation if the symptoms worsen or if the condition fails to improve as anticipated.  Pleas Koch, NP    Review of Systems  Constitutional: Negative for fever.  Respiratory: Negative for shortness of breath.   Cardiovascular: Positive for palpitations. Negative for chest pain.  Neurological: Positive for dizziness. Negative for weakness, numbness and headaches.       Past Medical History:  Diagnosis Date  . Arthritis   . Chronic headaches   . Colitis 1984  . GERD (gastroesophageal reflux disease)   .  Hypothyroidism   . Migraines   . UTI (urinary tract infection)      Social History   Socioeconomic History  . Marital status: Divorced    Spouse name: Not on file  . Number of children: 2  . Years of education: Not on file  . Highest education level: Not on file  Occupational History  . Not on file  Social Needs  . Financial resource strain: Not on file  . Food insecurity    Worry: Not on file     Inability: Not on file  . Transportation needs    Medical: Not on file    Non-medical: Not on file  Tobacco Use  . Smoking status: Never Smoker  . Smokeless tobacco: Never Used  Substance and Sexual Activity  . Alcohol use: Yes    Comment: occ  . Drug use: No  . Sexual activity: Not on file  Lifestyle  . Physical activity    Days per week: Not on file    Minutes per session: Not on file  . Stress: Not on file  Relationships  . Social Herbalist on phone: Not on file    Gets together: Not on file    Attends religious service: Not on file    Active member of club or organization: Not on file    Attends meetings of clubs or organizations: Not on file    Relationship status: Not on file  . Intimate partner violence    Fear of current or ex partner: Not on file    Emotionally abused: Not on file    Physically abused: Not on file    Forced sexual activity: Not on file  Other Topics Concern  . Not on file  Social History Narrative   Divorced.   2 children.   Moved from [place].   Once worked as an Journalist, newspaper.        Past Surgical History:  Procedure Laterality Date  . BUNIONECTOMY Bilateral   . COLONOSCOPY  2015  . EXCISION OF BREAST BIOPSY Left   . FOOT SURGERY Bilateral    5 screws   . TUBAL LIGATION    . UPPER GI ENDOSCOPY     Gastritis    Family History  Problem Relation Age of Onset  . Alzheimer's disease Mother   . Hypertension Mother   . Hyperlipidemia Mother   . COPD Mother   . Stroke Father   . Hypertension Father   . Heart disease Maternal Grandmother   . Parkinson's disease Paternal Grandfather   . Colon cancer Neg Hx   . Esophageal cancer Neg Hx     Allergies  Allergen Reactions  . Codeine Nausea And Vomiting    Other reaction(s): vomiting   . Peanut-Containing Drug Products     Headaches  . Sulfa Antibiotics Rash    Other reaction(s): rash    Current Outpatient Medications on File Prior to Visit  Medication  Sig Dispense Refill  . amitriptyline (ELAVIL) 10 MG tablet Take 0.5-1 tablets by mouth daily as needed.  1  . cholecalciferol (VITAMIN D) 1000 units tablet Take 3,000 Units by mouth daily.    . cyanocobalamin 1000 MCG tablet Take 1,000 mcg by mouth daily.    . Eluxadoline (VIBERZI) 100 MG TABS Take 1 tablet (100 mg total) by mouth 2 (two) times daily. 30 tablet 0  . famotidine (PEPCID) 20 MG tablet Take 1 tablet (20 mg total) by mouth at bedtime. 90 tablet  3  . gabapentin (NEURONTIN) 100 MG capsule Take 2 capsules (200 mg total) by mouth at bedtime. 180 capsule 3  . lansoprazole (PREVACID) 30 MG capsule Take 1 capsule (30 mg total) by mouth 2 (two) times daily before a meal. 180 capsule 3  . levothyroxine (SYNTHROID) 100 MCG tablet TAKE 1 TABLET BY MOUTH EVERY MORNING ON EMPTY STOMACH WITH A FULL GLASS OF WATER 90 tablet 0  . SUMAtriptan (IMITREX) 50 MG tablet Take 1 tablet by mouth at migraine onset, may repeat with second tablet in 2 hours if migraine persists. 10 tablet 1   No current facility-administered medications on file prior to visit.     BP 122/62   Temp (!) 97.2 F (36.2 C) (Oral)   Wt 178 lb 1 oz (80.8 kg)   BMI 27.27 kg/m    Objective:   Physical Exam  Constitutional: She is oriented to person, place, and time. She appears well-nourished. She does not have a sickly appearance. She does not appear ill.  Respiratory: Effort normal.  Neurological: She is alert and oriented to person, place, and time.  Psychiatric: She has a normal mood and affect.           Assessment & Plan:

## 2018-11-04 NOTE — Assessment & Plan Note (Signed)
Chronic for years, more increased pain recently. Does not wish to see podiatry again which seems reasonable at this point.  Rx for diclofenac gel sent to pharmacy. She will update.

## 2018-11-04 NOTE — Patient Instructions (Signed)
You can apply the diclofenac gel up to three times daily as needed for pain.  Call the main office to schedule a lab only appointment and ECG as discussed.  You can try Meclizine if needed for vertigo symptoms.  Please call me if you develop palpitations and/or the dizziness prior to our meeting in September.   It was a pleasure to see you today! Allie Bossier, NP-C

## 2018-11-04 NOTE — Assessment & Plan Note (Signed)
Three episodes within the last 2 months, lasting 10 minutes on average.  Differentials include PVC, atrial fibrillation, paroxysmal SVT, thyroid disorder, anemia, etc.  Check labs including TSH, CBC, CMP. Also check ECG. She will update if symptoms occur again between now and her scheduled visit in September. If this is the case then we will plan to send her to cardiology for holter monitor.

## 2018-11-04 NOTE — Assessment & Plan Note (Signed)
Symptoms represent vertigo. She does swim often. No alarm/stroke signs.  Will be checking labs and ECG for palpitations. She will notify if her episode returns.  Discussed meclizine if needed.

## 2018-11-07 ENCOUNTER — Ambulatory Visit: Payer: Medicare Other | Admitting: Primary Care

## 2018-11-07 ENCOUNTER — Other Ambulatory Visit: Payer: Self-pay

## 2018-11-07 ENCOUNTER — Ambulatory Visit (INDEPENDENT_AMBULATORY_CARE_PROVIDER_SITE_OTHER): Payer: Medicare Other | Admitting: *Deleted

## 2018-11-07 ENCOUNTER — Other Ambulatory Visit (INDEPENDENT_AMBULATORY_CARE_PROVIDER_SITE_OTHER): Payer: Medicare Other

## 2018-11-07 DIAGNOSIS — R002 Palpitations: Secondary | ICD-10-CM

## 2018-11-08 ENCOUNTER — Other Ambulatory Visit: Payer: Self-pay | Admitting: Primary Care

## 2018-11-08 DIAGNOSIS — E039 Hypothyroidism, unspecified: Secondary | ICD-10-CM

## 2018-11-08 LAB — TSH: TSH: 0.05 u[IU]/mL — ABNORMAL LOW (ref 0.35–4.50)

## 2018-11-08 LAB — COMPREHENSIVE METABOLIC PANEL
ALT: 10 U/L (ref 0–35)
AST: 15 U/L (ref 0–37)
Albumin: 4.3 g/dL (ref 3.5–5.2)
Alkaline Phosphatase: 101 U/L (ref 39–117)
BUN: 18 mg/dL (ref 6–23)
CO2: 28 mEq/L (ref 19–32)
Calcium: 9.3 mg/dL (ref 8.4–10.5)
Chloride: 106 mEq/L (ref 96–112)
Creatinine, Ser: 0.97 mg/dL (ref 0.40–1.20)
GFR: 57.31 mL/min — ABNORMAL LOW (ref >60.00)
Glucose, Bld: 86 mg/dL (ref 70–99)
Potassium: 3.9 mEq/L (ref 3.5–5.1)
Sodium: 142 mEq/L (ref 135–145)
Total Bilirubin: 1 mg/dL (ref 0.2–1.2)
Total Protein: 6.2 g/dL (ref 6.0–8.3)

## 2018-11-08 LAB — CBC
HCT: 42.3 % (ref 36.0–46.0)
Hemoglobin: 14.4 g/dL (ref 12.0–15.0)
MCHC: 34.2 g/dL (ref 30.0–36.0)
MCV: 92.5 fl (ref 78.0–100.0)
Platelets: 179 10*3/uL (ref 150.0–400.0)
RBC: 4.57 Mil/uL (ref 3.87–5.11)
RDW: 13.3 % (ref 11.5–15.5)
WBC: 7.6 10*3/uL (ref 4.0–10.5)

## 2018-11-08 LAB — LIPID PANEL
Cholesterol: 196 mg/dL (ref 0–200)
HDL: 46.8 mg/dL (ref >39.00)
LDL Cholesterol: 120 mg/dL — ABNORMAL HIGH (ref 0–99)
NonHDL: 149.68
Total CHOL/HDL Ratio: 4
Triglycerides: 147 mg/dL (ref 0.0–149.0)
VLDL: 29.4 mg/dL (ref 0.0–40.0)

## 2018-11-08 MED ORDER — LEVOTHYROXINE SODIUM 88 MCG PO TABS: ORAL_TABLET | ORAL | 1 refills | Status: DC

## 2018-11-08 NOTE — Assessment & Plan Note (Signed)
Recent TSH of 0.05 which is likely contributing to palpitations given other work up being grossly unremarkable. Reduce levothyroxine to 88 mcg and repeat labs during upcoming CPE.

## 2018-11-14 ENCOUNTER — Other Ambulatory Visit: Payer: Self-pay | Admitting: *Deleted

## 2018-11-14 ENCOUNTER — Telehealth: Payer: Self-pay | Admitting: *Deleted

## 2018-11-14 MED ORDER — RIFAXIMIN 550 MG PO TABS: 550.0000 mg | ORAL_TABLET | Freq: Three times a day (TID) | ORAL | 0 refills | Status: AC

## 2018-11-14 NOTE — Telephone Encounter (Signed)
1. Xifaxan 550 mg TID x14 days sent to [facility] for PA. 2. Food allergy testing referral sent to Dr. Tiajuana Amass of [facility].  3. Spoke with patient, patient will come to [facility] to retrieve breath test on 11/15/2018.

## 2018-11-14 NOTE — Progress Notes (Signed)
Entered in error

## 2018-11-14 NOTE — Telephone Encounter (Signed)
-----   Message from Thornton Park, MD sent at 11/14/2018 10:13 AM EDT ----- Regarding: RE: Viberzi I am still awaiting access to the system to allow for prescribing Viberzi. In the meantime, let's try a different approach to continue to address Christie Wells' diarrhea. I previously told her that we still had many things that we could do to evaluate and treat her diarrhea. I would like to further investigate her symptoms with both food allergy testing (I remember she saw an allergist in [place], but, I'm not sure if food allergy testing was performed) with a referral to Dr. Tiajuana Amass. I would also recommend a breath test for bacterial overgrowth with lactulose breath test. After completing the test, she should try Xifaxan 550 mg TID x 14 days. Please offer her an appointment with me at any time to discuss these recommendations.  Thank you. ----- Message ----- From: Dalene Seltzer, RN Sent: 11/04/2018   7:56 AM EDT To: Thornton Park, MD Subject: Delanna Notice tried to send this Viberzi script twice now, the patient keeps saying the pharmacy didn't get it. I now realized that you have to send this script because it's considered a controlled substance. Please send Dr. Tarri Glenn. Thanks.

## 2018-11-16 NOTE — Telephone Encounter (Signed)
FYI- [facility] faxed response to request for a PA for Xifaxan for this patient that states the following: "No prior authorization is required for Xifaxan. She has a high copay through medicare partD $375. We are reaching out to the patient to see if she can afford."  Dr. Tarri Glenn, please be aware that the patient may not be able to afford this medication and may need alternate therapy.

## 2018-11-16 NOTE — Telephone Encounter (Signed)
I will for now hold off on sending this script in as the patient has not informed me that she would not be able to afford the Xifaxan. If the patient cannot afford the Xifaxan, then this script will be sent to the pharmacy.

## 2018-11-16 NOTE — Telephone Encounter (Signed)
An alternative, although not nearly as effective would be Septra DS BID x 14 days. Thanks.

## 2018-11-17 ENCOUNTER — Encounter: Payer: Self-pay | Admitting: *Deleted

## 2018-11-17 ENCOUNTER — Other Ambulatory Visit: Payer: Self-pay | Admitting: *Deleted

## 2018-11-17 MED ORDER — DOXYCYCLINE HYCLATE 100 MG PO CAPS: 100.0000 mg | ORAL_CAPSULE | Freq: Two times a day (BID) | ORAL | 0 refills | Status: AC

## 2018-11-20 ENCOUNTER — Encounter: Payer: Self-pay | Admitting: Gastroenterology

## 2018-11-28 ENCOUNTER — Other Ambulatory Visit: Payer: Self-pay | Admitting: *Deleted

## 2018-11-28 MED ORDER — COLESTIPOL HCL 5 G PO GRAN: GRANULES | ORAL | 2 refills | Status: DC

## 2018-11-30 ENCOUNTER — Other Ambulatory Visit: Payer: Self-pay | Admitting: Primary Care

## 2018-11-30 ENCOUNTER — Ambulatory Visit: Payer: Medicare Other

## 2018-11-30 DIAGNOSIS — G43101 Migraine with aura, not intractable, with status migrainosus: Secondary | ICD-10-CM

## 2018-12-01 ENCOUNTER — Other Ambulatory Visit: Payer: Self-pay | Admitting: *Deleted

## 2018-12-01 MED ORDER — CHOLESTYRAMINE 4 GM/DOSE PO POWD: 4.0000 g | Freq: Two times a day (BID) | ORAL | 3 refills | Status: DC

## 2018-12-02 ENCOUNTER — Telehealth: Payer: Self-pay | Admitting: Gastroenterology

## 2018-12-02 NOTE — Telephone Encounter (Signed)
Called [facility], left a message to make sure [facility] received all of appropriate records.

## 2018-12-02 NOTE — Telephone Encounter (Signed)
Thank you. I would like a copy of the report details. Thank you.

## 2018-12-05 ENCOUNTER — Encounter: Payer: Medicare Other | Admitting: Primary Care

## 2018-12-08 ENCOUNTER — Telehealth: Payer: Self-pay | Admitting: *Deleted

## 2018-12-08 NOTE — Telephone Encounter (Signed)
Followed up with [facility]. Patient scheduled with Dr. Tiajuana Amass on 10/30 at 8:45 am.

## 2018-12-10 ENCOUNTER — Other Ambulatory Visit: Payer: Self-pay | Admitting: Gastroenterology

## 2018-12-23 ENCOUNTER — Other Ambulatory Visit: Payer: Self-pay | Admitting: Primary Care

## 2018-12-23 DIAGNOSIS — E039 Hypothyroidism, unspecified: Secondary | ICD-10-CM

## 2018-12-23 DIAGNOSIS — E559 Vitamin D deficiency, unspecified: Secondary | ICD-10-CM

## 2018-12-23 DIAGNOSIS — E538 Deficiency of other specified B group vitamins: Secondary | ICD-10-CM

## 2018-12-26 ENCOUNTER — Other Ambulatory Visit (INDEPENDENT_AMBULATORY_CARE_PROVIDER_SITE_OTHER): Payer: Medicare Other

## 2018-12-26 DIAGNOSIS — E559 Vitamin D deficiency, unspecified: Secondary | ICD-10-CM

## 2018-12-26 DIAGNOSIS — E538 Deficiency of other specified B group vitamins: Secondary | ICD-10-CM

## 2018-12-26 DIAGNOSIS — E039 Hypothyroidism, unspecified: Secondary | ICD-10-CM | POA: Diagnosis not present

## 2018-12-27 LAB — TSH: TSH: 0.42 u[IU]/mL (ref 0.35–4.50)

## 2018-12-27 LAB — BASIC METABOLIC PANEL
BUN: 25 mg/dL — ABNORMAL HIGH (ref 6–23)
CO2: 31 mEq/L (ref 19–32)
Calcium: 9.5 mg/dL (ref 8.4–10.5)
Chloride: 102 mEq/L (ref 96–112)
Creatinine, Ser: 0.93 mg/dL (ref 0.40–1.20)
GFR: 60.14 mL/min (ref >60.00)
Glucose, Bld: 91 mg/dL (ref 70–99)
Potassium: 3.8 mEq/L (ref 3.5–5.1)
Sodium: 141 mEq/L (ref 135–145)

## 2018-12-27 LAB — VITAMIN B12: Vitamin B-12: 453 pg/mL (ref 211–911)

## 2018-12-27 LAB — VITAMIN D 25 HYDROXY (VIT D DEFICIENCY, FRACTURES): VITD: 46.05 ng/mL (ref 30.00–100.00)

## 2018-12-28 ENCOUNTER — Encounter: Payer: Self-pay | Admitting: Primary Care

## 2018-12-28 ENCOUNTER — Ambulatory Visit (INDEPENDENT_AMBULATORY_CARE_PROVIDER_SITE_OTHER): Payer: Medicare Other | Admitting: Primary Care

## 2018-12-28 ENCOUNTER — Other Ambulatory Visit: Payer: Self-pay

## 2018-12-28 ENCOUNTER — Other Ambulatory Visit: Payer: Self-pay | Admitting: Primary Care

## 2018-12-28 VITALS — BP 122/84 | HR 61 | Temp 97.1°F | Ht 67.0 in | Wt 175.5 lb

## 2018-12-28 DIAGNOSIS — E039 Hypothyroidism, unspecified: Secondary | ICD-10-CM

## 2018-12-28 DIAGNOSIS — K219 Gastro-esophageal reflux disease without esophagitis: Secondary | ICD-10-CM

## 2018-12-28 DIAGNOSIS — R197 Diarrhea, unspecified: Secondary | ICD-10-CM | POA: Diagnosis not present

## 2018-12-28 DIAGNOSIS — R002 Palpitations: Secondary | ICD-10-CM

## 2018-12-28 DIAGNOSIS — Z0000 Encounter for general adult medical examination without abnormal findings: Secondary | ICD-10-CM | POA: Insufficient documentation

## 2018-12-28 DIAGNOSIS — Z23 Encounter for immunization: Secondary | ICD-10-CM

## 2018-12-28 DIAGNOSIS — G43101 Migraine with aura, not intractable, with status migrainosus: Secondary | ICD-10-CM

## 2018-12-28 DIAGNOSIS — G8929 Other chronic pain: Secondary | ICD-10-CM

## 2018-12-28 DIAGNOSIS — M545 Low back pain, unspecified: Secondary | ICD-10-CM

## 2018-12-28 DIAGNOSIS — Z1231 Encounter for screening mammogram for malignant neoplasm of breast: Secondary | ICD-10-CM

## 2018-12-28 DIAGNOSIS — E538 Deficiency of other specified B group vitamins: Secondary | ICD-10-CM

## 2018-12-28 DIAGNOSIS — E559 Vitamin D deficiency, unspecified: Secondary | ICD-10-CM

## 2018-12-28 NOTE — Assessment & Plan Note (Addendum)
Chronic, overall stable on gabapentin HS. Using sumatriptan PRN with improvement.  Continue current regimen.

## 2018-12-28 NOTE — Patient Instructions (Signed)
Be sure to take your levothyroxine (thyroid medication) every morning on an empty stomach with water only. No food or other medications for 30 minutes. No heartburn medication, iron pills, calcium, vitamin D, or magnesium pills within four hours of taking levothyroxine.   Please message me with the name and date of your last pneumonia vaccination.   Start exercising. You should be getting 150 minutes of moderate intensity exercise weekly.  It's important to improve your diet by reducing consumption of fast food, fried food, processed snack foods, sugary drinks. Increase consumption of fresh vegetables and fruits, whole grains, water.  Ensure you are drinking 64 ounces of water daily.  Call the breast center for your mammogram.   It was a pleasure to see you today!   Preventive Care 77 Years and Older, Female Preventive care refers to lifestyle choices and visits with your health care provider that can promote health and wellness. This includes:  A yearly physical exam. This is also called an annual well check.  Regular dental and eye exams.  Immunizations.  Screening for certain conditions.  Healthy lifestyle choices, such as diet and exercise. What can I expect for my preventive care visit? Physical exam Your health care provider will check:  Height and weight. These may be used to calculate body mass index (BMI), which is a measurement that tells if you are at a healthy weight.  Heart rate and blood pressure.  Your skin for abnormal spots. Counseling Your health care provider may ask you questions about:  Alcohol, tobacco, and drug use.  Emotional well-being.  Home and relationship well-being.  Sexual activity.  Eating habits.  History of falls.  Memory and ability to understand (cognition).  Work and work Statistician.  Pregnancy and menstrual history. What immunizations do I need?  Influenza (flu) vaccine  This is recommended every year. Tetanus,  diphtheria, and pertussis (Tdap) vaccine  You may need a Td booster every 10 years. Varicella (chickenpox) vaccine  You may need this vaccine if you have not already been vaccinated. Zoster (shingles) vaccine  You may need this after age 12. Pneumococcal conjugate (PCV13) vaccine  One dose is recommended after age 50. Pneumococcal polysaccharide (PPSV23) vaccine  One dose is recommended after age 52. Measles, mumps, and rubella (MMR) vaccine  You may need at least one dose of MMR if you were born in 1957 or later. You may also need a second dose. Meningococcal conjugate (MenACWY) vaccine  You may need this if you have certain conditions. Hepatitis A vaccine  You may need this if you have certain conditions or if you travel or work in places where you may be exposed to hepatitis A. Hepatitis B vaccine  You may need this if you have certain conditions or if you travel or work in places where you may be exposed to hepatitis B. Haemophilus influenzae type b (Hib) vaccine  You may need this if you have certain conditions. You may receive vaccines as individual doses or as more than one vaccine together in one shot (combination vaccines). Talk with your health care provider about the risks and benefits of combination vaccines. What tests do I need? Blood tests  Lipid and cholesterol levels. These may be checked every 5 years, or more frequently depending on your overall health.  Hepatitis C test.  Hepatitis B test. Screening  Lung cancer screening. You may have this screening every year starting at age 19 if you have a 30-pack-year history of smoking and currently smoke or  have quit within the past 15 years.  Colorectal cancer screening. All adults should have this screening starting at age 80 and continuing until age 2. Your health care provider may recommend screening at age 60 if you are at increased risk. You will have tests every 1-10 years, depending on your results and  the type of screening test.  Diabetes screening. This is done by checking your blood sugar (glucose) after you have not eaten for a while (fasting). You may have this done every 1-3 years.  Mammogram. This may be done every 1-2 years. Talk with your health care provider about how often you should have regular mammograms.  BRCA-related cancer screening. This may be done if you have a family history of breast, ovarian, tubal, or peritoneal cancers. Other tests  Sexually transmitted disease (STD) testing.  Bone density scan. This is done to screen for osteoporosis. You may have this done starting at age 69. Follow these instructions at home: Eating and drinking  Eat a diet that includes fresh fruits and vegetables, whole grains, lean protein, and low-fat dairy products. Limit your intake of foods with high amounts of sugar, saturated fats, and salt.  Take vitamin and mineral supplements as recommended by your health care provider.  Do not drink alcohol if your health care provider tells you not to drink.  If you drink alcohol: ? Limit how much you have to 0-1 drink a day. ? Be aware of how much alcohol is in your drink. In the [place] one drink equals one 12 oz bottle of beer (355 mL), one 5 oz glass of wine (148 mL), or one 1 oz glass of hard liquor (44 mL). Lifestyle  Take daily care of your teeth and gums.  Stay active. Exercise for at least 30 minutes on 5 or more days each week.  Do not use any products that contain nicotine or tobacco, such as cigarettes, e-cigarettes, and chewing tobacco. If you need help quitting, ask your health care provider.  If you are sexually active, practice safe sex. Use a condom or other form of protection in order to prevent STIs (sexually transmitted infections).  Talk with your health care provider about taking a low-dose aspirin or statin. What's next?  Go to your health care provider once a year for a well check visit.  Ask your health care  provider how often you should have your eyes and teeth checked.  Stay up to date on all vaccines. This information is not intended to replace advice given to you by your health care provider. Make sure you discuss any questions you have with your health care provider. Document Released: 03/29/2015 Document Revised: 02/24/2018 Document Reviewed: 02/24/2018 Elsevier Patient Education  2020 [employer].

## 2018-12-28 NOTE — Assessment & Plan Note (Signed)
Compliant to vitamin D, recent D level stable. Continue same.

## 2018-12-28 NOTE — Assessment & Plan Note (Addendum)
Compliant to levothyroxine 88 mcg and is taking inappropriately. She is taking PPI and vitamin D 30 minutes after levothyroxine.   TSH stable on recent labs. Discussed appropriate administration. Continue to monitor.

## 2018-12-28 NOTE — Progress Notes (Signed)
Subjective:    Patient ID: Christie Wells, female    DOB: 12-04-1950, 68 y.o.   MRN: [id number]  HPI  Christie Wells is a 68 year old female who presents today for complete physical.  Immunizations: -Tetanus: Completed in 2016 -Influenza: Due today -Pneumonia: Completed "a pneumonia" vaccination before -Shingles: Completed Shingrix course  Diet: She endorses a fair diet. She is eating both take out and home cooked meals. Desserts daily. Drinking un-sweet tea and water.  Exercise: She is not exercising, plans on starting again  Eye exam:  Completed in 2019 Dental exam: Completes annually Colonoscopy: Completed in 2020, due in 2023 Mammogram: Completed in 2018, due. Dexa: Completed in 2019 Hep C Screen: Negative   BP Readings from Last 3 Encounters:  12/28/18 122/84  11/04/18 122/62  04/01/18 120/76     Review of Systems  Constitutional: Negative for unexpected weight change.  HENT: Negative for rhinorrhea.   Respiratory: Negative for cough and shortness of breath.   Cardiovascular: Negative for chest pain.  Gastrointestinal: Negative for constipation.       Diarrhea improved  Genitourinary: Negative for difficulty urinating.  Musculoskeletal: Negative for arthralgias and myalgias.  Skin: Negative for rash.  Allergic/Immunologic: Negative for environmental allergies.  Neurological: Negative for dizziness, numbness and headaches.  Psychiatric/Behavioral: The patient is not nervous/anxious.        Past Medical History:  Diagnosis Date  . Arthritis   . Chronic headaches   . Colitis 1984  . GERD (gastroesophageal reflux disease)   . Hypothyroidism   . Migraines   . UTI (urinary tract infection)      Social History   Socioeconomic History  . Marital status: Divorced    Spouse name: Not on file  . Number of children: 2  . Years of education: Not on file  . Highest education level: Not on file  Occupational History  . Not on file  Social Needs  . Financial  resource strain: Not on file  . Food insecurity    Worry: Not on file    Inability: Not on file  . Transportation needs    Medical: Not on file    Non-medical: Not on file  Tobacco Use  . Smoking status: Never Smoker  . Smokeless tobacco: Never Used  Substance and Sexual Activity  . Alcohol use: Yes    Comment: occ  . Drug use: No  . Sexual activity: Not on file  Lifestyle  . Physical activity    Days per week: Not on file    Minutes per session: Not on file  . Stress: Not on file  Relationships  . Social Herbalist on phone: Not on file    Gets together: Not on file    Attends religious service: Not on file    Active member of club or organization: Not on file    Attends meetings of clubs or organizations: Not on file    Relationship status: Not on file  . Intimate partner violence    Fear of current or ex partner: Not on file    Emotionally abused: Not on file    Physically abused: Not on file    Forced sexual activity: Not on file  Other Topics Concern  . Not on file  Social History Narrative   Divorced.   2 children.   Moved from [place].   Once worked as an Journalist, newspaper.        Past Surgical History:  Procedure  Laterality Date  . BUNIONECTOMY Bilateral   . COLONOSCOPY  2015  . EXCISION OF BREAST BIOPSY Left   . FOOT SURGERY Bilateral    5 screws   . TUBAL LIGATION    . UPPER GI ENDOSCOPY     Gastritis    Family History  Problem Relation Age of Onset  . Alzheimer's disease Mother   . Hypertension Mother   . Hyperlipidemia Mother   . COPD Mother   . Stroke Father   . Hypertension Father   . Heart disease Maternal Grandmother   . Parkinson's disease Paternal Grandfather   . Colon cancer Neg Hx   . Esophageal cancer Neg Hx     Allergies  Allergen Reactions  . Codeine Nausea And Vomiting    Other reaction(s): vomiting   . Peanut-Containing Drug Products     Headaches  . Sulfa Antibiotics Rash    Other reaction(s): rash     Current Outpatient Medications on File Prior to Visit  Medication Sig Dispense Refill  . amitriptyline (ELAVIL) 10 MG tablet Take 0.5-1 tablets by mouth daily as needed.  1  . cholecalciferol (VITAMIN D) 1000 units tablet Take 3,000 Units by mouth daily.    . cholestyramine (QUESTRAN) 4 GM/DOSE powder Take 1 packet (4 g total) by mouth 2 (two) times daily with a meal. 60 packet 3  . colestipol (COLESTID) 5 g granules Take 1 packet each morning for 7 days, then increase to 1 packet in the morning and evening if symptoms have not improved. 500 g 2  . cyanocobalamin 1000 MCG tablet Take 1,000 mcg by mouth daily.    . diclofenac sodium (VOLTAREN) 1 % GEL Apply 2 g topically 3 (three) times daily as needed. For pain. 100 g 0  . gabapentin (NEURONTIN) 100 MG capsule Take 2 capsules (200 mg total) by mouth at bedtime. 180 capsule 3  . lansoprazole (PREVACID) 30 MG capsule Take 1 capsule (30 mg total) by mouth 2 (two) times daily before a meal. 180 capsule 3  . levothyroxine (SYNTHROID) 88 MCG tablet Take 1 tablet by mouth every morning on an empty stomach with water only.  No food or other medications for 30 minutes. 30 tablet 1  . SUMAtriptan (IMITREX) 50 MG tablet TAKE 1 TABLET BY MOUTH AT MIGRAINE ONSET, MAY REPEAT WITH SECOND TABLET IN 2 HOURS IF MIGRAINE PERSISTS. 10 tablet 0  . [DISCONTINUED] colestipol (COLESTID) 1 g tablet Take 2 tablets (2 g total) by mouth 2 (two) times daily. (Patient not taking: Reported on 11/04/2018) 120 tablet 3   No current facility-administered medications on file prior to visit.     BP 122/84   Pulse 61   Temp (!) 97.1 F (36.2 C) (Temporal)   Ht 5\' 7"  (1.702 m)   Wt 175 lb 8 oz (79.6 kg)   SpO2 97%   BMI 27.49 kg/m    Objective:   Physical Exam  Constitutional: She is oriented to person, place, and time. She appears well-nourished.  HENT:  Right Ear: Tympanic membrane and ear canal normal.  Left Ear: Tympanic membrane and ear canal normal.   Mouth/Throat: Oropharynx is clear and moist.  Eyes: Pupils are equal, round, and reactive to light. EOM are normal.  Neck: Neck supple.  Cardiovascular: Normal rate and regular rhythm.  Respiratory: Effort normal and breath sounds normal.  GI: Soft. Bowel sounds are normal. There is no abdominal tenderness.  Musculoskeletal: Normal range of motion.  Neurological: She is alert and oriented  to person, place, and time.  Skin: Skin is warm and dry.  Psychiatric: She has a normal mood and affect.           Assessment & Plan:

## 2018-12-28 NOTE — Assessment & Plan Note (Signed)
Immunizations UTD except for pneumonia. She will call us to notify the date and which type of pneumonia vaccination she had.  Mammogram due, ordered. Colonoscopy UTD, due in 2023. Bone density scan due in 2021. Encouraged regular exercise, healthy diet. Exam today unremarkable. Labs reviewed.

## 2018-12-28 NOTE — Assessment & Plan Note (Signed)
Following with GI, recently treated with Doxycycline with improvement temporarily. Will start Questran soon. She has an appointment with GI tomorrow.

## 2018-12-28 NOTE — Assessment & Plan Note (Signed)
Compliant to B12, recent level stable. Continue to monitor.

## 2018-12-28 NOTE — Assessment & Plan Note (Signed)
Resolved.  Continue to monitor.

## 2018-12-28 NOTE — Assessment & Plan Note (Signed)
Doing well on daily Prevacid, continue same. Endoscopy from early 2020 reviewed.

## 2018-12-28 NOTE — Assessment & Plan Note (Signed)
Chronic.  Working on stretching and heat/ice.

## 2018-12-29 ENCOUNTER — Telehealth: Payer: Self-pay | Admitting: Primary Care

## 2018-12-29 ENCOUNTER — Encounter: Payer: Self-pay | Admitting: Gastroenterology

## 2018-12-29 ENCOUNTER — Ambulatory Visit (INDEPENDENT_AMBULATORY_CARE_PROVIDER_SITE_OTHER): Payer: Medicare Other | Admitting: Gastroenterology

## 2018-12-29 ENCOUNTER — Ambulatory Visit: Payer: Medicare Other | Admitting: Gastroenterology

## 2018-12-29 DIAGNOSIS — K2100 Gastro-esophageal reflux disease with esophagitis, without bleeding: Secondary | ICD-10-CM

## 2018-12-29 DIAGNOSIS — R197 Diarrhea, unspecified: Secondary | ICD-10-CM | POA: Diagnosis not present

## 2018-12-29 MED ORDER — RIFAXIMIN 550 MG PO TABS: 550.0000 mg | ORAL_TABLET | Freq: Three times a day (TID) | ORAL | 0 refills | Status: AC

## 2018-12-29 NOTE — Patient Instructions (Addendum)
Repeat Xifaxan 550 mg three time a day x 14 days. We have sent your demographic information and a prescription for Xifaxan to [facility]. This pharmacy is able to get medication approved through insurance and get you the lowest copay possible. If you have not heard from them within 1 week, please call our office at [phone] to let us know.   Hold colestipol at this time, will resume if needed in the future  Continue lansoprazole 30 mg twice a day  Patient advised to avoid spicy, acidic, citrus, chocolate, mints, fruit and fruit juices.  Limit the intake of caffeine, alcohol and Soda.  Don't exercise too soon after eating.  Don't lie down within 3-4 hours of eating.  Elevate the head of your bed.  Office visit in 2-3 months, earlier if needed

## 2018-12-29 NOTE — Addendum Note (Signed)
Addended by: Jacqualin Combes on: 12/29/2018 11:24 AM   Modules accepted: Orders

## 2018-12-29 NOTE — Telephone Encounter (Signed)
Patient called and schedule skin tag removal appointment, She also wanted you to know that she has no had her pneumonia vaccine

## 2018-12-29 NOTE — Progress Notes (Signed)
TELEHEALTH VISIT  Referring Provider: Pleas Koch, NP Primary Care Physician:  Pleas Koch, NP   Tele-visit due to COVID-19 pandemic Patient requested visit virtually, consented to the virtual encounter via audio enabled telemedicine application (Zoom, converted to phone due to poor connection) Contact made at: 12/29/18  13:10 pm Patient verified by name and date of birth Location of patient: Home Location provider: [place] [facility] Names of persons participating: Me, patient, Tinnie Gens CMA Time spent on telehealth visit: 40 minutes I discussed the limitations of evaluation and management by telemedicine. The patient expressed understanding and agreed to proceed.  Chief complaint: Diarrhea   IMPRESSION:  Diarrhea x 8 months without alarm features    - Stool 11/26/2017: negative Campylobacter, C. difficile, Crypto, E. coli, Giardia, norovirus, rotavirus, Salmonella, Shigella.   - Normal TSH 0.62 09/2017   - 01/28/18: ESR 2, CRP 0.650, normal IgA, normal TTGA, negative FOBT; fecal calprotectin 57   - normal duodenal and random colon biopsies 04/01/18 GERD incompletely controlled on lansoprazole    - 04/01/18: esophagus normal on EGD, biopsies consistent with esophagitis    - normal esophagram 09/06/18 Pill dysphagia at the level of the sternal notch    - No eosinophilic esophagitis on EGD biopsies 04/01/2018 Cholelithiasis on ultrasound 12/08/2017 Prior use of NSAIDs for headaches Nonbleeding internal hemorrhoids seen on prior colonoscopy History of colon polyps    - Colonoscopy in [place] 12/29/2013    - 3 tubular adenomas on colonoscopy 04/01/2018    - Surveillance colonoscopy due 2023  Etiology of chronic diarrhea without alarm features is 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  Diarrhea had improved on a higher dose of colestipol although bowel habits have not yet returned to normal. But,  cholestipol is not going to be an option for long-term management. She is wanting to avoid long-term use of prescription medications.   On PPI for GERD, although lansoprazole may be exacerbating her diarrhea. Esophageal biopsies were positive for reflux 04/01/18 although the esophagus appeared normal. Recent esophagram was normal.  She would like to stop using PPIs if possible. Will plan 24 hour pH/impedance study in the future.  PLAN: - Repeat Doxycycline 100 mg TID x 14 days - Hold colestipol at this time, will resume if needed in the future - Use Imodium as needed - Continue to avoid lactose - Continue lansoprazole 30 mg BID, Pepcid 20 mg QHS - Continue antireflux lifestyle measures - Consider 24 hour pH/impedance study in the future - Follow-up with Dr. Bryan Lemma regarding TIF options as previously planned - Consider food diary, nutrition referral for food related intolerance - Office visit in 2-3 months, earlier if needed (30 minutes)  HPI: Christie Wells is a 68 y.o. female with chronic diarrhea. She also has a longstanding history of reflux with index symptoms of regurgitation, throat clearing, dry cough, and nocturnal choking. Last visit with me 07/27/18. She was seen by Dr. Modena Morrow to discuss TIF 08/04/18. Interval history is obtained through the patient and review of her electronic health record.  She had a normal esophagram 09/06/18. Stool are more formed on colestipol 2 g BID for diarrhea. However, she does not want to take these pills for the rest of her life and finds them difficult to swallow. We have been unsuccessful with granular forms of colestipol due to cost. Did not pick up the cholestyramine was not a good option for her due to the powder formulation.   Hydrogen and  Methane Breath test negative for SIBO 11/18/18. Unfortunately, no rise in gas at the end as of the test as expected.  I recommended an empiric trial of Xifaxan but this was cost prohibitive.  Both Septra DS and  doxycyline were discussed as alternatives. The patient was unsure which therapy she ultimately took.   11/23/18 started antibiotics, felt worse.  Stopped 9/12 after 3.5 days. Had perfect stools after those doses.   Resumed 12/10/18. Taking it with food decreased her symptoms.  Completed antibiotics 12/19/18. Formed daily BM after treatment.  12/25/18 the diarrhea returned. 12/26/18 starting feeling like something wasn't right.  She is concerned that she might be sick in her stomach.  Two episodes of diarrhea yesterday. Less epigastric abdominal pain and early morning nausea.  No blood or mucous. No constipation.  Good appetite. Weight is stable.    Past Medical History:  Diagnosis Date  . Arthritis   . Chronic headaches   . Colitis 1984  . GERD (gastroesophageal reflux disease)   . Hypothyroidism   . Migraines   . UTI (urinary tract infection)     Past Surgical History:  Procedure Laterality Date  . BUNIONECTOMY Bilateral   . COLONOSCOPY  2015  . EXCISION OF BREAST BIOPSY Left   . FOOT SURGERY Bilateral    5 screws   . TUBAL LIGATION    . UPPER GI ENDOSCOPY     Gastritis    Current Outpatient Medications  Medication Sig Dispense Refill  . amitriptyline (ELAVIL) 10 MG tablet Take 0.5-1 tablets by mouth daily as needed.  1  . cholecalciferol (VITAMIN D) 1000 units tablet Take 3,000 Units by mouth daily.    . cholestyramine (QUESTRAN) 4 GM/DOSE powder Take 1 packet (4 g total) by mouth 2 (two) times daily with a meal. 60 packet 3  . colestipol (COLESTID) 5 g granules Take 1 packet each morning for 7 days, then increase to 1 packet in the morning and evening if symptoms have not improved. 500 g 2  . cyanocobalamin 1000 MCG tablet Take 1,000 mcg by mouth daily.    . diclofenac sodium (VOLTAREN) 1 % GEL Apply 2 g topically 3 (three) times daily as needed. For pain. 100 g 0  . gabapentin (NEURONTIN) 100 MG capsule Take 2 capsules (200 mg total) by mouth at bedtime. 180 capsule 3  .  lansoprazole (PREVACID) 30 MG capsule Take 1 capsule (30 mg total) by mouth 2 (two) times daily before a meal. 180 capsule 3  . levothyroxine (SYNTHROID) 88 MCG tablet TAKE 1 TAB EVERY MORNING ON AN EMPTY STOMACH WITH WATER ONLY. NO FOOD OR OTHER MEDS FOR 30 MIN 90 tablet 1  . SUMAtriptan (IMITREX) 50 MG tablet TAKE 1 TABLET BY MOUTH AT MIGRAINE ONSET, MAY REPEAT WITH SECOND TABLET IN 2 HOURS IF MIGRAINE PERSISTS. 10 tablet 0   No current facility-administered medications for this visit.     Allergies as of 12/29/2018 - Review Complete 12/28/2018  Allergen Reaction Noted  . Codeine Nausea And Vomiting 09/19/2009  . Peanut-containing drug products  04/21/2017  . Sulfa antibiotics Rash 09/19/2009    Family History  Problem Relation Age of Onset  . Alzheimer's disease Mother   . Hypertension Mother   . Hyperlipidemia Mother   . COPD Mother   . Stroke Father   . Hypertension Father   . Heart disease Maternal Grandmother   . Parkinson's disease Paternal Grandfather   . Colon cancer Neg Hx   . Esophageal cancer Neg  Hx     Social History   Socioeconomic History  . Marital status: Divorced    Spouse name: Not on file  . Number of children: 2  . Years of education: Not on file  . Highest education level: Not on file  Occupational History  . Not on file  Social Needs  . Financial resource strain: Not on file  . Food insecurity    Worry: Not on file    Inability: Not on file  . Transportation needs    Medical: Not on file    Non-medical: Not on file  Tobacco Use  . Smoking status: Never Smoker  . Smokeless tobacco: Never Used  Substance and Sexual Activity  . Alcohol use: Yes    Comment: occ  . Drug use: No  . Sexual activity: Not on file  Lifestyle  . Physical activity    Days per week: Not on file    Minutes per session: Not on file  . Stress: Not on file  Relationships  . Social Herbalist on phone: Not on file    Gets together: Not on file    Attends  religious service: Not on file    Active member of club or organization: Not on file    Attends meetings of clubs or organizations: Not on file    Relationship status: Not on file  . Intimate partner violence    Fear of current or ex partner: Not on file    Emotionally abused: Not on file    Physically abused: Not on file    Forced sexual activity: Not on file  Other Topics Concern  . Not on file  Social History Narrative   Divorced.   2 children.   Moved from [place].   Once worked as an Journalist, newspaper.       Physical Exam: General: in no acute distress Neuro: Alert and appropriate Psych: Normal affect and normal insight   Jeree Delcid L. Tarri Glenn, MD, MPH [facility] 12/29/2018, 1:09 PM

## 2018-12-29 NOTE — Telephone Encounter (Signed)
Noted. We can provide her with Prevnar 13 during her upcoming visit.

## 2018-12-30 ENCOUNTER — Telehealth: Payer: Self-pay | Admitting: *Deleted

## 2018-12-30 ENCOUNTER — Other Ambulatory Visit: Payer: Self-pay | Admitting: *Deleted

## 2018-12-30 ENCOUNTER — Encounter: Payer: Self-pay | Admitting: *Deleted

## 2018-12-30 MED ORDER — DOXYCYCLINE HYCLATE 100 MG PO CAPS: 100.0000 mg | ORAL_CAPSULE | Freq: Two times a day (BID) | ORAL | 0 refills | Status: AC

## 2018-12-30 NOTE — Telephone Encounter (Signed)
Doxycycline 100 mg BID x 14 days script sent to the pharmacy.   Patient notified via [facility].

## 2018-12-30 NOTE — Telephone Encounter (Signed)
Spoke to the patient who informed me that she does not want the Xifaxan as we have tried before to get the patient approved for this medication and it is too expensive. She was previously prescribed Vibramycin instead of the Xifaxan. She wants to know if this 14 day course can be repeated. Please advise.

## 2018-12-30 NOTE — Telephone Encounter (Signed)
Yes. She may repeat that for 14 days. Thank you.

## 2019-01-01 ENCOUNTER — Other Ambulatory Visit: Payer: Self-pay | Admitting: Primary Care

## 2019-01-01 DIAGNOSIS — G43101 Migraine with aura, not intractable, with status migrainosus: Secondary | ICD-10-CM

## 2019-01-11 ENCOUNTER — Other Ambulatory Visit: Payer: Self-pay

## 2019-01-11 ENCOUNTER — Encounter: Payer: Self-pay | Admitting: Primary Care

## 2019-01-11 ENCOUNTER — Ambulatory Visit (INDEPENDENT_AMBULATORY_CARE_PROVIDER_SITE_OTHER): Payer: Medicare Other | Admitting: Primary Care

## 2019-01-11 VITALS — BP 126/82 | HR 60 | Temp 97.7°F | Ht 67.0 in | Wt 161.2 lb

## 2019-01-11 DIAGNOSIS — Z23 Encounter for immunization: Secondary | ICD-10-CM

## 2019-01-11 DIAGNOSIS — D229 Melanocytic nevi, unspecified: Secondary | ICD-10-CM

## 2019-01-11 NOTE — Assessment & Plan Note (Signed)
Patient requesting 2 moles to be removed. Consent obtained.  Site cleansed with Betadine solution. Analgesia used: Lidocaine 1% with epi, pain ease spray Derma blade used for shave biopsy. Silver nitrate sticks used for bleeding which was minimal. Site cleansed, bandage applied. Patient tolerated well.  Both nevi removed without difficulty.

## 2019-01-11 NOTE — Patient Instructions (Signed)
Keep the sites clean and dry for the next several days.  Please call me if you notice any redness, drainage, increased pain to the sites.  It was a pleasure to see you today!

## 2019-01-11 NOTE — Progress Notes (Signed)
Subjective:    Patient ID: Christie Wells, female    DOB: Oct 13, 1950, 68 y.o.   MRN: [id number]  HPI  [name] is a 68 year old female who presents today for nevus removal.  She has two nevi to the right lower extremity that have been present for years. The nevi are irritating, she scratches and snags them often. She denies changes in shape, size, color.   Review of Systems  Skin:       Nevi       Past Medical History:  Diagnosis Date  . Arthritis   . Chronic headaches   . Colitis 1984  . GERD (gastroesophageal reflux disease)   . Hypothyroidism   . Migraines   . UTI (urinary tract infection)      Social History   Socioeconomic History  . Marital status: Divorced    Spouse name: Not on file  . Number of children: 2  . Years of education: Not on file  . Highest education level: Not on file  Occupational History  . Not on file  Social Needs  . Financial resource strain: Not on file  . Food insecurity    Worry: Not on file    Inability: Not on file  . Transportation needs    Medical: Not on file    Non-medical: Not on file  Tobacco Use  . Smoking status: Never Smoker  . Smokeless tobacco: Never Used  Substance and Sexual Activity  . Alcohol use: Yes    Comment: occ  . Drug use: No  . Sexual activity: Not on file  Lifestyle  . Physical activity    Days per week: Not on file    Minutes per session: Not on file  . Stress: Not on file  Relationships  . Social Herbalist on phone: Not on file    Gets together: Not on file    Attends religious service: Not on file    Active member of club or organization: Not on file    Attends meetings of clubs or organizations: Not on file    Relationship status: Not on file  . Intimate partner violence    Fear of current or ex partner: Not on file    Emotionally abused: Not on file    Physically abused: Not on file    Forced sexual activity: Not on file  Other Topics Concern  . Not on file  Social  History Narrative   Divorced.   2 children.   Moved from [place].   Once worked as an Journalist, newspaper.        Past Surgical History:  Procedure Laterality Date  . BUNIONECTOMY Bilateral   . COLONOSCOPY  2015  . EXCISION OF BREAST BIOPSY Left   . FOOT SURGERY Bilateral    5 screws   . TUBAL LIGATION    . UPPER GI ENDOSCOPY     Gastritis    Family History  Problem Relation Age of Onset  . Alzheimer's disease Mother   . Hypertension Mother   . Hyperlipidemia Mother   . COPD Mother   . Stroke Father   . Hypertension Father   . Heart disease Maternal Grandmother   . Parkinson's disease Paternal Grandfather   . Colon cancer Neg Hx   . Esophageal cancer Neg Hx     Allergies  Allergen Reactions  . Codeine Nausea And Vomiting    Other reaction(s): vomiting   . Peanut-Containing Drug Products  Headaches  . Sulfa Antibiotics Rash    Other reaction(s): rash    Current Outpatient Medications on File Prior to Visit  Medication Sig Dispense Refill  . amitriptyline (ELAVIL) 10 MG tablet Take 0.5-1 tablets by mouth daily as needed.  1  . cholecalciferol (VITAMIN D) 1000 units tablet Take 3,000 Units by mouth daily.    . colestipol (COLESTID) 5 g granules Take 1 packet each morning for 7 days, then increase to 1 packet in the morning and evening if symptoms have not improved. 500 g 2  . cyanocobalamin 1000 MCG tablet Take 1,000 mcg by mouth daily.    . diclofenac sodium (VOLTAREN) 1 % GEL Apply 2 g topically 3 (three) times daily as needed. For pain. 100 g 0  . doxycycline (VIBRAMYCIN) 100 MG capsule Take 1 capsule (100 mg total) by mouth 2 (two) times daily for 14 days. 28 capsule 0  . gabapentin (NEURONTIN) 100 MG capsule TAKE 2 CAPSULES BY MOUTH AT BEDTIME 180 capsule 3  . lansoprazole (PREVACID) 30 MG capsule Take 1 capsule (30 mg total) by mouth 2 (two) times daily before a meal. 180 capsule 3  . levothyroxine (SYNTHROID) 88 MCG tablet TAKE 1 TAB EVERY MORNING ON  AN EMPTY STOMACH WITH WATER ONLY. NO FOOD OR OTHER MEDS FOR 30 MIN 90 tablet 1  . rifaximin (XIFAXAN) 550 MG TABS tablet Take 1 tablet (550 mg total) by mouth 3 (three) times daily for 14 days. 42 tablet 0  . SUMAtriptan (IMITREX) 50 MG tablet TAKE 1 TABLET BY MOUTH AT MIGRAINE ONSET, MAY REPEAT WITH SECOND TABLET IN 2 HOURS IF MIGRAINE PERSISTS. 10 tablet 0  . cholestyramine (QUESTRAN) 4 GM/DOSE powder Take 1 packet (4 g total) by mouth 2 (two) times daily with a meal. 60 packet 3  . [DISCONTINUED] colestipol (COLESTID) 1 g tablet Take 2 tablets (2 g total) by mouth 2 (two) times daily. (Patient not taking: Reported on 11/04/2018) 120 tablet 3   No current facility-administered medications on file prior to visit.     BP 126/82   Pulse 60   Temp 97.7 F (36.5 C) (Temporal)   Ht 5\' 7"  (1.702 m)   Wt 161 lb 4 oz (73.1 kg)   SpO2 98%   BMI 25.26 kg/m    Objective:   Physical Exam  Constitutional: She appears well-nourished.  Respiratory: Effort normal.  Skin: Skin is warm and dry. No erythema.  0.5 cm rounded raised whitish-brown nevus to right anterior thigh, 0.25 cm rounded raised whitish-brown nevus to right medial thigh           Assessment & Plan:

## 2019-01-12 ENCOUNTER — Other Ambulatory Visit: Payer: Self-pay | Admitting: Primary Care

## 2019-01-12 NOTE — Addendum Note (Signed)
Addended by: Jacqualin Combes on: 01/12/2019 10:02 AM   Modules accepted: Orders

## 2019-02-01 ENCOUNTER — Telehealth: Payer: Self-pay | Admitting: Primary Care

## 2019-02-01 NOTE — Telephone Encounter (Signed)
Spoken to patient and son in law tested positive so she is on her way to get tested since she watch her grandkids. She will update afterward.

## 2019-02-01 NOTE — Telephone Encounter (Signed)
Noted  

## 2019-02-01 NOTE — Telephone Encounter (Signed)
Please notify patient that if she was truly exposed (closer than 6 feet and neither the son in law or her wearing masks) then she needs to be tested, especially if she's going out of town.

## 2019-02-01 NOTE — Telephone Encounter (Signed)
Patient called today in regards to possible covid exposure   She stated that her Son in law tested for covid and they are waiting on his results to come back this week.  She stated that she watches his children so she is concerned.  the patient is going out of town on Saturday and wanted to know if she should go be tested before she leaves Patient is not wanting to take anything to her daughter out of town.   Patient would like to know what you suggest.  She did not want to schedule a virtual

## 2019-04-16 ENCOUNTER — Ambulatory Visit: Payer: Medicare Other

## 2019-04-18 ENCOUNTER — Encounter: Payer: Self-pay | Admitting: Primary Care

## 2019-04-18 ENCOUNTER — Other Ambulatory Visit: Payer: Self-pay

## 2019-04-18 ENCOUNTER — Ambulatory Visit (INDEPENDENT_AMBULATORY_CARE_PROVIDER_SITE_OTHER): Payer: Medicare Other | Admitting: Primary Care

## 2019-04-18 VITALS — BP 144/94 | HR 68 | Temp 97.6°F | Ht 67.0 in | Wt 178.2 lb

## 2019-04-18 DIAGNOSIS — E039 Hypothyroidism, unspecified: Secondary | ICD-10-CM | POA: Diagnosis not present

## 2019-04-18 DIAGNOSIS — R030 Elevated blood-pressure reading, without diagnosis of hypertension: Secondary | ICD-10-CM | POA: Diagnosis not present

## 2019-04-18 DIAGNOSIS — I10 Essential (primary) hypertension: Secondary | ICD-10-CM | POA: Insufficient documentation

## 2019-04-18 LAB — LIPID PANEL
Cholesterol: 236 mg/dL — ABNORMAL HIGH (ref 0–200)
HDL: 65 mg/dL (ref >39.00)
LDL Cholesterol: 152 mg/dL — ABNORMAL HIGH (ref 0–99)
NonHDL: 171.04
Total CHOL/HDL Ratio: 4
Triglycerides: 97 mg/dL (ref 0.0–149.0)
VLDL: 19.4 mg/dL (ref 0.0–40.0)

## 2019-04-18 LAB — BASIC METABOLIC PANEL
BUN: 19 mg/dL (ref 6–23)
CO2: 30 mEq/L (ref 19–32)
Calcium: 9.5 mg/dL (ref 8.4–10.5)
Chloride: 104 mEq/L (ref 96–112)
Creatinine, Ser: 0.96 mg/dL (ref 0.40–1.20)
GFR: 57.92 mL/min — ABNORMAL LOW (ref >60.00)
Glucose, Bld: 97 mg/dL (ref 70–99)
Potassium: 3.8 mEq/L (ref 3.5–5.1)
Sodium: 142 mEq/L (ref 135–145)

## 2019-04-18 LAB — TSH: TSH: 0.53 u[IU]/mL (ref 0.35–4.50)

## 2019-04-18 NOTE — Progress Notes (Signed)
Subjective:    Patient ID: Christie Wells, female    DOB: 03/25/1950, 69 y.o.   MRN: [id number]  HPI  This visit occurred during the SARS-CoV-2 public health emergency.  Safety protocols were in place, including screening questions prior to the visit, additional usage of staff PPE, and extensive cleaning of exam room while observing appropriate contact time as indicated for disinfecting solutions.   [name] is a 69 year old female with a history of migraines, hypothyroidism, chronic back pain, GERD, palpitations who presents today to discuss blood pressure.  She was at the orthopedics office last week, BP was 180/110, 179/108. She purchased a BP cuff and has been getting readings of 179/99, 143/90, 143/93, 144/83, 161/97, 137/91.  Since her last visit her physical activity has significantly declined. She also admits to a poor diet. Her weight ranges 171-181 throughout the year. She has a family history of hypertension and stroke in immediate family members.  Over the last several days she's been working on a [employer], is very motivated to lose weight and work on lifestyle changes. She denies chest pain, palpitations, dizziness, headaches. She does have a history of alternating TSH's, is compliant to levothyroxine 88 mcg.   BP Readings from Last 3 Encounters:  04/18/19 (!) 144/94  01/11/19 126/82  12/28/18 122/84   Wt Readings from Last 3 Encounters:  04/18/19 178 lb 4 oz (80.9 kg)  01/11/19 161 lb 4 oz (73.1 kg)  12/28/18 175 lb 8 oz (79.6 kg)     Review of Systems  Eyes: Negative for visual disturbance.  Respiratory: Negative for shortness of breath.   Cardiovascular: Negative for chest pain.  Neurological: Negative for dizziness and headaches.       Past Medical History:  Diagnosis Date  . Arthritis   . Chronic headaches   . Colitis 1984  . GERD (gastroesophageal reflux disease)   . Hypothyroidism   . Migraines   . UTI (urinary tract infection)      Social  History   Socioeconomic History  . Marital status: Divorced    Spouse name: Not on file  . Number of children: 2  . Years of education: Not on file  . Highest education level: Not on file  Occupational History  . Not on file  Tobacco Use  . Smoking status: Never Smoker  . Smokeless tobacco: Never Used  Substance and Sexual Activity  . Alcohol use: Yes    Comment: occ  . Drug use: No  . Sexual activity: Not on file  Other Topics Concern  . Not on file  Social History Narrative   Divorced.   2 children.   Moved from [place].   Once worked as an Journalist, newspaper.       Social Determinants of Health   Financial Resource Strain:   . Difficulty of Paying Living Expenses: Not on file  Food Insecurity:   . Worried About Charity fundraiser in the Last Year: Not on file  . Ran Out of Food in the Last Year: Not on file  Transportation Needs:   . Lack of Transportation (Medical): Not on file  . Lack of Transportation (Non-Medical): Not on file  Physical Activity:   . Days of Exercise per Week: Not on file  . Minutes of Exercise per Session: Not on file  Stress:   . Feeling of Stress : Not on file  Social Connections:   . Frequency of Communication with Friends and Family: Not on  file  . Frequency of Social Gatherings with Friends and Family: Not on file  . Attends Religious Services: Not on file  . Active Member of Clubs or Organizations: Not on file  . Attends Archivist Meetings: Not on file  . Marital Status: Not on file  Intimate Partner Violence:   . Fear of Current or Ex-Partner: Not on file  . Emotionally Abused: Not on file  . Physically Abused: Not on file  . Sexually Abused: Not on file    Past Surgical History:  Procedure Laterality Date  . BUNIONECTOMY Bilateral   . COLONOSCOPY  2015  . EXCISION OF BREAST BIOPSY Left   . FOOT SURGERY Bilateral    5 screws   . TUBAL LIGATION    . UPPER GI ENDOSCOPY     Gastritis    Family History    Problem Relation Age of Onset  . Alzheimer's disease Mother   . Hypertension Mother   . Hyperlipidemia Mother   . COPD Mother   . Stroke Father   . Hypertension Father   . Heart disease Maternal Grandmother   . Parkinson's disease Paternal Grandfather   . Colon cancer Neg Hx   . Esophageal cancer Neg Hx     Allergies  Allergen Reactions  . Codeine Nausea And Vomiting    Other reaction(s): vomiting   . Peanut-Containing Drug Products     Headaches  . Sulfa Antibiotics Rash    Other reaction(s): rash    Current Outpatient Medications on File Prior to Visit  Medication Sig Dispense Refill  . amitriptyline (ELAVIL) 10 MG tablet Take 0.5-1 tablets by mouth daily as needed.  1  . cholecalciferol (VITAMIN D) 1000 units tablet Take 3,000 Units by mouth daily.    . cholestyramine (QUESTRAN) 4 GM/DOSE powder Take 1 packet (4 g total) by mouth 2 (two) times daily with a meal. 60 packet 3  . colestipol (COLESTID) 5 g granules Take 1 packet each morning for 7 days, then increase to 1 packet in the morning and evening if symptoms have not improved. 500 g 2  . cyanocobalamin 1000 MCG tablet Take 1,000 mcg by mouth daily.    . diclofenac sodium (VOLTAREN) 1 % GEL Apply 2 g topically 3 (three) times daily as needed. For pain. 100 g 0  . gabapentin (NEURONTIN) 100 MG capsule TAKE 2 CAPSULES BY MOUTH AT BEDTIME 180 capsule 3  . lansoprazole (PREVACID) 30 MG capsule Take 1 capsule (30 mg total) by mouth 2 (two) times daily before a meal. 180 capsule 3  . levothyroxine (SYNTHROID) 88 MCG tablet TAKE 1 TAB EVERY MORNING ON AN EMPTY STOMACH WITH WATER ONLY. NO FOOD OR OTHER MEDS FOR 30 MIN 90 tablet 1  . SUMAtriptan (IMITREX) 50 MG tablet TAKE 1 TABLET BY MOUTH AT MIGRAINE ONSET, MAY REPEAT WITH SECOND TABLET IN 2 HOURS IF MIGRAINE PERSISTS. 10 tablet 0  . [DISCONTINUED] colestipol (COLESTID) 1 g tablet Take 2 tablets (2 g total) by mouth 2 (two) times daily. (Patient not taking: Reported on  11/04/2018) 120 tablet 3   No current facility-administered medications on file prior to visit.    BP (!) 144/94   Pulse 68   Temp 97.6 F (36.4 C) (Temporal)   Ht 5\' 7"  (1.702 m)   Wt 178 lb 4 oz (80.9 kg)   SpO2 98%   BMI 27.92 kg/m    Objective:   Physical Exam  Constitutional: She appears well-nourished.  Cardiovascular: Normal rate  and regular rhythm.  Respiratory: Effort normal and breath sounds normal.  Musculoskeletal:     Cervical back: Neck supple.  Skin: Skin is warm and dry.  Psychiatric: She has a normal mood and affect.           Assessment & Plan:

## 2019-04-18 NOTE — Assessment & Plan Note (Addendum)
Above goal in the office today, also a non documented reading at her orthopedics office. Home readings above goal.  She is very motivated to work on lifestyle changes. Will also recheck TSH given history of over treatment with levothyroxine. BMP and lipids pending as well.  She will work on diet/exercise, monitor readings, we will plan to see her in 2 months for follow up of blood pressure readings. She will communicate sooner if BP >150/90.  Asymptomatic.

## 2019-04-18 NOTE — Patient Instructions (Addendum)
Stop by the lab prior to leaving today. I will notify you of your results once received.   Continue to work on [employer]. Continue to work on physical activity.  Your blood pressure should be <140 on top and <90 on bottom. Please call me if you see readings consistently at or >150 on top and >90 on bottom.  Please schedule a follow up appointment in 2 months for blood pressure check.  It was a pleasure to see you today!   DASH Eating Plan DASH stands for "Dietary Approaches to Stop Hypertension." The DASH eating plan is a healthy eating plan that has been shown to reduce high blood pressure (hypertension). It may also reduce your risk for type 2 diabetes, heart disease, and stroke. The DASH eating plan may also help with weight loss. What are tips for following this plan?  General guidelines  Avoid eating more than 2,300 mg (milligrams) of salt (sodium) a day. If you have hypertension, you may need to reduce your sodium intake to 1,500 mg a day.  Limit alcohol intake to no more than 1 drink a day for nonpregnant women and 2 drinks a day for men. One drink equals 12 oz of beer, 5 oz of wine, or 1 oz of hard liquor.  Work with your health care provider to maintain a healthy body weight or to lose weight. Ask what an ideal weight is for you.  Get at least 30 minutes of exercise that causes your heart to beat faster (aerobic exercise) most days of the week. Activities may include walking, swimming, or biking.  Work with your health care provider or diet and nutrition specialist (dietitian) to adjust your eating plan to your individual calorie needs. Reading food labels   Check food labels for the amount of sodium per serving. Choose foods with less than 5 percent of the Daily Value of sodium. Generally, foods with less than 300 mg of sodium per serving fit into this eating plan.  To find whole grains, look for the word "whole" as the first word in the ingredient list. Shopping  Buy  products labeled as "low-sodium" or "no salt added."  Buy fresh foods. Avoid canned foods and premade or frozen meals. Cooking  Avoid adding salt when cooking. Use salt-free seasonings or herbs instead of table salt or sea salt. Check with your health care provider or pharmacist before using salt substitutes.  Do not fry foods. Cook foods using healthy methods such as baking, boiling, grilling, and broiling instead.  Cook with heart-healthy oils, such as olive, canola, soybean, or sunflower oil. Meal planning  Eat a balanced diet that includes: ? 5 or more servings of fruits and vegetables each day. At each meal, try to fill half of your plate with fruits and vegetables. ? Up to 6-8 servings of whole grains each day. ? Less than 6 oz of lean meat, poultry, or fish each day. A 3-oz serving of meat is about the same size as a deck of cards. One egg equals 1 oz. ? 2 servings of low-fat dairy each day. ? A serving of nuts, seeds, or beans 5 times each week. ? Heart-healthy fats. Healthy fats called Omega-3 fatty acids are found in foods such as flaxseeds and coldwater fish, like sardines, salmon, and mackerel.  Limit how much you eat of the following: ? Canned or prepackaged foods. ? Food that is high in trans fat, such as fried foods. ? Food that is high in saturated fat, such  as fatty meat. ? Sweets, desserts, sugary drinks, and other foods with added sugar. ? Full-fat dairy products.  Do not salt foods before eating.  Try to eat at least 2 vegetarian meals each week.  Eat more home-cooked food and less restaurant, buffet, and fast food.  When eating at a restaurant, ask that your food be prepared with less salt or no salt, if possible. What foods are recommended? The items listed may not be a complete list. Talk with your dietitian about what dietary choices are best for you. Grains Whole-grain or whole-wheat bread. Whole-grain or whole-wheat pasta. Brown rice. [place].  Bulgur. Whole-grain and low-sodium cereals. Pita bread. Low-fat, low-sodium crackers. Whole-wheat flour tortillas. Vegetables Fresh or frozen vegetables (raw, steamed, roasted, or grilled). Low-sodium or reduced-sodium tomato and vegetable juice. Low-sodium or reduced-sodium tomato sauce and tomato paste. Low-sodium or reduced-sodium canned vegetables. Fruits All fresh, dried, or frozen fruit. Canned fruit in natural juice (without added sugar). Meat and other protein foods Skinless chicken or [place]. Ground chicken or [place]. Pork with fat trimmed off. Fish and seafood. Egg whites. Dried beans, peas, or lentils. Unsalted nuts, nut butters, and seeds. Unsalted canned beans. Lean cuts of beef with fat trimmed off. Low-sodium, lean deli meat. Dairy Low-fat (1%) or fat-free (skim) milk. Fat-free, low-fat, or reduced-fat cheeses. Nonfat, low-sodium ricotta or cottage cheese. Low-fat or nonfat yogurt. Low-fat, low-sodium cheese. Fats and oils Soft margarine without trans fats. Vegetable oil. Low-fat, reduced-fat, or light mayonnaise and salad dressings (reduced-sodium). Canola, safflower, olive, soybean, and sunflower oils. Avocado. Seasoning and other foods Herbs. Spices. Seasoning mixes without salt. Unsalted popcorn and pretzels. Fat-free sweets. What foods are not recommended? The items listed may not be a complete list. Talk with your dietitian about what dietary choices are best for you. Grains Baked goods made with fat, such as croissants, muffins, or some breads. Dry pasta or rice meal packs. Vegetables Creamed or fried vegetables. Vegetables in a cheese sauce. Regular canned vegetables (not low-sodium or reduced-sodium). Regular canned tomato sauce and paste (not low-sodium or reduced-sodium). Regular tomato and vegetable juice (not low-sodium or reduced-sodium). Angie Fava. Olives. Fruits Canned fruit in a light or heavy syrup. Fried fruit. Fruit in cream or butter sauce. Meat and other  protein foods Fatty cuts of meat. Ribs. Fried meat. Berniece Salines. Sausage. Bologna and other processed lunch meats. Salami. Fatback. Hotdogs. Bratwurst. Salted nuts and seeds. Canned beans with added salt. Canned or smoked fish. Whole eggs or egg yolks. Chicken or [place] with skin. Dairy Whole or 2% milk, cream, and half-and-half. Whole or full-fat cream cheese. Whole-fat or sweetened yogurt. Full-fat cheese. Nondairy creamers. Whipped toppings. Processed cheese and cheese spreads. Fats and oils Butter. Stick margarine. Lard. Shortening. Ghee. Bacon fat. Tropical oils, such as coconut, palm kernel, or palm oil. Seasoning and other foods Salted popcorn and pretzels. Onion salt, garlic salt, seasoned salt, table salt, and sea salt. Worcestershire sauce. Tartar sauce. Barbecue sauce. Teriyaki sauce. Soy sauce, including reduced-sodium. Steak sauce. Canned and packaged gravies. Fish sauce. Oyster sauce. Cocktail sauce. Horseradish that you find on the shelf. Ketchup. Mustard. Meat flavorings and tenderizers. Bouillon cubes. Hot sauce and Tabasco sauce. Premade or packaged marinades. Premade or packaged taco seasonings. Relishes. Regular salad dressings. Where to find more information:  National Heart, Lung, and [place]: https://wilson-eaton.com/  American Heart Association: www.heart.org Summary  The DASH eating plan is a healthy eating plan that has been shown to reduce high blood pressure (hypertension). It may also reduce your risk  for type 2 diabetes, heart disease, and stroke.  With the DASH eating plan, you should limit salt (sodium) intake to 2,300 mg a day. If you have hypertension, you may need to reduce your sodium intake to 1,500 mg a day.  When on the DASH eating plan, aim to eat more fresh fruits and vegetables, whole grains, lean proteins, low-fat dairy, and heart-healthy fats.  Work with your health care provider or diet and nutrition specialist (dietitian) to adjust your eating plan to your  individual calorie needs. This information is not intended to replace advice given to you by your health care provider. Make sure you discuss any questions you have with your health care provider. Document Revised: 02/12/2017 Document Reviewed: 02/24/2016 Elsevier Patient Education  2020 [employer].

## 2019-04-22 ENCOUNTER — Ambulatory Visit: Payer: Medicare Other | Attending: Internal Medicine

## 2019-04-22 DIAGNOSIS — Z23 Encounter for immunization: Secondary | ICD-10-CM

## 2019-04-22 NOTE — Progress Notes (Signed)
   Covid-19 Vaccination Clinic  Name:  MITALI NILSSON    MRN: [id number] DOB: 05/13/1950  04/22/2019  Ms. Kolenovic was observed post Covid-19 immunization for 15 minutes without incidence. She was provided with Vaccine Information Sheet and instruction to access the V-Safe system.   Ms. Eichenberg was instructed to call 911 with any severe reactions post vaccine: Marland Kitchen Difficulty breathing  . Swelling of your face and throat  . A fast heartbeat  . A bad rash all over your body  . Dizziness and weakness    Immunizations Administered    Name Date Dose VIS Date Route   Pfizer COVID-19 Vaccine 04/22/2019 10:44 AM 0.3 mL 02/24/2019 Intramuscular   Manufacturer: [place]   Lot: [id number]   [place]: [id number]

## 2019-05-05 ENCOUNTER — Other Ambulatory Visit: Payer: Self-pay | Admitting: Gastroenterology

## 2019-05-05 DIAGNOSIS — K219 Gastro-esophageal reflux disease without esophagitis: Secondary | ICD-10-CM

## 2019-05-07 ENCOUNTER — Ambulatory Visit: Payer: Medicare Other

## 2019-05-16 ENCOUNTER — Ambulatory Visit: Payer: Medicare Other | Attending: Internal Medicine

## 2019-05-16 DIAGNOSIS — Z23 Encounter for immunization: Secondary | ICD-10-CM

## 2019-05-16 NOTE — Progress Notes (Signed)
   Covid-19 Vaccination Clinic  Name:  Christie Wells    MRN: [id number] DOB: 12-11-1950  05/16/2019  Christie Wells was observed post Covid-19 immunization for 15 minutes without incident. She was provided with Vaccine Information Sheet and instruction to access the V-Safe system.   Christie Wells was instructed to call 911 with any severe reactions post vaccine: Marland Kitchen Difficulty breathing  . Swelling of face and throat  . A fast heartbeat  . A bad rash all over body  . Dizziness and weakness   Immunizations Administered    Name Date Dose VIS Date Route   Pfizer COVID-19 Vaccine 05/16/2019  9:58 AM 0.3 mL 02/24/2019 Intramuscular   Manufacturer: [place]   Lot: [id number]   [place]: [id number]

## 2019-05-23 ENCOUNTER — Other Ambulatory Visit: Payer: Self-pay | Admitting: Primary Care

## 2019-05-23 DIAGNOSIS — G43101 Migraine with aura, not intractable, with status migrainosus: Secondary | ICD-10-CM

## 2019-06-20 ENCOUNTER — Encounter: Payer: Self-pay | Admitting: Primary Care

## 2019-06-20 ENCOUNTER — Ambulatory Visit (INDEPENDENT_AMBULATORY_CARE_PROVIDER_SITE_OTHER): Payer: Medicare Other | Admitting: Primary Care

## 2019-06-20 ENCOUNTER — Other Ambulatory Visit: Payer: Self-pay

## 2019-06-20 DIAGNOSIS — E785 Hyperlipidemia, unspecified: Secondary | ICD-10-CM | POA: Diagnosis not present

## 2019-06-20 DIAGNOSIS — R030 Elevated blood-pressure reading, without diagnosis of hypertension: Secondary | ICD-10-CM | POA: Diagnosis not present

## 2019-06-20 NOTE — Assessment & Plan Note (Signed)
LDL of 152 on recent labs, ASCVD risk score of 8%. Commended her on lifestyle changes thus far.  Repeat lipids around August 2021.

## 2019-06-20 NOTE — Patient Instructions (Signed)
Continue exercising. You should be getting 150 minutes of moderate intensity exercise weekly.  Continue to work on a healthy diet. Ensure you are consuming 64 ounces of water daily.  Schedule a lab appointment to repeat your cholesterol around August.   It was a pleasure to see you today!

## 2019-06-20 NOTE — Progress Notes (Signed)
Subjective:    Patient ID: Christie Wells, female    DOB: [birth year], 69 y.o.   MRN: [id number]  HPI  This visit occurred during the SARS-CoV-2 public health emergency.  Safety protocols were in place, including screening questions prior to the visit, additional usage of staff PPE, and extensive cleaning of exam room while observing appropriate contact time as indicated for disinfecting solutions.   [name] is a 69 year old female with a history of migraines, hypothyroidism, elevated blood pressure reading who presents today for follow up of blood pressure.  She was last evaluated in early February 2021, BP was noted to be above goal in our office and also during an orthopedist appointment. She was very motivated to work on lifestyle changes so she is here today for follow up.  Since her last visit she's checking her BP at home which is running 120's-140's/80's-90's. Most of her readings are running in the high 130's/80's. She denies chest pain, dizziness, shortness of breath, headaches, palpitations.   She's significantly reduced her salt intake, is reading food labels, making healthier choices at restaurants.   Wt Readings from Last 3 Encounters:  06/20/19 178 lb 8 oz (81 kg)  04/18/19 178 lb 4 oz (80.9 kg)  01/11/19 161 lb 4 oz (73.1 kg)     BP Readings from Last 3 Encounters:  06/20/19 126/86  04/18/19 (!) 144/94  01/11/19 126/82     Review of Systems  Eyes: Negative for visual disturbance.  Respiratory: Negative for shortness of breath.   Neurological: Negative for dizziness and headaches.       Past Medical History:  Diagnosis Date  . Arthritis   . Chronic headaches   . Colitis 1984  . GERD (gastroesophageal reflux disease)   . Hypothyroidism   . Migraines   . UTI (urinary tract infection)      Social History   Socioeconomic History  . Marital status: Divorced    Spouse name: Not on file  . Number of children: 2  . Years of education: Not on file  .  Highest education level: Not on file  Occupational History  . Not on file  Tobacco Use  . Smoking status: Never Smoker  . Smokeless tobacco: Never Used  Substance and Sexual Activity  . Alcohol use: Yes    Comment: occ  . Drug use: No  . Sexual activity: Not on file  Other Topics Concern  . Not on file  Social History Narrative   Divorced.   2 children.   Moved from [place].   Once worked as an Journalist, newspaper.       Social Determinants of Health   Financial Resource Strain:   . Difficulty of Paying Living Expenses:   Food Insecurity:   . Worried About Charity fundraiser in the Last Year:   . Arboriculturist in the Last Year:   Transportation Needs:   . Film/video editor (Medical):   Marland Kitchen Lack of Transportation (Non-Medical):   Physical Activity:   . Days of Exercise per Week:   . Minutes of Exercise per Session:   Stress:   . Feeling of Stress :   Social Connections:   . Frequency of Communication with Friends and Family:   . Frequency of Social Gatherings with Friends and Family:   . Attends Religious Services:   . Active Member of Clubs or Organizations:   . Attends Archivist Meetings:   Marland Kitchen Marital Status:  Intimate Partner Violence:   . Fear of Current or Ex-Partner:   . Emotionally Abused:   Marland Kitchen Physically Abused:   . Sexually Abused:     Past Surgical History:  Procedure Laterality Date  . BUNIONECTOMY Bilateral   . COLONOSCOPY  2015  . EXCISION OF BREAST BIOPSY Left   . FOOT SURGERY Bilateral    5 screws   . TUBAL LIGATION    . UPPER GI ENDOSCOPY     Gastritis    Family History  Problem Relation Age of Onset  . Alzheimer's disease Mother   . Hypertension Mother   . Hyperlipidemia Mother   . COPD Mother   . Stroke Father   . Hypertension Father   . Heart disease Maternal Grandmother   . Parkinson's disease Paternal Grandfather   . Colon cancer Neg Hx   . Esophageal cancer Neg Hx     Allergies  Allergen Reactions  .  Codeine Nausea And Vomiting    Other reaction(s): vomiting   . Peanut-Containing Drug Products     Headaches  . Sulfa Antibiotics Rash    Other reaction(s): rash    Current Outpatient Medications on File Prior to Visit  Medication Sig Dispense Refill  . amitriptyline (ELAVIL) 10 MG tablet Take 0.5-1 tablets by mouth daily as needed.  1  . cholecalciferol (VITAMIN D) 1000 units tablet Take 3,000 Units by mouth daily.    . colestipol (COLESTID) 5 g granules Take 1 packet each morning for 7 days, then increase to 1 packet in the morning and evening if symptoms have not improved. 500 g 2  . cyanocobalamin 1000 MCG tablet Take 1,000 mcg by mouth daily.    . diclofenac sodium (VOLTAREN) 1 % GEL Apply 2 g topically 3 (three) times daily as needed. For pain. 100 g 0  . gabapentin (NEURONTIN) 100 MG capsule TAKE 2 CAPSULES BY MOUTH AT BEDTIME 180 capsule 3  . lansoprazole (PREVACID) 30 MG capsule TAKE 1 CAPSULE (30 MG TOTAL) BY MOUTH 2 (TWO) TIMES DAILY BEFORE A MEAL. 180 capsule 3  . levothyroxine (SYNTHROID) 88 MCG tablet TAKE 1 TAB EVERY MORNING ON AN EMPTY STOMACH WITH WATER ONLY. NO FOOD OR OTHER MEDS FOR 30 MIN 90 tablet 1  . SUMAtriptan (IMITREX) 50 MG tablet TAKE 1 TABLET BY MOUTH AT MIGRAINE ONSET, MAY REPEAT WITH SECOND TABLET IN 2 HOURS IF MIGRAINE PERSISTS. 10 tablet 0  . cholestyramine (QUESTRAN) 4 GM/DOSE powder Take 1 packet (4 g total) by mouth 2 (two) times daily with a meal. 60 packet 3  . [DISCONTINUED] colestipol (COLESTID) 1 g tablet Take 2 tablets (2 g total) by mouth 2 (two) times daily. (Patient not taking: Reported on 11/04/2018) 120 tablet 3   No current facility-administered medications on file prior to visit.    BP 126/86   Pulse 72   Temp (!) 96.7 F (35.9 C) (Temporal)   Ht 5\' 7"  (1.702 m)   Wt 178 lb 8 oz (81 kg)   SpO2 98%   BMI 27.96 kg/m    Objective:   Physical Exam  Constitutional: She appears well-nourished.  Cardiovascular: Normal rate and regular  rhythm.  Respiratory: Effort normal and breath sounds normal.  Musculoskeletal:     Cervical back: Neck supple.  Skin: Skin is warm and dry.           Assessment & Plan:

## 2019-06-20 NOTE — Assessment & Plan Note (Signed)
Much improved on today's exam, commended her on lifestyle changes.  Asymptomatic. Continue to monitor.

## 2019-06-22 ENCOUNTER — Other Ambulatory Visit: Payer: Self-pay | Admitting: Primary Care

## 2019-06-22 DIAGNOSIS — E039 Hypothyroidism, unspecified: Secondary | ICD-10-CM

## 2019-09-07 ENCOUNTER — Ambulatory Visit: Payer: Medicare Other | Admitting: Primary Care

## 2019-09-07 ENCOUNTER — Encounter: Payer: Self-pay | Admitting: Primary Care

## 2019-09-07 ENCOUNTER — Ambulatory Visit (INDEPENDENT_AMBULATORY_CARE_PROVIDER_SITE_OTHER): Payer: Medicare Other | Admitting: Primary Care

## 2019-09-07 ENCOUNTER — Other Ambulatory Visit: Payer: Self-pay

## 2019-09-07 VITALS — BP 126/84 | HR 78 | Temp 95.7°F | Ht 67.0 in | Wt 180.0 lb

## 2019-09-07 DIAGNOSIS — D229 Melanocytic nevi, unspecified: Secondary | ICD-10-CM

## 2019-09-07 DIAGNOSIS — M778 Other enthesopathies, not elsewhere classified: Secondary | ICD-10-CM

## 2019-09-07 DIAGNOSIS — H938X1 Other specified disorders of right ear: Secondary | ICD-10-CM | POA: Insufficient documentation

## 2019-09-07 NOTE — Patient Instructions (Addendum)
Start Advil 600 mg twice daily with food for pain and inflammation to the hand. Take this for about one week.  No golf for one week, rest your hand when possible, wear the brace for support during the day. Take off at night.  You will be contacted regarding your referral to dermatology.  Please let us know if you have not been contacted within two weeks.   It was a pleasure to see you today!   Tendinitis  Tendinitis is inflammation of a tendon. A tendon is a strong cord of tissue that connects muscle to bone. Tendinitis can affect any tendon, but it most commonly affects the:  Shoulder tendon (rotator cuff).  Ankle tendon (Achilles tendon).  Elbow tendon (triceps tendon).  Tendons in the wrist. What are the causes? This condition may be caused by:  Overusing a tendon or muscle. This is common.  Age-related wear and tear.  Injury.  Inflammatory conditions, such as arthritis.  Certain medicines. What increases the risk? You are more likely to develop this condition if you do activities that involve the same movements over and over again (repetitive motions). What are the signs or symptoms? Symptoms of this condition may include:  Pain.  Tenderness.  Mild swelling.  Decreased range of motion. How is this diagnosed? This condition is diagnosed with a physical exam. You may also have tests, such as:  Ultrasound. This uses sound waves to make an image of the inside of your body in the affected area.  MRI. How is this treated? This condition may be treated by resting, icing, applying pressure (compression), and raising (elevating) the affected area above the level of your heart. This is known as RICE therapy. Treatment may also include:  Medicines to help reduce inflammation or to help reduce pain.  Exercises or physical therapy to strengthen and stretch the tendon.  A brace or splint.  Surgery. This is rarely needed. Follow these instructions at home: If you  have a splint or brace:  Wear the splint or brace as told by your health care provider. Remove it only as told by your health care provider.  Loosen the splint or brace if your fingers or toes tingle, become numb, or turn cold and blue.  Keep the splint or brace clean.  If the splint or brace is not waterproof: ? Do not let it get wet. ? Cover it with a watertight covering when you take a bath or shower. Managing pain, stiffness, and swelling  If directed, put ice on the affected area. ? If you have a removable splint or brace, remove it as told by your health care provider. ? Put ice in a plastic bag. ? Place a towel between your skin and the bag. ? Leave the ice on for 20 minutes, 2-3 times a day.  Move the fingers or toes of the affected limb often, if this applies. This can help to prevent stiffness and lessen swelling.  If directed, raise (elevate) the affected area above the level of your heart while you are sitting or lying down.  If directed, apply heat to the affected area before you exercise. Use the heat source that your health care provider recommends, such as a moist heat pack or a heating pad.     ? Place a towel between your skin and the heat source. ? Leave the heat on for 20-30 minutes. ? Remove the heat if your skin turns bright red. This is especially important if you are unable to  feel pain, heat, or cold. You may have a greater risk of getting burned. Driving  Do not drive or use heavy machinery while taking prescription pain medicine.  Ask your health care provider when it is safe to drive if you have a splint or brace on any part of your arm or leg. Activity  Rest the affected area as told by your health care provider.  Return to your normal activities as told by your health care provider. Ask your health care provider what activities are safe for you.  Avoid using the affected area while you are experiencing symptoms of tendinitis.  Do exercises as  told by your health care provider. General instructions  If you have a splint, do not put pressure on any part of the splint until it is fully hardened. This may take several hours.  Wear an elastic bandage or compression wrap only as told by your health care provider.  Take over-the-counter and prescription medicines only as told by your health care provider.  Keep all follow-up visits as told by your health care provider. This is important. Contact a health care provider if:  Your symptoms do not improve.  You develop new, unexplained problems, such as numbness in your hands. Summary  Tendinitis is inflammation of a tendon.  You are more likely to develop this condition if you do activities that involve the same movements over and over again.  This condition may be treated by resting, icing, applying pressure (compression), and elevating the area above the level of your heart. This is known as RICE therapy.  Avoid using the affected area while you are experiencing symptoms of tendinitis. This information is not intended to replace advice given to you by your health care provider. Make sure you discuss any questions you have with your health care provider. Document Revised: 09/07/2017 Document Reviewed: 07/21/2017 Elsevier Patient Education  [place], Adult The ears produce a substance called earwax that helps keep bacteria out of the ear and protects the skin in the ear canal. Occasionally, earwax can build up in the ear and cause discomfort or hearing loss. What increases the risk? This condition is more likely to develop in people who:  Are female.  Are elderly.  Naturally produce more earwax.  Clean their ears often with cotton swabs.  Use earplugs often.  Use in-ear headphones often.  Wear hearing aids.  Have narrow ear canals.  Have earwax that is overly thick or sticky.  Have eczema.  Are dehydrated.  Have excess hair in the  ear canal. What are the signs or symptoms? Symptoms of this condition include:  Reduced or muffled hearing.  A feeling of fullness in the ear or feeling that the ear is plugged.  Fluid coming from the ear.  Ear pain.  Ear itch.  Ringing in the ear.  Coughing.  An obvious piece of earwax that can be seen inside the ear canal. How is this diagnosed? This condition may be diagnosed based on:  Your symptoms.  Your medical history.  An ear exam. During the exam, your health care provider will look into your ear with an instrument called an otoscope. You may have tests, including a hearing test. How is this treated? This condition may be treated by:  Using ear drops to soften the earwax.  Having the earwax removed by a health care provider. The health care provider may: ? Flush the ear with water. ? Use an instrument that has  a loop on the end (curette). ? Use a suction device.  Surgery to remove the wax buildup. This may be done in severe cases. Follow these instructions at home:   Take over-the-counter and prescription medicines only as told by your health care provider.  Do not put any objects, including cotton swabs, into your ear. You can clean the opening of your ear canal with a washcloth or facial tissue.  Follow instructions from your health care provider about cleaning your ears. Do not over-clean your ears.  Drink enough fluid to keep your urine clear or pale yellow. This will help to thin the earwax.  Keep all follow-up visits as told by your health care provider. If earwax builds up in your ears often or if you use hearing aids, consider seeing your health care provider for routine, preventive ear cleanings. Ask your health care provider how often you should schedule your cleanings.  If you have hearing aids, clean them according to instructions from the manufacturer and your health care provider. Contact a health care provider if:  You have ear  pain.  You develop a fever.  You have blood, pus, or other fluid coming from your ear.  You have hearing loss.  You have ringing in your ears that does not go away.  Your symptoms do not improve with treatment.  You feel like the room is spinning (vertigo). Summary  Earwax can build up in the ear and cause discomfort or hearing loss.  The most common symptoms of this condition include reduced or muffled hearing and a feeling of fullness in the ear or feeling that the ear is plugged.  This condition may be diagnosed based on your symptoms, your medical history, and an ear exam.  This condition may be treated by using ear drops to soften the earwax or by having the earwax removed by a health care provider.  Do not put any objects, including cotton swabs, into your ear. You can clean the opening of your ear canal with a washcloth or facial tissue. This information is not intended to replace advice given to you by your health care provider. Make sure you discuss any questions you have with your health care provider. Document Revised: 02/12/2017 Document Reviewed: 05/13/2016 Elsevier Patient Education  2020 [employer].

## 2019-09-07 NOTE — Assessment & Plan Note (Signed)
Pain and inflammation over tendon site, no swelling today. Overall benign exam today, except for tenderness over tendon site.  Given that Christie Wells did have some relive with temporary NSAID use, we will have her start a regimen of Ibuprofen 600 mg BID x 1 week. Continue to wear a brace for support, no golf for one week.  Christie Wells will update.

## 2019-09-07 NOTE — Assessment & Plan Note (Signed)
Acute for about 2 months, swims often. Exam today without evidence of acute otitis externa. She does have a moderate cerumen impaction to right canal.   She agrees to irrigation. Right canal irrigated, she tolerated well. TM and canal unremarkable post irrigation.  Discussed OTC treatment for prevention.

## 2019-09-07 NOTE — Progress Notes (Signed)
Subjective:    Patient ID: Christie Wells, female    DOB: 01/12/1951, 69 y.o.   MRN: [id number]  HPI  This visit occurred during the SARS-CoV-2 public health emergency.  Safety protocols were in place, including screening questions prior to the visit, additional usage of staff PPE, and extensive cleaning of exam room while observing appropriate contact time as indicated for disinfecting solutions.   Christie Wells is a 69 year old female with a history of migraines, hypothyroidism, chronic back pain, dizziness who presents today with several issues. She would like a referral to dermatology for removal of two nevi.   1) Ear Fullness: Acute for a few months and located to the right ear. She's tried OTC ear wax removal drops and used a brush without improvement. She does swim at the [facility] twice weekly, has not swam in two weeks. She denies pain, and dizziness, but she has noticed a decreased ability to hear.   2) Hand Swelling: Ten days ago she noticed right palmer and hand swelling with pain between the first and second digits on the dorsal side. The swelling lasted for three evenings and has dissipated, but her pain persists. She denies overuse, heavy lifting, skin color changes, numbness/tingling. She does golf regularly, she last played yesterday, doesn't typically have pain when she plays golf, but does have pain in the evenings.   Overall her pain is about the same. She did purchase a thumb and wrist brace from the store, has worn it on occasion without much improvement. She's taken a few doses of Advil with some improvement.   BP Readings from Last 3 Encounters:  09/07/19 126/84  06/20/19 126/86  04/18/19 (!) 144/94     Review of Systems  Constitutional: Negative for fever.  HENT: Negative for ear pain and postnasal drip.        Ear fullness, right  Musculoskeletal: Positive for arthralgias. Negative for joint swelling.       Acute right hand pain  Skin:       Multiple nevi         Past Medical History:  Diagnosis Date  . Arthritis   . Chronic headaches   . Colitis 1984  . GERD (gastroesophageal reflux disease)   . Hypothyroidism   . Migraines   . UTI (urinary tract infection)      Social History   Socioeconomic History  . Marital status: Divorced    Spouse name: Not on file  . Number of children: 2  . Years of education: Not on file  . Highest education level: Not on file  Occupational History  . Not on file  Tobacco Use  . Smoking status: Never Smoker  . Smokeless tobacco: Never Used  Vaping Use  . Vaping Use: Never used  Substance and Sexual Activity  . Alcohol use: Yes    Comment: occ  . Drug use: No  . Sexual activity: Not on file  Other Topics Concern  . Not on file  Social History Narrative   Divorced.   2 children.   Moved from [place].   Once worked as an Journalist, newspaper.       Social Determinants of Health   Financial Resource Strain:   . Difficulty of Paying Living Expenses:   Food Insecurity:   . Worried About Charity fundraiser in the Last Year:   . Arboriculturist in the Last Year:   Transportation Needs:   . Film/video editor (Medical):   Marland Kitchen  Lack of Transportation (Non-Medical):   Physical Activity:   . Days of Exercise per Week:   . Minutes of Exercise per Session:   Stress:   . Feeling of Stress :   Social Connections:   . Frequency of Communication with Friends and Family:   . Frequency of Social Gatherings with Friends and Family:   . Attends Religious Services:   . Active Member of Clubs or Organizations:   . Attends Archivist Meetings:   Marland Kitchen Marital Status:   Intimate Partner Violence:   . Fear of Current or Ex-Partner:   . Emotionally Abused:   Marland Kitchen Physically Abused:   . Sexually Abused:     Past Surgical History:  Procedure Laterality Date  . BUNIONECTOMY Bilateral   . COLONOSCOPY  2015  . EXCISION OF BREAST BIOPSY Left   . FOOT SURGERY Bilateral    5 screws   . TUBAL  LIGATION    . UPPER GI ENDOSCOPY     Gastritis    Family History  Problem Relation Age of Onset  . Alzheimer's disease Mother   . Hypertension Mother   . Hyperlipidemia Mother   . COPD Mother   . Stroke Father   . Hypertension Father   . Heart disease Maternal Grandmother   . Parkinson's disease Paternal Grandfather   . Colon cancer Neg Hx   . Esophageal cancer Neg Hx     Allergies  Allergen Reactions  . Codeine Nausea And Vomiting    Other reaction(s): vomiting   . Peanut-Containing Drug Products     Headaches  . Sulfa Antibiotics Rash    Other reaction(s): rash    Current Outpatient Medications on File Prior to Visit  Medication Sig Dispense Refill  . amitriptyline (ELAVIL) 10 MG tablet Take 0.5-1 tablets by mouth daily as needed.  1  . cholecalciferol (VITAMIN D) 1000 units tablet Take 3,000 Units by mouth daily.    . cyanocobalamin 1000 MCG tablet Take 1,000 mcg by mouth daily.    Marland Kitchen gabapentin (NEURONTIN) 100 MG capsule TAKE 2 CAPSULES BY MOUTH AT BEDTIME 180 capsule 3  . lansoprazole (PREVACID) 30 MG capsule TAKE 1 CAPSULE (30 MG TOTAL) BY MOUTH 2 (TWO) TIMES DAILY BEFORE A MEAL. 180 capsule 3  . levothyroxine (SYNTHROID) 88 MCG tablet TAKE 1 TAB EVERY MORNING ON AN EMPTY STOMACH WITH WATER ONLY. NO FOOD OR OTHER MEDS FOR 30 MIN 90 tablet 1  . SUMAtriptan (IMITREX) 50 MG tablet TAKE 1 TABLET BY MOUTH AT MIGRAINE ONSET, MAY REPEAT WITH SECOND TABLET IN 2 HOURS IF MIGRAINE PERSISTS. 10 tablet 0  . [DISCONTINUED] colestipol (COLESTID) 1 g tablet Take 2 tablets (2 g total) by mouth 2 (two) times daily. (Patient not taking: Reported on 11/04/2018) 120 tablet 3   No current facility-administered medications on file prior to visit.    BP 126/84   Pulse 78   Temp (!) 95.7 F (35.4 C) (Temporal)   Ht 5\' 7"  (1.702 m)   Wt 180 lb (81.6 kg)   SpO2 98%   BMI 28.19 kg/m    Objective:   Physical Exam  Musculoskeletal:     Right hand: Tenderness present. No swelling or  bony tenderness. Normal range of motion. Normal strength. Normal sensation. Normal pulse.     Comments: Negative Tinel's and Phalen's sign. Tender along tendon line.   Skin:  Small 2 mm rounded, benign appearing nevus to mid upper sternum at necklace line. Flat.   Small 3 mm  rounded, benign appearing nevus to right upper posterior/lateral thigh. Flat.           Assessment & Plan:

## 2019-09-07 NOTE — Assessment & Plan Note (Signed)
Requesting removal of two nevi which appear difficult to remove. Referral placed to dermatology.

## 2019-09-11 ENCOUNTER — Ambulatory Visit
Admission: RE | Admit: 2019-09-11 | Discharge: 2019-09-11 | Disposition: A | Discharge status: Home / Self Care | Payer: Medicare Other | Source: Ambulatory Visit | Attending: Primary Care | Admitting: Primary Care

## 2019-09-11 DIAGNOSIS — Z1231 Encounter for screening mammogram for malignant neoplasm of breast: Secondary | ICD-10-CM

## 2019-09-11 IMAGING — MG DIGITAL SCREENING BILAT W/ TOMO W/ CAD
8 series · 8 of 24 positions shown · non-contrast
Comparison: Previous exam(s).

CLINICAL DATA: Screening.

EXAM:
DIGITAL SCREENING BILATERAL MAMMOGRAM WITH TOMO AND CAD

[R CC synth-2D]
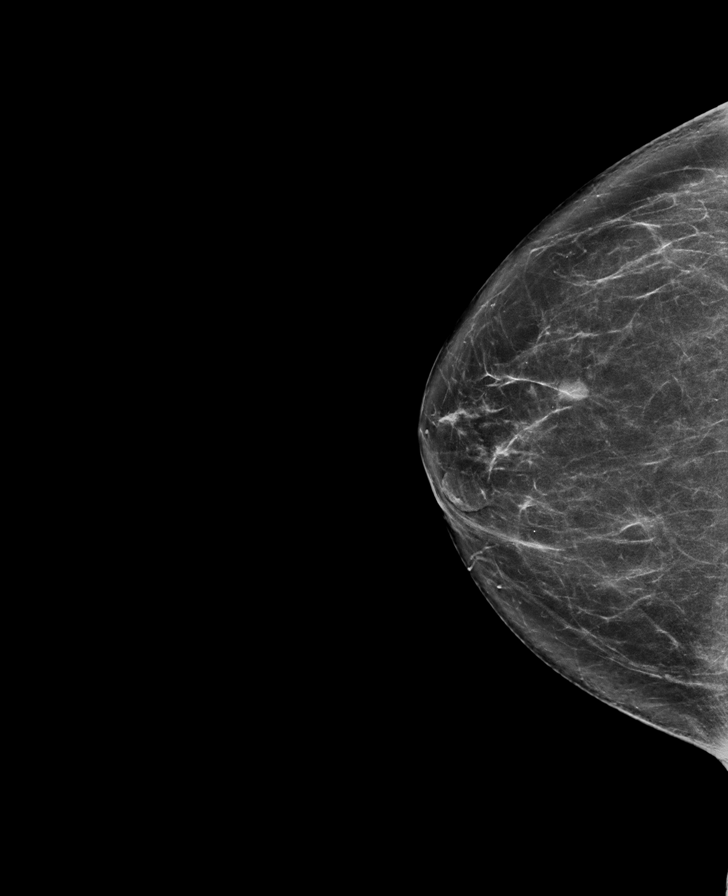

[R MLO synth-2D]
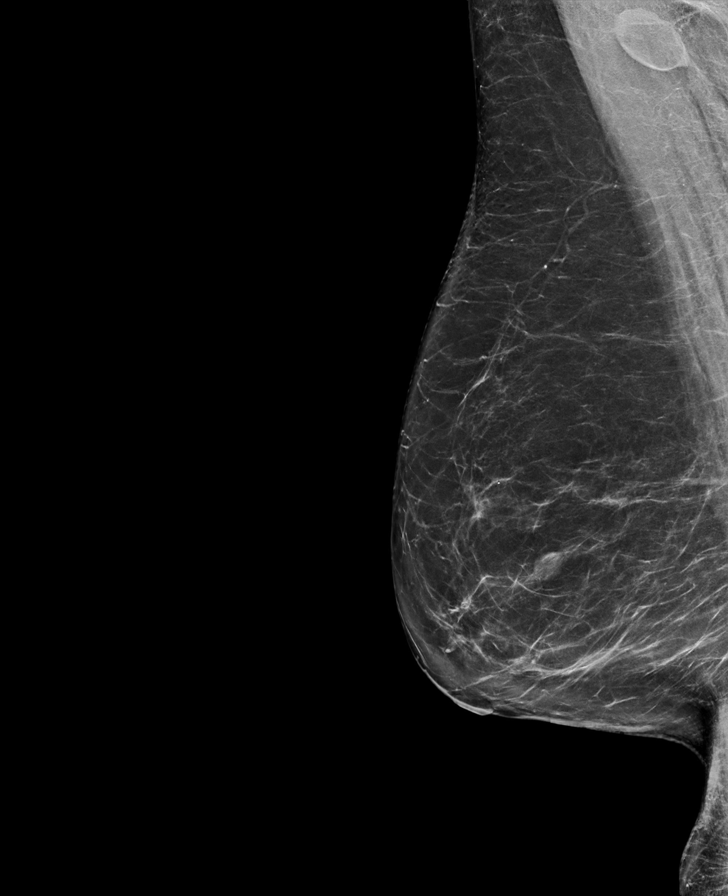

[L MLO synth-2D]
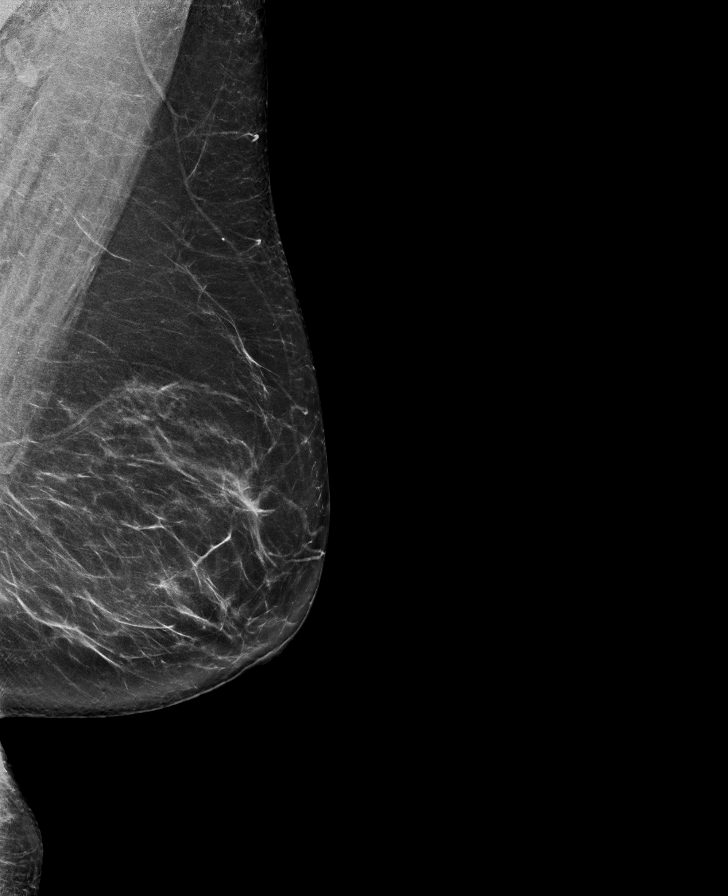

[L CC synth-2D]
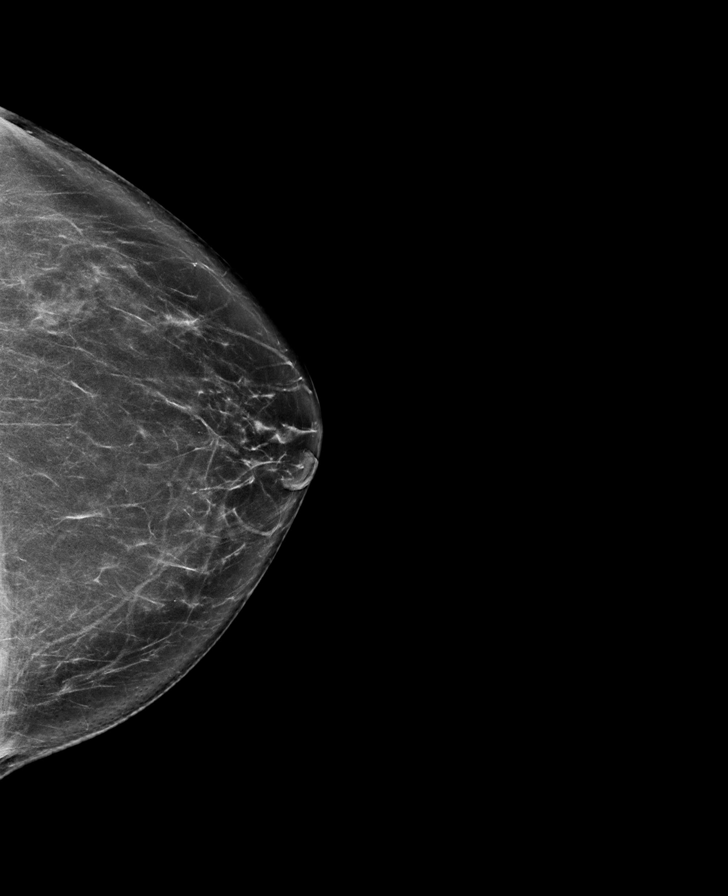

[R CC tomo · tomo slice 37/73.0]
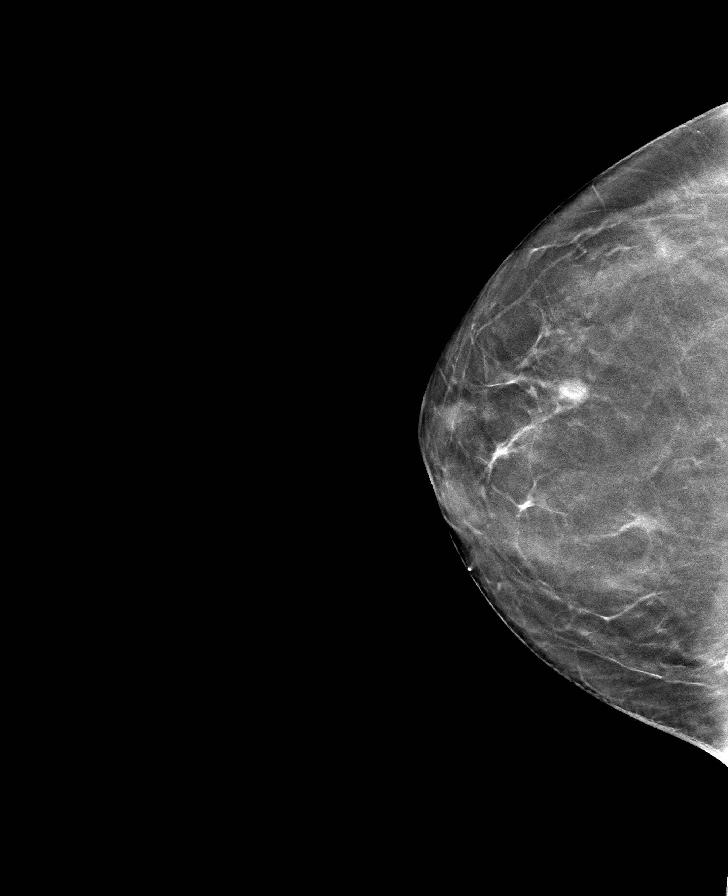

[R MLO tomo · tomo slice 45/90.0]
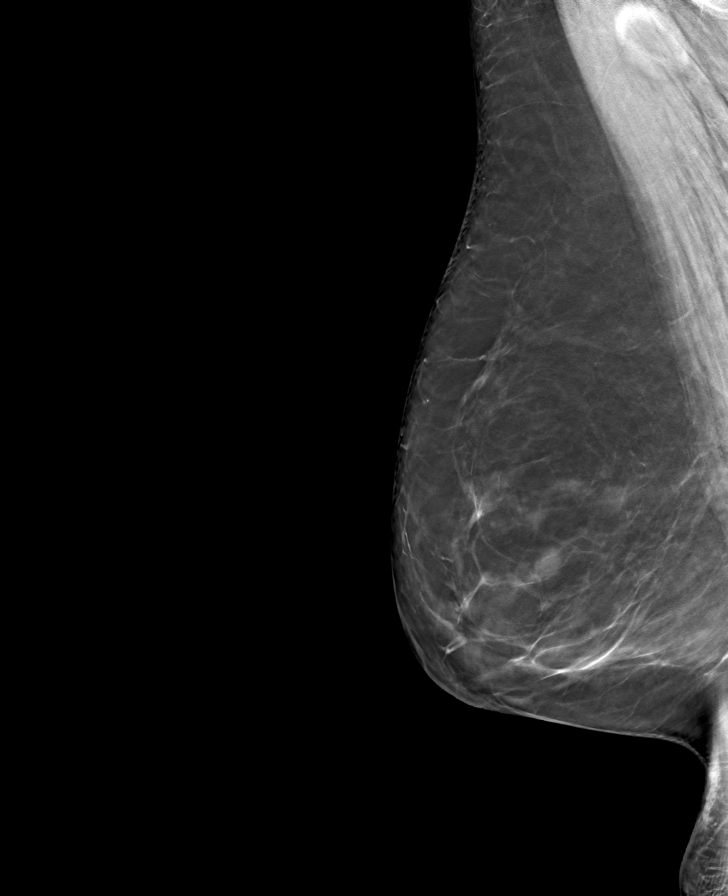

[L CC tomo · tomo slice 39/76.0]
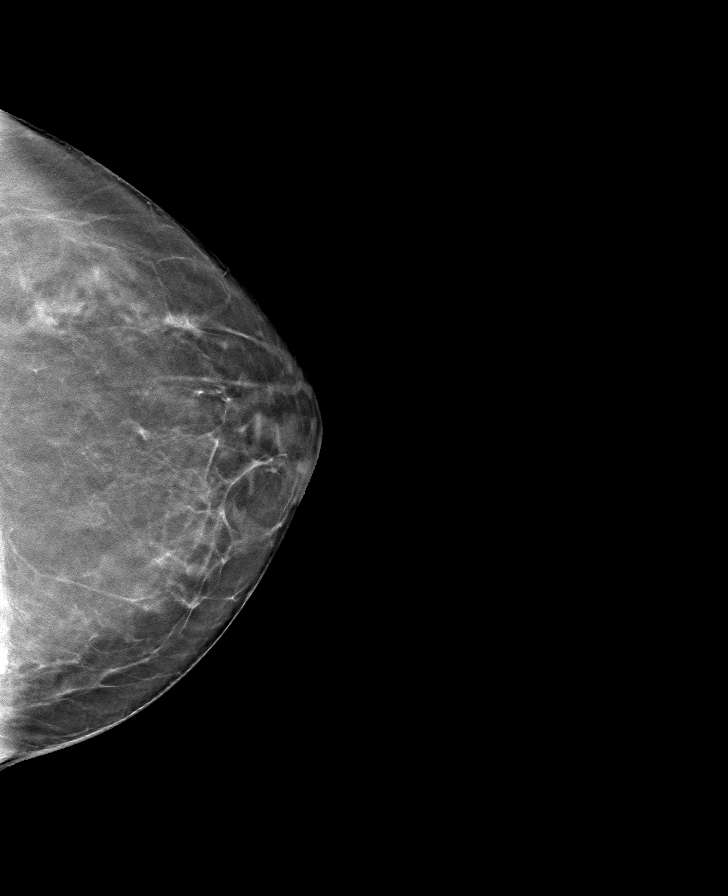

[L MLO tomo · tomo slice 39/78.0]
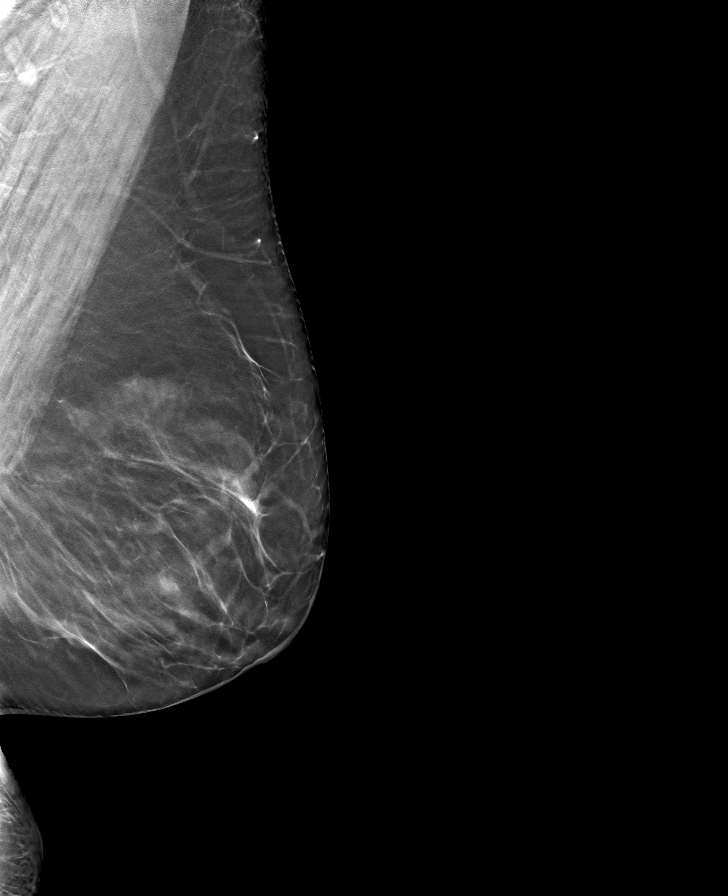

[8 of 24 positions shown; findings below may reference images not displayed]

ACR Breast Density Category b: There are scattered areas of
fibroglandular density.
FINDINGS: There are no findings suspicious for malignancy. Images were
processed with CAD.
IMPRESSION: No mammographic evidence of malignancy. A result letter of this
screening mammogram will be mailed directly to the patient.

RECOMMENDATION:
Screening mammogram in one year. (Code:[TQ])

BI-RADS CATEGORY  1: Negative.

## 2019-09-29 ENCOUNTER — Other Ambulatory Visit: Payer: Self-pay | Admitting: Primary Care

## 2019-09-29 DIAGNOSIS — E785 Hyperlipidemia, unspecified: Secondary | ICD-10-CM

## 2019-10-13 ENCOUNTER — Emergency Department: Payer: Medicare Other

## 2019-10-13 ENCOUNTER — Other Ambulatory Visit: Payer: Self-pay

## 2019-10-13 ENCOUNTER — Emergency Department
Admission: EM | Admit: 2019-10-13 | Discharge: 2019-10-13 | Disposition: A | Discharge status: Home / Self Care | Payer: Medicare Other | Attending: Emergency Medicine | Admitting: Emergency Medicine

## 2019-10-13 ENCOUNTER — Telehealth: Payer: Self-pay

## 2019-10-13 DIAGNOSIS — E039 Hypothyroidism, unspecified: Secondary | ICD-10-CM | POA: Diagnosis not present

## 2019-10-13 DIAGNOSIS — R002 Palpitations: Secondary | ICD-10-CM

## 2019-10-13 DIAGNOSIS — Z91010 Allergy to peanuts: Secondary | ICD-10-CM | POA: Insufficient documentation

## 2019-10-13 DIAGNOSIS — I4891 Unspecified atrial fibrillation: Secondary | ICD-10-CM

## 2019-10-13 LAB — COMPREHENSIVE METABOLIC PANEL
ALT: 12 U/L (ref 0–44)
AST: 17 U/L (ref 15–41)
Albumin: 4 g/dL (ref 3.5–5.0)
Alkaline Phosphatase: 103 U/L (ref 38–126)
Anion gap: 11 (ref 5–15)
BUN: 18 mg/dL (ref 8–23)
CO2: 25 mmol/L (ref 22–32)
Calcium: 9.2 mg/dL (ref 8.9–10.3)
Chloride: 105 mmol/L (ref 98–111)
Creatinine, Ser: 1.12 mg/dL — ABNORMAL HIGH (ref 0.44–1.00)
GFR calc Af Amer: 59 mL/min — ABNORMAL LOW (ref >60)
GFR calc non Af Amer: 51 mL/min — ABNORMAL LOW (ref >60)
Glucose, Bld: 131 mg/dL — ABNORMAL HIGH (ref 70–99)
Potassium: 3.8 mmol/L (ref 3.5–5.1)
Sodium: 141 mmol/L (ref 135–145)
Total Bilirubin: 1.2 mg/dL (ref 0.3–1.2)
Total Protein: 7.1 g/dL (ref 6.5–8.1)

## 2019-10-13 LAB — TROPONIN I (HIGH SENSITIVITY)
Troponin I (High Sensitivity): 7 ng/L (ref <18)
Troponin I (High Sensitivity): 8 ng/L (ref <18)

## 2019-10-13 LAB — CBC
HCT: 46.4 % — ABNORMAL HIGH (ref 36.0–46.0)
Hemoglobin: 16.6 g/dL — ABNORMAL HIGH (ref 12.0–15.0)
MCH: 31.7 pg (ref 26.0–34.0)
MCHC: 35.8 g/dL (ref 30.0–36.0)
MCV: 88.7 fL (ref 80.0–100.0)
Platelets: 216 10*3/uL (ref 150–400)
RBC: 5.23 MIL/uL — ABNORMAL HIGH (ref 3.87–5.11)
RDW: 12.8 % (ref 11.5–15.5)
WBC: 11.6 10*3/uL — ABNORMAL HIGH (ref 4.0–10.5)
nRBC: 0 % (ref 0.0–0.2)

## 2019-10-13 IMAGING — CR DG CHEST 2V
1 series · 2 of 2 positions shown · non-contrast
Comparison: None.

CLINICAL DATA: 68-year-old female with shortness of breath and
palpitation.

EXAM:
CHEST - 2 VIEW

[Series 1: dg chest 2 view · 0.14mm/px · 2 of 2 slices shown]
[im 1/2]
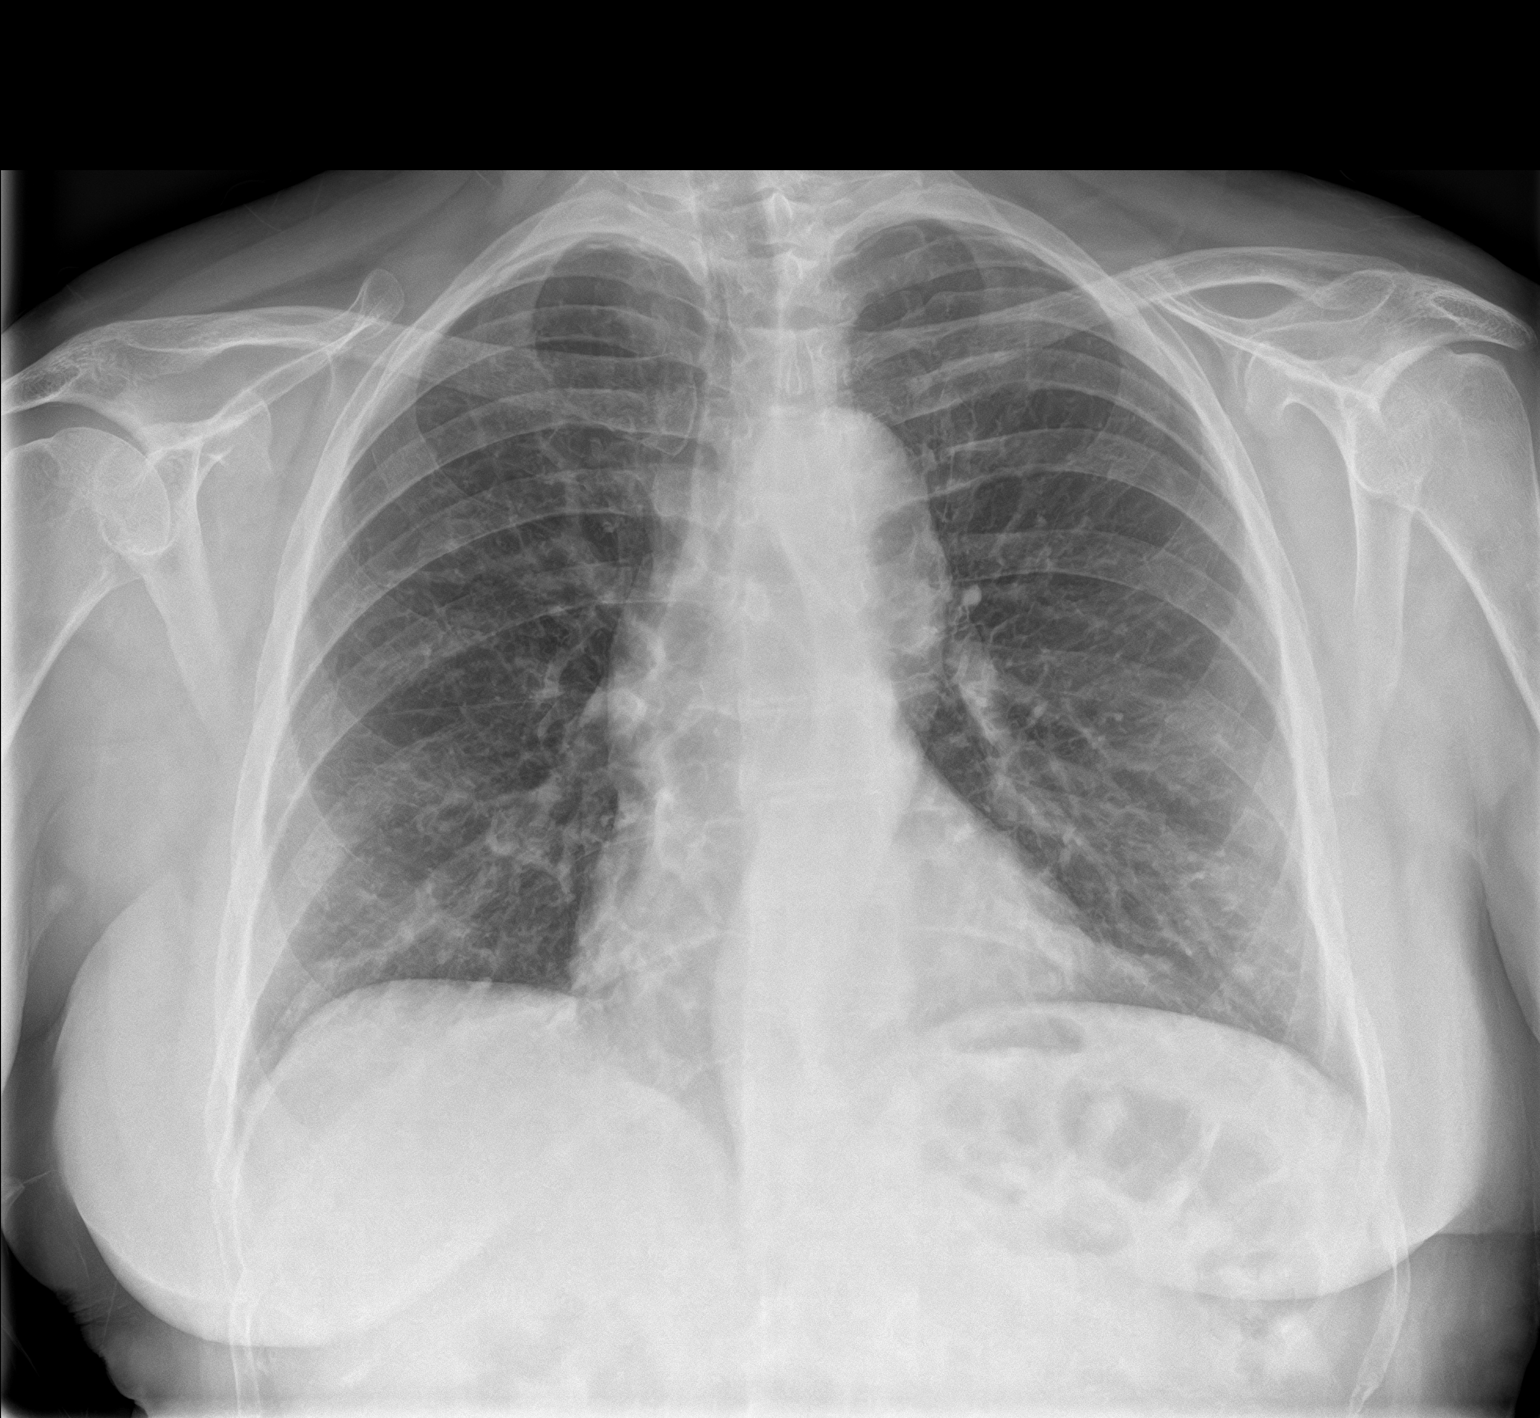
[im 2/2]
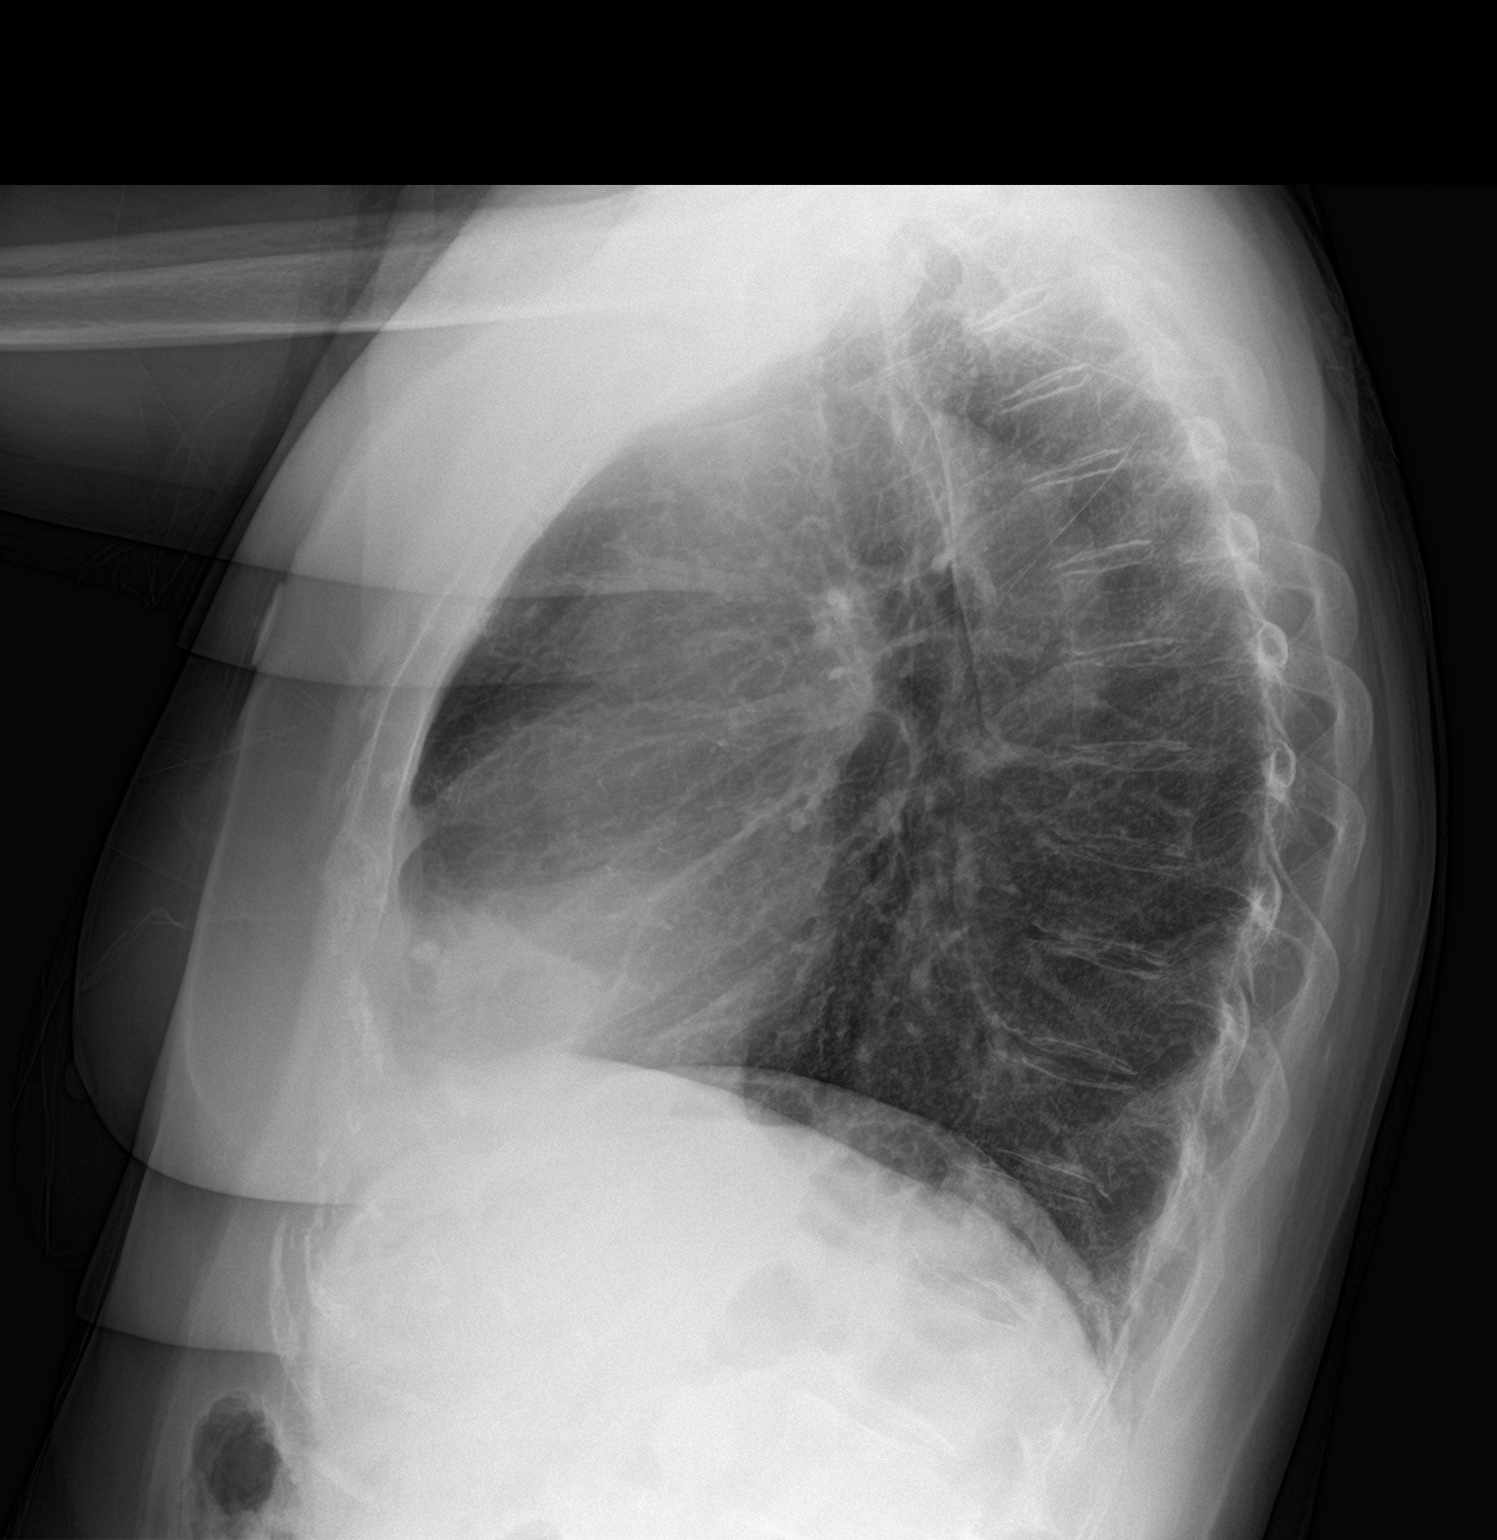

[2 of 2 positions shown; findings below may reference images not displayed]

FINDINGS: No focal consolidation, pleural effusion, pneumothorax. The cardiac
silhouette is within limits. The aorta is mildly tortuous. No acute
osseous pathology.
IMPRESSION: No active cardiopulmonary disease.

## 2019-10-13 MED ORDER — DILTIAZEM HCL 25 MG/5ML IV SOLN: 15.0000 mg | Freq: Once | INTRAVENOUS | Status: DC

## 2019-10-13 MED ORDER — METOPROLOL SUCCINATE ER 25 MG PO TB24
25.0000 mg | ORAL_TABLET | Freq: Every day | ORAL | 11 refills | Status: DC
Start: 2019-10-13 — End: 2019-10-23

## 2019-10-13 MED ORDER — APIXABAN 5 MG PO TABS
5.0000 mg | ORAL_TABLET | Freq: Two times a day (BID) | ORAL | 1 refills | Status: DC
Start: 2019-10-13 — End: 2019-10-18

## 2019-10-13 NOTE — ED Triage Notes (Signed)
First nurse note- pt pulled for EKG. C/o intermittent palpitations over last few week. Tachy 150s, charge RN aware. Discussed with dr Archie Balboa.

## 2019-10-13 NOTE — ED Provider Notes (Signed)
[facility] Emergency Department Provider Note   ____________________________________________   I have reviewed the triage vital signs and the nursing notes.   HISTORY  Chief Complaint Palpitations   History limited by: Not Limited   HPI Christie Wells is a 69 y.o. female who presents to the emergency department today has concerns for palpitations and fast heart rate.  The patient states that she has had similar episodes in the past.  She had them last year and then about a month ago.  She states that today's episode began and she felt a fluttering in her chest.  She found her heart rate to be extremely elevated.  She did try holding her breath to see if that would stop it as she had read on the Internet.  She does think it started to slow her heart rate down.  The patient does have history of thyroid disease and when she talked to her physician about the episodes last year they thought it might be related to her thyroid.  The patient at the time my exam states that she no longer has any fluttering.   Records reviewed. Per medical record review patient has a history of GERD, hypothyroid  Past Medical History:  Diagnosis Date  . Arthritis   . Chronic headaches   . Colitis 1984  . GERD (gastroesophageal reflux disease)   . Hypothyroidism   . Migraines   . UTI (urinary tract infection)     Patient Active Problem List   Diagnosis Date Noted  . Tendonitis of right hand 09/07/2019  . Ear fullness, right 09/07/2019  . Hyperlipidemia 06/20/2019  . Elevated blood pressure reading in office without diagnosis of hypertension 04/18/2019  . Multiple nevi 01/11/2019  . Preventative health care 12/28/2018  . Chronic foot pain 11/04/2018  . Palpitations 11/04/2018  . Dizziness 11/04/2018  . Diarrhea 11/25/2017  . Migraines 10/05/2017  . Gastroesophageal reflux disease 10/05/2017  . Welcome to Medicare preventive visit 10/05/2017  . Chronic back pain 07/14/2017   . Hypothyroidism 04/21/2017  . Vitamin B 12 deficiency 04/21/2017  . Vitamin D deficiency 04/21/2017    Past Surgical History:  Procedure Laterality Date  . BUNIONECTOMY Bilateral   . COLONOSCOPY  2015  . EXCISION OF BREAST BIOPSY Left   . FOOT SURGERY Bilateral    5 screws   . TUBAL LIGATION    . UPPER GI ENDOSCOPY     Gastritis    Prior to Admission medications   Medication Sig Start Date End Date Taking? Authorizing Provider  amitriptyline (ELAVIL) 10 MG tablet Take 0.5-1 tablets by mouth daily as needed. 02/01/17   [provider]  cholecalciferol (VITAMIN D) 1000 units tablet Take 3,000 Units by mouth daily.    [provider]  cyanocobalamin 1000 MCG tablet Take 1,000 mcg by mouth daily.    [provider]  gabapentin (NEURONTIN) 100 MG capsule TAKE 2 CAPSULES BY MOUTH AT BEDTIME 01/02/19   Pleas Koch, NP  lansoprazole (PREVACID) 30 MG capsule TAKE 1 CAPSULE (30 MG TOTAL) BY MOUTH 2 (TWO) TIMES DAILY BEFORE A MEAL. 05/05/19   Thornton Park, MD  levothyroxine (SYNTHROID) 88 MCG tablet TAKE 1 TAB EVERY MORNING ON AN EMPTY STOMACH WITH WATER ONLY. NO FOOD OR OTHER MEDS FOR 30 MIN 06/22/19   Pleas Koch, NP  SUMAtriptan (IMITREX) 50 MG tablet TAKE 1 TABLET BY MOUTH AT MIGRAINE ONSET, MAY REPEAT WITH SECOND TABLET IN 2 HOURS IF MIGRAINE PERSISTS. 05/25/19  Pleas Koch, NP  colestipol (COLESTID) 1 g tablet Take 2 tablets (2 g total) by mouth 2 (two) times daily. Patient not taking: Reported on 11/04/2018 08/10/18   Thornton Park, MD    Allergies Codeine, Peanut-containing drug products, and Sulfa antibiotics  Family History  Problem Relation Age of Onset  . Alzheimer's disease Mother   . Hypertension Mother   . Hyperlipidemia Mother   . COPD Mother   . Stroke Father   . Hypertension Father   . Heart disease Maternal Grandmother   . Parkinson's disease Paternal Grandfather   . Colon cancer Neg Hx   . Esophageal cancer  Neg Hx     Social History Social History   Tobacco Use  . Smoking status: Never Smoker  . Smokeless tobacco: Never Used  Vaping Use  . Vaping Use: Never used  Substance Use Topics  . Alcohol use: Yes    Comment: occ  . Drug use: No    Review of Systems Constitutional: No fever/chills Eyes: No visual changes. ENT: No sore throat. Cardiovascular: Palpitations. Respiratory: Denies shortness of breath. Gastrointestinal: No abdominal pain.  No nausea, no vomiting.  No diarrhea.   Genitourinary: Negative for dysuria. Musculoskeletal: Negative for back pain. Skin: Negative for rash. Neurological: Negative for headaches, focal weakness or numbness.  ____________________________________________   PHYSICAL EXAM:  VITAL SIGNS: ED Triage Vitals [10/13/19 1536]  Enc Vitals Group     BP (!) 144/94     Pulse Rate (!) 150     Resp 18     Temp 98.2 F (36.8 C)     Temp Source Oral     SpO2 96 %     Weight 177 lb (80.3 kg)     Height 5\' 8"  (1.727 m)     Head Circumference      Peak Flow      Pain Score 0   Constitutional: Alert and oriented.  Eyes: Conjunctivae are normal.  ENT      Head: Normocephalic and atraumatic.      Nose: No congestion/rhinnorhea.      Mouth/Throat: Mucous membranes are moist.      Neck: No stridor. Hematological/Lymphatic/Immunilogical: No cervical lymphadenopathy. Cardiovascular: Normal rate, regular rhythm.  No murmurs, rubs, or gallops.  Respiratory: Normal respiratory effort without tachypnea nor retractions. Breath sounds are clear and equal bilaterally. No wheezes/rales/rhonchi. Gastrointestinal: Soft and non tender. No rebound. No guarding.  Genitourinary: Deferred Musculoskeletal: Normal range of motion in all extremities. No lower extremity edema. Neurologic:  Normal speech and language. No gross focal neurologic deficits are appreciated.  Skin:  Skin is warm, dry and intact. No rash noted. Psychiatric: Mood and affect are normal.  Speech and behavior are normal. Patient exhibits appropriate insight and judgment.  ____________________________________________    LABS (pertinent positives/negatives)  Trop hs 7 CBC wbc 11.6, hgb 16.6, plt 216 CMP wnl except glu 131, cr 1.12  ____________________________________________   EKG  I, Nance Pear, attending physician, personally viewed and interpreted this EKG  EKG Time: 1512 Rate: 149 Rhythm: atrial fibrillation with RVR Axis: normal Intervals: qtc 481 QRS: narrow ST changes: no st elevation Impression: abnormal ekg  I, Nance Pear, attending physician, personally viewed and interpreted this EKG  EKG Time: 2119 Rate: 60 Rhythm: sinus rhythm Axis: normal Intervals: qtc 428 QRS: narrow ST changes: no st elevation Impression: normal ekg  ____________________________________________    RADIOLOGY  CXR No acute abnormality  ____________________________________________   PROCEDURES  Procedures  ____________________________________________  INITIAL IMPRESSION / ASSESSMENT AND PLAN / ED COURSE  Pertinent labs & imaging results that were available during my care of the patient were reviewed by me and considered in my medical decision making (see chart for details).   Patient presented to the emergency department today because of concerns for palpitations.  Initial EKG is consistent with atrial fibrillation with RVR.  However by the time of my examination the patient had spontaneously converted back to normal sinus rhythm.  Blood work without concerning abnormalities.  I had a long discussion with patient about A. fib.  I discussed with Dr. Rockey Situ with cardiology.  Did recommend beta-blocker and she did have some hesitancy with anticoagulation.  She stated she would like to at least have the prescription and then she will decide whether to start it or wait till she can speak with her cardiologist directly. Will start patient on  metoprolol.  ____________________________________________   FINAL CLINICAL IMPRESSION(S) / ED DIAGNOSES  Final diagnoses:  Atrial fibrillation with RVR ([place])  Palpitations     Note: This dictation was prepared with Dragon dictation. Any transcriptional errors that result from this process are unintentional     Nance Pear, MD 10/13/19 2232

## 2019-10-13 NOTE — Telephone Encounter (Signed)
[place] Day - Client TELEPHONE ADVICE RECORD [facility] Patient Name: Christie Wells Gender: Female DOB: Jun 02, 1950 Age: 69 Y 52 M 7 D Return Phone Number: [phone] (Primary) Address: City/State/Zip: [place] [address] Client [facility] - Client Client Site [place] - Day Physician Christie Wells - NP Contact Type Call Who Is Calling Patient / Member / Family / Caregiver Call Type Triage / Clinical Relationship To Patient Self Return Phone Number [phone] (Primary) Chief Complaint Heart palpitations or irregular heartbeat Reason for Call Symptomatic / Request for [place] states she is calling because she has heart palpitations. Translation No Nurse Assessment Nurse: Christie Moores, RN, Christie Wells Date/Time (Eastern Time): 10/13/2019 2:32:11 PM Confirm and document reason for call. If symptomatic, describe symptoms. ---Caller states she experienced heart palpitations for about 15 minutes today. States she had it last week and last month as well. States it started at 2 pm and seems to be lessening. Has the patient had close contact with a person known or suspected to have the novel coronavirus illness OR traveled / lives in area with major community spread (including international travel) in the last 14 days from the onset of symptoms? * If Asymptomatic, screen for exposure and travel within the last 14 days. ---Yes Does the patient have any new or worsening symptoms? ---Yes Will a triage be completed? ---Yes Related visit to physician within the last 2 weeks? ---No Does the PT have any chronic conditions? (i.e. diabetes, asthma, this includes High risk factors for pregnancy, etc.) ---Yes List chronic conditions. ---intermittent cardiac palpitations, HTN, migraines, hypothyroid Is this a behavioral health or substance abuse call? ---No Guidelines Guideline Title  Affirmed Question Affirmed Notes Nurse Date/Time (Eastern Time) Heart Rate and Heartbeat Questions [1] Heart beating very rapidly (e.g., > 140 / minute) AND [2] present now (Exception: during exercise) Christie Moores, RN, [place] 10/13/2019 2:35:54 PM Disp. Time Christie Wells Time) Disposition Final User PLEASE NOTE: All timestamps contained within this report are represented as [place] Standard Time. CONFIDENTIALTY NOTICE: This fax transmission is intended only for the addressee. It contains information that is legally privileged, confidential or otherwise protected from use or disclosure. If you are not the intended recipient, you are strictly prohibited from reviewing, disclosing, copying using or disseminating any of this information or taking any action in reliance on or regarding this information. If you have received this fax in error, please notify us immediately by telephone so that we can arrange for its return to [place]. Phone: [phone], Toll-Free: [phone], Fax: [phone] Page: 2 of 2 Call Id: [id number] 10/13/2019 2:42:53 PM Go to ED Now Yes Christie Moores, RN, Christie Wells Caller Disagree/Comply Comply Caller Understands Yes PreDisposition InappropriateToAsk Care Advice Given Per Guideline GO TO ED NOW: * You need to be seen in the Emergency Department. * Go to the ED at ___________ [place] now. Drive carefully. NOTE TO TRIAGER - DRIVING: * Another adult should drive. * If immediate transportation is not available via car or taxi, then the patient should be instructed to call [id number]. ANOTHER ADULT SHOULD DRIVE: * It is better and safer if another adult drives instead of you. BRING MEDICINES: * Please bring a list of your current medicines when you go to the Emergency Department (ER). CARE ADVICE given per Heart Rate and Heartbeat Questions (Adult) guideline. Comments User: Christie Repress, RN Date/Time Christie Wells Time): 10/13/2019 2:44:27 PM Caller states her pulse is 149 and irregular.  States it is  hard to count due to the irregularity and a variable nature of the force of each heartbeat. Strongly recommended patient call 911 in the absence of immediate transportation to the ER and cautioned her not to try to drive herself. User: Christie Repress, RN Date/Time Christie Wells Time): 10/13/2019 2:46:50 PM Attempted to call back line per client directive. No answer and after many rings call went to a fast busy signal. Referrals GO TO FACILITY UNDECIDED

## 2019-10-13 NOTE — ED Notes (Signed)
Called lab to verify had labs to run

## 2019-10-13 NOTE — Telephone Encounter (Signed)
Noted, she just arrived. No labs have been collected at this point. Will wait for ED notes.

## 2019-10-13 NOTE — Telephone Encounter (Signed)
Per chart review tab pt is at [facility] ED now.,

## 2019-10-13 NOTE — ED Notes (Signed)
Pt in room. ER provider at bedside. Pt states over the last month pt has had 3 episodes of fluttering. Pt states it started around 2pm today, but has since resolved.

## 2019-10-13 NOTE — ED Triage Notes (Signed)
See first nurse note. After inserting IV pt states her heart no longer feels like it is pounding. HR 89 at this time. No shob, skin warm and dry

## 2019-10-13 NOTE — Discharge Instructions (Addendum)
Please seek medical attention for any high fevers, chest pain, shortness of breath, change in behavior, persistent vomiting, bloody stool or any other new or concerning symptoms.  

## 2019-10-16 ENCOUNTER — Telehealth: Payer: Self-pay

## 2019-10-16 DIAGNOSIS — I4891 Unspecified atrial fibrillation: Secondary | ICD-10-CM

## 2019-10-16 NOTE — Telephone Encounter (Signed)
We last checked her thyroid function 6 months ago, but I'll add this on for her upcoming labs.

## 2019-10-16 NOTE — Telephone Encounter (Signed)
Pt left v/m that she was seen in [facility] ED on 10/13/19 for Afib with RVR and was asked about thyroid; pt is scheduled for lab testing on 10/20/19 for cholesterol ck. Pt request cb because cannot remember what lab is being tested on 10/20/19 and pt is wanting to know if thyroid lab test are not already scheduled can thyroid testing be done as well. Pt request cb. No future appt scheduled for [facility]. Pt does have new pt appt with Dr Harrell Gave End on 10/18/19.

## 2019-10-17 NOTE — Telephone Encounter (Signed)
Spoken and notified patient of Kate Clark's comments. Verbalized understanding. ° °

## 2019-10-18 ENCOUNTER — Inpatient Hospital Stay: Admission: AD | Admit: 2019-10-18 | Payer: Medicare Other | Source: Ambulatory Visit | Admitting: Internal Medicine

## 2019-10-18 ENCOUNTER — Other Ambulatory Visit: Payer: Self-pay

## 2019-10-18 ENCOUNTER — Encounter: Payer: Self-pay | Admitting: Internal Medicine

## 2019-10-18 ENCOUNTER — Telehealth: Payer: Self-pay | Admitting: Emergency Medicine

## 2019-10-18 ENCOUNTER — Other Ambulatory Visit
Admission: RE | Admit: 2019-10-18 | Discharge: 2019-10-18 | Disposition: A | Discharge status: Home / Self Care | Payer: Medicare Other | Attending: Internal Medicine | Admitting: Internal Medicine

## 2019-10-18 ENCOUNTER — Ambulatory Visit (INDEPENDENT_AMBULATORY_CARE_PROVIDER_SITE_OTHER): Payer: Medicare Other | Admitting: Internal Medicine

## 2019-10-18 VITALS — BP 124/90 | HR 154 | Ht 68.0 in | Wt 173.2 lb

## 2019-10-18 DIAGNOSIS — I4892 Unspecified atrial flutter: Secondary | ICD-10-CM | POA: Diagnosis not present

## 2019-10-18 DIAGNOSIS — E039 Hypothyroidism, unspecified: Secondary | ICD-10-CM | POA: Diagnosis not present

## 2019-10-18 LAB — PROTIME-INR
INR: 1 (ref 0.8–1.2)
Prothrombin Time: 13 seconds (ref 11.4–15.2)

## 2019-10-18 LAB — TSH: TSH: 1.706 u[IU]/mL (ref 0.350–4.500)

## 2019-10-18 MED ORDER — APIXABAN 5 MG PO TABS
5.0000 mg | ORAL_TABLET | Freq: Two times a day (BID) | ORAL | 1 refills | Status: DC
Start: 2019-10-18 — End: 2019-10-23

## 2019-10-18 MED ORDER — MULTAQ 400 MG PO TABS: 400.0000 mg | ORAL_TABLET | Freq: Two times a day (BID) | ORAL | 3 refills | Status: DC

## 2019-10-18 NOTE — Progress Notes (Signed)
New Outpatient Visit Date: 10/18/2019  Primary Care Provider: Pleas Koch, NP [address] [place],  [place] [address]  Chief Complaint: Palpitations  HPI:  [name] is a 69 y.o. female who is being seen today for the evaluation of newly diagnosed atrial fibrillation. She has a history of TIA, hyperlipidemia, hypothyroidism, GERD, and migraine headaches.  She presented to the [facility] emergency department on 10/13/2019 with palpitations.  She was noted to be in atrial fibrillation with rapid ventricular response.  She was discharged on metoprolol succinate 25 mg daily and also given a prescription for apixaban 5 mg twice daily ([name] was unsure if she would like to begin anticoagulation).  Today, [name] reports that she again experienced palpitations starting at 1:45 PM.  She describes a fluttering sensation in her chest.  Since being started on metoprolol after her recent ED visit, she has felt more emotional and tearful.  She also has felt quite a bit of fatigue.  She has yet to start apixaban, as she is concerned about the increased risk for bleeding.  She denies chest pain, shortness of breath, lightheadedness, and edema.  [name] is concerned that her palpitations are related to thyroid dysfunction.  She had similar symptoms over a year ago, which resolved with titration of her thyroid medication.  Palpitations had been absent until late June when she began her having sporadic episodes again.  She denies a history of prior heart disease.  She had an echocardiogram in 2015 while living in [place] after she had a transient episode of word finding difficulty.  She was never given a firm diagnosis for what caused this.  --------------------------------------------------------------------------------------------------  Cardiovascular History & Procedures: Cardiovascular Problems:  Atrial fibrillation  Risk Factors:  Hyperlipidemia and age greater than  7  Cath/PCI:  None  CV Surgery:  None  EP Procedures and Devices:  None  Non-Invasive Evaluation(s):  TTE (05/31/2013, [facility]): Normal LV size and wall thickness.  LVEF 65% with normal diastolic function.  Normal RV size and function.  Borderline dilation of the ascending aorta (3.5 cm).  No significant valvular abnormality.  Recent CV Pertinent Labs: Lab Results  Component Value Date   CHOL 236 (H) 04/18/2019   HDL 65.00 04/18/2019   LDLCALC 152 (H) 04/18/2019   TRIG 97.0 04/18/2019   CHOLHDL 4 04/18/2019   K 3.8 10/13/2019   BUN 18 10/13/2019   CREATININE 1.12 (H) 10/13/2019    --------------------------------------------------------------------------------------------------  Past Medical History:  Diagnosis Date  . Arthritis   . Chronic headaches   . Colitis 1984  . GERD (gastroesophageal reflux disease)   . Hypothyroidism   . Migraines   . TIA (transient ischemic attack)   . UTI (urinary tract infection)     Past Surgical History:  Procedure Laterality Date  . BUNIONECTOMY Bilateral   . COLONOSCOPY  2015  . EXCISION OF BREAST BIOPSY Left   . FOOT SURGERY Bilateral    5 screws   . TUBAL LIGATION    . UPPER GI ENDOSCOPY     Gastritis    Current Meds  Medication Sig  . amitriptyline (ELAVIL) 10 MG tablet Take 0.5-1 tablets by mouth daily as needed.  Marland Kitchen apixaban (ELIQUIS) 5 MG TABS tablet Take 1 tablet (5 mg total) by mouth 2 (two) times daily.  . celecoxib (CELEBREX) 100 MG capsule Take by mouth.  . celecoxib (CELEBREX) 200 MG capsule Take 200 mg by mouth daily as needed.  . cholecalciferol (  VITAMIN D) 1000 units tablet Take 3,000 Units by mouth daily.  . cyanocobalamin 1000 MCG tablet Take 1,000 mcg by mouth daily.  Marland Kitchen gabapentin (NEURONTIN) 100 MG capsule TAKE 2 CAPSULES BY MOUTH AT BEDTIME  . lansoprazole (PREVACID) 30 MG capsule TAKE 1 CAPSULE (30 MG TOTAL) BY MOUTH 2 (TWO) TIMES DAILY BEFORE A MEAL.  Marland Kitchen levothyroxine (SYNTHROID)  88 MCG tablet TAKE 1 TAB EVERY MORNING ON AN EMPTY STOMACH WITH WATER ONLY. NO FOOD OR OTHER MEDS FOR 30 MIN  . metoprolol succinate (TOPROL XL) 25 MG 24 hr tablet Take 1 tablet (25 mg total) by mouth daily.  . SUMAtriptan (IMITREX) 50 MG tablet TAKE 1 TABLET BY MOUTH AT MIGRAINE ONSET, MAY REPEAT WITH SECOND TABLET IN 2 HOURS IF MIGRAINE PERSISTS.    Allergies: Codeine, Peanut-containing drug products, and Sulfa antibiotics  Social History   Tobacco Use  . Smoking status: Never Smoker  . Smokeless tobacco: Never Used  Vaping Use  . Vaping Use: Never used  Substance Use Topics  . Alcohol use: Yes    Alcohol/week: 1.0 standard drink    Types: 1 Shots of liquor per week  . Drug use: No    Family History  Problem Relation Age of Onset  . Alzheimer's disease Mother   . Hypertension Mother   . Hyperlipidemia Mother   . COPD Mother   . Aneurysm Mother   . Stroke Father   . Hypertension Father   . Heart disease Maternal Grandmother   . Parkinson's disease Paternal Grandfather   . Aneurysm Brother   . Colon cancer Neg Hx   . Esophageal cancer Neg Hx     Review of Systems: A 12-system review of systems was performed and was negative except as noted in the HPI.  --------------------------------------------------------------------------------------------------  Physical Exam: BP 124/90 (BP Location: Left Arm, Patient Position: Sitting, Cuff Size: Normal)   Pulse (!) 154   Ht 5\' 8"  (1.727 m)   Wt 173 lb 3.2 oz (78.6 kg)   SpO2 96%   BMI 26.33 kg/m   General: Somewhat anxious appearing and intermittently tearful during exam. HEENT: No conjunctival pallor or scleral icterus. Facemask in place. Neck: Supple without lymphadenopathy, thyromegaly, JVD, or HJR. No carotid bruit. Lungs: Normal work of breathing. Clear to auscultation bilaterally without wheezes or crackles. Heart: Tachycardic and regular without murmurs, rubs, or gallops. Abd: Bowel sounds present. Soft, NT/ND  without hepatosplenomegaly Ext: No lower extremity edema. Radial, PT, and DP pulses are 2+ bilaterally Skin: Warm and dry without rash. Neuro: CNIII-XII intact. Strength and fine-touch sensation intact in upper and lower extremities bilaterally. Psych: Normal mood and affect.  EKG: Atrial flutter with 2:1 AV block and nonspecific ST segment changes.  Repeat EKG at 17:14 shows normal sinus rhythm.  Lab Results  Component Value Date   WBC 11.6 (H) 10/13/2019   HGB 16.6 (H) 10/13/2019   HCT 46.4 (H) 10/13/2019   MCV 88.7 10/13/2019   PLT 216 10/13/2019    Lab Results  Component Value Date   NA 141 10/13/2019   K 3.8 10/13/2019   CL 105 10/13/2019   CO2 25 10/13/2019   BUN 18 10/13/2019   CREATININE 1.12 (H) 10/13/2019   GLUCOSE 131 (H) 10/13/2019   ALT 12 10/13/2019    Lab Results  Component Value Date   CHOL 236 (H) 04/18/2019   HDL 65.00 04/18/2019   LDLCALC 152 (H) 04/18/2019   TRIG 97.0 04/18/2019   CHOLHDL 4 04/18/2019   --------------------------------------------------------------------------------------------------  ASSESSMENT AND PLAN: Atrial flutter: Patient initially presented in atrial flutter with associated palpitations.  She has been taking low-dose metoprolol but is concerned about fatigue and increased emotional lability associated with this.  Initial plan was to directly admit [name] for rate control and possible TEE guided cardioversion.  However, while awaiting a bed, she spontaneously converted back to sinus rhythm.  In light of this, I have recommended that we initiate apixaban 5 mg twice daily with a CHA2DS2-VASc score of 2-4 (age, gender, and possible TIA) as well as dronedarone 400 mg twice daily.  [name] should continue metoprolol succinate 25 mg daily.  We will check an INR and TSH today.  Recent CBC and CMP were unrevealing.  We will arrange for an echocardiogram in the next 2 weeks.  Based on response over the next 1 to 2 weeks, will need  to consider EP referral for long-term rhythm management.  Hypothyroidism: Given recrudescence of palpitations with paroxysmal atrial fibrillation/flutter, I am concerned for iatrogenic hyperthyroidism.  TSH in February was low normal.  We will recheck a TSH today.  Follow-up: Return to clinic in 2 weeks.  Nelva Bush, MD 10/18/2019 4:05 PM

## 2019-10-18 NOTE — Patient Instructions (Addendum)
Medication Instructions:  Your physician has recommended you make the following change in your medication:  1- START Multaq 400 mg by mouth two times a day. 2- START Eliquis 5 mg by mouth two times a day.  *If you need a refill on your cardiac medications before your next appointment, please call your pharmacy*  Lab Work: Your physician recommends that you return for lab work in: TODAY for TSH, PT/INR. - Please go to the [facility]. You will check in at the front desk to the right as you walk into the atrium. Valet Parking is offered if needed. - No appointment needed. You may go any day between 7 am and 6 pm.  If you have labs (blood work) drawn today and your tests are completely normal, you will receive your results only by: Marland Kitchen MyChart Message (if you have MyChart) OR . A paper copy in the mail If you have any lab test that is abnormal or we need to change your treatment, we will call you to review the results.  Testing/Procedures: Your physician has requested that you have an echocardiogram as soon as possible in the next 1-2 weeks. Echocardiography is a painless test that uses sound waves to create images of your heart. It provides your doctor with information about the size and shape of your heart and how well your heart's chambers and valves are working. This procedure takes approximately one hour. There are no restrictions for this procedure. You may get an IV, if needed, to receive an ultrasound enhancing agent through to better visualize your heart.   Follow-Up: At [facility], you and your health needs are our priority.  As part of our continuing mission to provide you with exceptional heart care, we have created designated Provider Care Teams.  These Care Teams include your primary Cardiologist (physician) and Advanced Practice Providers (APPs -  Physician Assistants and Nurse Practitioners) who all work together to provide you with the care you need, when you need  it.  We recommend signing up for the patient portal called "MyChart".  Sign up information is provided on this After Visit Summary.  MyChart is used to connect with patients for Virtual Visits (Telemedicine).  Patients are able to view lab/test results, encounter notes, upcoming appointments, etc.  Non-urgent messages can be sent to your provider as well.   To learn more about what you can do with MyChart, go to [email or web address].    Your next appointment:   2 week(s) with Dr End or APP.  The format for your next appointment:   In Person  Provider:    You may see DR Harrell Gave END or one of the following Advanced Practice Providers on your designated Care Team:    Murray Hodgkins, NP  Christell Faith, PA-C  Marrianne Mood, PA-C    Echocardiogram An echocardiogram is a procedure that uses painless sound waves (ultrasound) to produce an image of the heart. Images from an echocardiogram can provide important information about:  Signs of coronary artery disease (CAD).  Aneurysm detection. An aneurysm is a weak or damaged part of an artery wall that bulges out from the normal force of blood pumping through the body.  Heart size and shape. Changes in the size or shape of the heart can be associated with certain conditions, including heart failure, aneurysm, and CAD.  Heart muscle function.  Heart valve function.  Signs of a past heart attack.  Fluid buildup around the heart.  Thickening of the heart muscle.  A tumor or infectious growth around the heart valves. Tell a health care provider about:  Any allergies you have.  All medicines you are taking, including vitamins, herbs, eye drops, creams, and over-the-counter medicines.  Any blood disorders you have.  Any surgeries you have had.  Any medical conditions you have.  Whether you are pregnant or may be pregnant. What are the risks? Generally, this is a safe procedure. However, problems may occur,  including:  Allergic reaction to dye (contrast) that may be used during the procedure. What happens before the procedure? No specific preparation is needed. You may eat and drink normally. What happens during the procedure?   An IV tube may be inserted into one of your veins.  You may receive contrast through this tube. A contrast is an injection that improves the quality of the pictures from your heart.  A gel will be applied to your chest.  A wand-like tool (transducer) will be moved over your chest. The gel will help to transmit the sound waves from the transducer.  The sound waves will harmlessly bounce off of your heart to allow the heart images to be captured in real-time motion. The images will be recorded on a computer. The procedure may vary among health care providers and hospitals. What happens after the procedure?  You may return to your normal, everyday life, including diet, activities, and medicines, unless your health care provider tells you not to do that. Summary  An echocardiogram is a procedure that uses painless sound waves (ultrasound) to produce an image of the heart.  Images from an echocardiogram can provide important information about the size and shape of your heart, heart muscle function, heart valve function, and fluid buildup around your heart.  You do not need to do anything to prepare before this procedure. You may eat and drink normally.  After the echocardiogram is completed, you may return to your normal, everyday life, unless your health care provider tells you not to do that. This information is not intended to replace advice given to you by your health care provider. Make sure you discuss any questions you have with your health care provider. Document Revised: 06/23/2018 Document Reviewed: 04/04/2016 Elsevier Patient Education  [place].    Medication Samples have been provided to the patient.  Drug name: Eliquis       Strength: 5mg          Qty: 1 box  LOT: [id number]  Exp.Date: march 2023   Medication Samples have been provided to the patient.  Drug name: Multaq       Strength: 400 mg        Qty: 2 packets  LOT: [id number]  Exp.Date: 03/2020

## 2019-10-20 ENCOUNTER — Other Ambulatory Visit: Payer: Medicare Other

## 2019-10-23 ENCOUNTER — Other Ambulatory Visit: Payer: Self-pay | Admitting: Internal Medicine

## 2019-10-23 ENCOUNTER — Telehealth: Payer: Self-pay | Admitting: Internal Medicine

## 2019-10-23 DIAGNOSIS — I4892 Unspecified atrial flutter: Secondary | ICD-10-CM

## 2019-10-23 MED ORDER — AMIODARONE HCL 200 MG PO TABS
ORAL_TABLET | ORAL | 0 refills | Status: DC
Start: 2019-10-23 — End: 2019-11-02

## 2019-10-23 MED ORDER — APIXABAN 5 MG PO TABS: 5.0000 mg | ORAL_TABLET | Freq: Two times a day (BID) | ORAL | 2 refills | Status: DC

## 2019-10-23 MED ORDER — METOPROLOL SUCCINATE ER 25 MG PO TB24: 25.0000 mg | ORAL_TABLET | Freq: Every day | ORAL | 2 refills | Status: DC

## 2019-10-23 MED ORDER — AMIODARONE HCL 200 MG PO TABS
200.0000 mg | ORAL_TABLET | Freq: Every day | ORAL | 0 refills | Status: DC
Start: 2019-10-23 — End: 2019-11-02

## 2019-10-23 NOTE — Telephone Encounter (Signed)
Recommend starting amiodarone 400 mg BID x 1 week followed by 200 mg daily thereafter.  Please arrange for Christie Wells to be seen by Dr. Caryl Comes as soon as possible to discuss other antiarrhythmic options and/or ablation.  Nelva Bush, MD [facility]

## 2019-10-23 NOTE — Telephone Encounter (Signed)
Refills sent for 90 days to [facility].

## 2019-10-23 NOTE — Telephone Encounter (Signed)
Scheduled 8/18 lambert

## 2019-10-23 NOTE — Telephone Encounter (Signed)
Pt c/o medication issue:  1. Name of Medication: multaq  2. How are you currently taking this medication (dosage and times per day)? 400 mg po BID but only taking once daily because she cannot afford refill   3. Are you having a reaction (difficulty breathing--STAT)? no  4. What is your medication issue? Too expensive to refill per insurance the alternatives covered are:  Most popular Amiodarone  Flecainide  Propafenone hcl   Also covered Dofetilide Quinidine sulfate Sotalol HCL

## 2019-10-23 NOTE — Telephone Encounter (Signed)
*  STAT* If patient is at the pharmacy, call can be transferred to refill team.   1. Which medications need to be refilled? (please list name of each medication and dose if known)   eliquis and metoprolol     2. Which pharmacy/location (including street and city if local pharmacy) is medication to be sent to?  [facility]   3. Do they need a 30 day or 90 day supply? 90   Needs [place] leaving [place] country [place]

## 2019-10-23 NOTE — Telephone Encounter (Signed)
Patient verbalized understanding to replace Multaq with Amiodarone 400 mg two times a day for 1 week and then 200 mg once a day thereafter. She asked I send the immediate supply to local [facility] pharmacy and then 90-day supply to [facility].  She is leaving in about 2 weeks for a trip to [place]. Referral for EP entered as urgent.   Routing to scheduling to call patient for the EP referral, needs as soon as possible. Ok to put on Dr Quentin Ore or Klein's schedule.

## 2019-10-24 ENCOUNTER — Telehealth: Payer: Self-pay

## 2019-10-24 ENCOUNTER — Other Ambulatory Visit: Payer: Self-pay

## 2019-10-24 ENCOUNTER — Other Ambulatory Visit (INDEPENDENT_AMBULATORY_CARE_PROVIDER_SITE_OTHER): Payer: Medicare Other

## 2019-10-24 ENCOUNTER — Telehealth: Payer: Self-pay | Admitting: *Deleted

## 2019-10-24 DIAGNOSIS — I4891 Unspecified atrial fibrillation: Secondary | ICD-10-CM

## 2019-10-24 DIAGNOSIS — E785 Hyperlipidemia, unspecified: Secondary | ICD-10-CM

## 2019-10-24 LAB — BASIC METABOLIC PANEL
BUN: 23 mg/dL (ref 6–23)
CO2: 29 mEq/L (ref 19–32)
Calcium: 9.1 mg/dL (ref 8.4–10.5)
Chloride: 105 mEq/L (ref 96–112)
Creatinine, Ser: 1.01 mg/dL (ref 0.40–1.20)
GFR: 54.54 mL/min — ABNORMAL LOW (ref >60.00)
Glucose, Bld: 93 mg/dL (ref 70–99)
Potassium: 3.9 mEq/L (ref 3.5–5.1)
Sodium: 141 mEq/L (ref 135–145)

## 2019-10-24 LAB — LIPID PANEL
Cholesterol: 183 mg/dL (ref 0–200)
HDL: 53 mg/dL (ref >39.00)
LDL Cholesterol: 113 mg/dL — ABNORMAL HIGH (ref 0–99)
NonHDL: 130.12
Total CHOL/HDL Ratio: 3
Triglycerides: 86 mg/dL (ref 0.0–149.0)
VLDL: 17.2 mg/dL (ref 0.0–40.0)

## 2019-10-24 LAB — T4, FREE: Free T4: 1.29 ng/dL (ref 0.60–1.60)

## 2019-10-24 MED ORDER — METOPROLOL SUCCINATE ER 25 MG PO TB24: 25.0000 mg | ORAL_TABLET | Freq: Every day | ORAL | 2 refills | Status: DC

## 2019-10-24 MED ORDER — APIXABAN 5 MG PO TABS: 5.0000 mg | ORAL_TABLET | Freq: Two times a day (BID) | ORAL | 1 refills | Status: DC

## 2019-10-24 NOTE — Telephone Encounter (Signed)
Pt last saw Dr End 10/18/19, last labs 10/13/19 Creat 1.12, age 69, weight 78.6kg, based on specified criteria pt is on appropriate dosage of Eliquis 5mg  BID.  Will refill rx.

## 2019-10-24 NOTE — Telephone Encounter (Signed)
Patient left a voicemail stating that she is coming in today for lab work. Patient stated one of the test that she is scheduled for is to check her thyroid. Patient stated that she has had that done recently at her cardiologist office and does not feel that should be repeated at this time.

## 2019-10-24 NOTE — Telephone Encounter (Signed)
Lab is aware.. We will cancel the TSH test

## 2019-10-24 NOTE — Telephone Encounter (Signed)
Incoming fax from [facility]. Eliquis 5MG  90 days 3 refills.

## 2019-10-28 ENCOUNTER — Other Ambulatory Visit: Payer: Self-pay | Admitting: Primary Care

## 2019-10-28 DIAGNOSIS — G43101 Migraine with aura, not intractable, with status migrainosus: Secondary | ICD-10-CM

## 2019-10-29 NOTE — Progress Notes (Signed)
Cardiology Office Note:    Date:  11/01/2019  . ID:  Christie Wells, DOB [birth year], MRN [id number]  PCP:  Pleas Koch, NP  [facility] Cardiologist:  No primary care provider on file.  [facility] Electrophysiologist:  Vickie Epley, MD   Referring MD: Nelva Bush, MD   No chief complaint on file. Atrial flutter and fibrillation  History of Present Illness:    Christie Wells is a 69 y.o. female with a hx of TIA (TIA in 2015 while living in [place], symptoms of word finding difficulty), hyperlipidemia, hypothyroidism, GERD, migraine headaches, paroxysmal atrial fibrillation presents to the EP clinic today for evaluation of her atrial fibrillation.  Her A. fib was diagnosed October 13, 2019 when she presented to [facility] emergency department with palpitations.  She was found to be in atrial fibrillation with rapid ventricular response.  She was started on Eliquis 5 mg twice daily and metoprolol 25 mg once daily.  She was seen in follow-up on October 18, 2019 when again she was highly symptomatic with atrial flutter with 2-1 AV block.  At that appointment she reported having not started the anticoagulation given a concern for increased risk of bleeding.  At that appointment, the plan was to admit for TEE guided cardioversion but she spontaneously converted back to normal sinus rhythm while waiting to be admitted.  Lab evaluation showed a normal TSH of 1.7.  Previously she had abnormal thyroid studies which she thought could have been contributing to her arrhythmias.  This was approximately 1 year ago. Durign the most recent episode her thyroid function was checked and found to be within normal range.  She is fairly active and a previous Engineer, manufacturing.   Past Medical History:  Diagnosis Date  . Arthritis   . Chronic headaches   . Colitis 1984  . GERD (gastroesophageal reflux disease)   . Hypothyroidism   . Migraines   . TIA (transient ischemic attack)    . UTI (urinary tract infection)     Past Surgical History:  Procedure Laterality Date  . BUNIONECTOMY Bilateral   . COLONOSCOPY  2015  . EXCISION OF BREAST BIOPSY Left   . FOOT SURGERY Bilateral    5 screws   . TUBAL LIGATION    . UPPER GI ENDOSCOPY     Gastritis    Current Medications: Current Meds  Medication Sig  . amiodarone (PACERONE) 200 MG tablet Take 2 tablets (400 mg) by mouth two times a day for 1 week, then decrease to 1 tablet (200 mg) by mouth once a day.  Marland Kitchen amiodarone (PACERONE) 200 MG tablet Take 1 tablet (200 mg total) by mouth daily.  Marland Kitchen amitriptyline (ELAVIL) 10 MG tablet Take 0.5-1 tablets by mouth daily as needed.  Marland Kitchen apixaban (ELIQUIS) 5 MG TABS tablet Take 1 tablet (5 mg total) by mouth 2 (two) times daily.  . celecoxib (CELEBREX) 100 MG capsule Take by mouth.  . celecoxib (CELEBREX) 200 MG capsule Take 200 mg by mouth daily as needed.  . cholecalciferol (VITAMIN D) 1000 units tablet Take 3,000 Units by mouth daily.  . cyanocobalamin 1000 MCG tablet Take 1,000 mcg by mouth daily.  Marland Kitchen gabapentin (NEURONTIN) 100 MG capsule TAKE 2 CAPSULES BY MOUTH AT BEDTIME  . lansoprazole (PREVACID) 30 MG capsule TAKE 1 CAPSULE (30 MG TOTAL) BY MOUTH 2 (TWO) TIMES DAILY BEFORE A MEAL.  Marland Kitchen levothyroxine (SYNTHROID) 88 MCG tablet TAKE 1 TAB EVERY MORNING ON AN EMPTY STOMACH WITH WATER  ONLY. NO FOOD OR OTHER MEDS FOR 30 MIN  . metoprolol succinate (TOPROL XL) 25 MG 24 hr tablet Take 1 tablet (25 mg total) by mouth daily.  . SUMAtriptan (IMITREX) 50 MG tablet TAKE 1 TABLET BY MOUTH AT MIGRAINE ONSET, MAY REPEAT WITH SECOND TABLET IN 2 HOURS IF MIGRAINE PERSISTS.     Allergies:   Codeine, Peanut-containing drug products, and Sulfa antibiotics   Social History   Socioeconomic History  . Marital status: Divorced    Spouse name: Not on file  . Number of children: 2  . Years of education: Not on file  . Highest education level: Not on file  Occupational History  . Not on file    Tobacco Use  . Smoking status: Never Smoker  . Smokeless tobacco: Never Used  Vaping Use  . Vaping Use: Never used  Substance and Sexual Activity  . Alcohol use: Yes    Alcohol/week: 1.0 standard drink    Types: 1 Shots of liquor per week  . Drug use: No  . Sexual activity: Not on file  Other Topics Concern  . Not on file  Social History Narrative   Divorced.   2 children.   Moved from [place].   Once worked as an Journalist, newspaper.       Social Determinants of Health   Financial Resource Strain:   . Difficulty of Paying Living Expenses:   Food Insecurity:   . Worried About Charity fundraiser in the Last Year:   . Arboriculturist in the Last Year:   Transportation Needs:   . Film/video editor (Medical):   Marland Kitchen Lack of Transportation (Non-Medical):   Physical Activity:   . Days of Exercise per Week:   . Minutes of Exercise per Session:   Stress:   . Feeling of Stress :   Social Connections:   . Frequency of Communication with Friends and Family:   . Frequency of Social Gatherings with Friends and Family:   . Attends Religious Services:   . Active Member of Clubs or Organizations:   . Attends Archivist Meetings:   Marland Kitchen Marital Status:      Family History: The patient's family history includes Alzheimer's disease in her mother; Aneurysm in her brother and mother; COPD in her mother; Heart disease in her maternal grandmother; Hyperlipidemia in her mother; Hypertension in her father and mother; Parkinson's disease in her paternal grandfather; Stroke in her father. There is no history of Colon cancer or Esophageal cancer.  ROS:   Please see the history of present illness.    All other systems reviewed and are negative.  EKGs/Labs/Other Studies Reviewed:    The following studies were reviewed today: EKG and echo  10/31/2019 Echo 1. Left ventricular ejection fraction, by estimation, is 55 to 60%. The  left ventricle has normal function. The left  ventricle has no regional  wall motion abnormalities. There is mild left ventricular hypertrophy.  Left ventricular diastolic parameters  are consistent with Grade II diastolic dysfunction (pseudonormalization).  2. Right ventricular systolic function is normal. The right ventricular  size is normal. There is normal pulmonary artery systolic pressure.  3. Left atrial size was mildly dilated.  4. The mitral valve is normal in structure. Mild mitral valve  regurgitation. No evidence of mitral stenosis.  5. The aortic valve is tricuspid. Aortic valve regurgitation is not  visualized. No aortic stenosis is present.  6. Aortic dilatation noted. There is mild dilatation of  the ascending  aorta measuring 39 mm.   EKG on October 18, 2019 at 5:15 PM demonstrates normal sinus rhythm. EKG on October 18, 2019 at 3:32 PM shows typical atrial flutter with 2-1 AV conduction. EKG on October 13, 2019 at 3:12 PM demonstrates atrial fibrillation with rapid ventricular response. EKG on November 07, 2018 shows normal sinus rhythm  EKG:   The ekg ordered today demonstrates sinus bradycardia.  Recent Labs: 10/13/2019: ALT 12; Hemoglobin 16.6; Platelets 216 10/18/2019: TSH 1.706 10/24/2019: BUN 23; Creatinine, Ser 1.01; Potassium 3.9; Sodium 141  Recent Lipid Panel    Component Value Date/Time   CHOL 183 10/24/2019 1300   TRIG 86.0 10/24/2019 1300   HDL 53.00 10/24/2019 1300   CHOLHDL 3 10/24/2019 1300   VLDL 17.2 10/24/2019 1300   LDLCALC 113 (H) 10/24/2019 1300    Physical Exam:    VS:  BP 128/88   Pulse (!) 52   Ht 5\' 8"  (1.727 m)   Wt 171 lb 12.8 oz (77.9 kg)   SpO2 98%   BMI 26.12 kg/m     Wt Readings from Last 3 Encounters:  11/01/19 171 lb 12.8 oz (77.9 kg)  10/18/19 173 lb 3.2 oz (78.6 kg)  10/13/19 177 lb (80.3 kg)     GEN: Well nourished, well developed in no acute distress HEENT: Normal NECK: No JVD; No carotid bruits LYMPHATICS: No lymphadenopathy CARDIAC: RRR, no murmurs, rubs,  gallops RESPIRATORY:  Clear to auscultation without rales, wheezing or rhonchi  ABDOMEN: Soft, non-tender, non-distended MUSCULOSKELETAL:  No edema; No deformity  SKIN: Warm and dry NEUROLOGIC:  Alert and oriented x 3 PSYCHIATRIC:  Normal affect   ASSESSMENT:    1. Atrial flutter, unspecified type ([place])   2. Atrial fibrillation with rapid ventricular response ([facility])    PLAN:    In order of problems listed above:  1. Symptomatic atrial fibrillation and flutter The patient has a history of multiple episodes of symptomatic atrial fibrillation and typical appearing atrial flutter.  Currently, she is maintained on Eliquis 5 mg twice daily and was recently started on amiodarone.  Her other medications are notable for Synthroid.  Given the patient's symptomatic episodes of atrial fibrillation and flutter I discussed the management options with the patient including antiarrhythmic therapy and ablation.  She expressed to me during the appointment that she does not like the idea of being on multiple heart medications.   Given the patient's relatively young age and new onset of atrial fibrillation flutter, I would favor an invasive management with atrial fibrillation and flutter ablation.  This would allow [place] to avoid long-term off target effects of amiodarone. The patient has a chads vas score of at least 2 (age and gender).     Risk, benefits, and alternatives to EP study and radiofrequency ablation for afib were also discussed in detail today. These risks include but are not limited to stroke, bleeding, vascular damage, tamponade, perforation, damage to the esophagus, lungs, and other structures, pulmonary vein stenosis, worsening renal function, and death. The patient understands these risks.  She will discuss the options of ablation with her brother who is a physician and get back in touch with [place] to discuss scheduling, etc. If we proceed with ablation, will need to obtain CT PV protocol prior to  the procedure to exclude LAA thrombus and further evaluate atrial anatomy. Will ask our imaging colleagues to add an aorta protocol to the CT scan at the time of PV protocol given the family history  of aneurysmal aortic disease (brother and mother).    Medication Adjustments/Labs and Tests Ordered: Current medicines are reviewed at length with the patient today.  Concerns regarding medicines are outlined above.  Orders Placed This Encounter  Procedures  . EKG 12-Lead   No orders of the defined types were placed in this encounter.   Patient Instructions  Medication Instructions:  Your physician recommends that you continue on your current medications as directed. Please refer to the Current Medication list given to you today.  Labwork: None ordered.  Testing/Procedures: None ordered.  Follow-Up:  The following dates are available for procedures: October 25, 28 November 5  Any Other Special Instructions Will Be Listed Below (If Applicable).  If you need a refill on your cardiac medications before your next appointment, please call your pharmacy.    Cardiac Ablation Cardiac ablation is a procedure to disable (ablate) a small amount of heart tissue in very specific places. The heart has many electrical connections. Sometimes these connections are abnormal and can cause the heart to beat very fast or irregularly. Ablating some of the problem areas can improve the heart rhythm or return it to normal. Ablation may be done for people who:  Have Wolff-Parkinson-White syndrome.  Have fast heart rhythms (tachycardia).  Have taken medicines for an abnormal heart rhythm (arrhythmia) that were not effective or caused side effects.  Have a high-risk heartbeat that may be life-threatening. During the procedure, a small incision is made in the neck or the groin, and a long, thin, flexible tube (catheter) is inserted into the incision and moved to the heart. Small devices (electrodes) on  the tip of the catheter will send out electrical currents. A type of X-ray (fluoroscopy) will be used to help guide the catheter and to provide images of the heart. Tell a health care provider about:  Any allergies you have.  All medicines you are taking, including vitamins, herbs, eye drops, creams, and over-the-counter medicines.  Any problems you or family members have had with anesthetic medicines.  Any blood disorders you have.  Any surgeries you have had.  Any medical conditions you have, such as kidney failure.  Whether you are pregnant or may be pregnant. What are the risks? Generally, this is a safe procedure. However, problems may occur, including:  Infection.  Bruising and bleeding at the catheter insertion site.  Bleeding into the chest, especially into the sac that surrounds the heart. This is a serious complication.  Stroke or blood clots.  Damage to other structures or organs.  Allergic reaction to medicines or dyes.  Need for a permanent pacemaker if the normal electrical system is damaged. A pacemaker is a small computer that sends electrical signals to the heart and helps your heart beat normally.  The procedure not being fully effective. This may not be recognized until months later. Repeat ablation procedures are sometimes required. What happens before the procedure?  Follow instructions from your health care provider about eating or drinking restrictions.  Ask your health care provider about: ? Changing or stopping your regular medicines. This is especially important if you are taking diabetes medicines or blood thinners. ? Taking medicines such as aspirin and ibuprofen. These medicines can thin your blood. Do not take these medicines before your procedure if your health care provider instructs you not to.  Plan to have someone take you home from the hospital or clinic.  If you will be going home right after the procedure, plan to have someone with  you  for 24 hours. What happens during the procedure?  To lower your risk of infection: ? Your health care team will wash or sanitize their hands. ? Your skin will be washed with soap. ? Hair may be removed from the incision area.  An IV tube will be inserted into one of your veins.  You will be given a medicine to help you relax (sedative).  The skin on your neck or groin will be numbed.  An incision will be made in your neck or your groin.  A needle will be inserted through the incision and into a large vein in your neck or groin.  A catheter will be inserted into the needle and moved to your heart.  Dye may be injected through the catheter to help your surgeon see the area of the heart that needs treatment.  Electrical currents will be sent from the catheter to ablate heart tissue in desired areas. There are three types of energy that may be used to ablate heart tissue: ? Heat (radiofrequency energy). ? Laser energy. ? Extreme cold (cryoablation).  When the necessary tissue has been ablated, the catheter will be removed.  Pressure will be held on the catheter insertion area to prevent excessive bleeding.  A bandage (dressing) will be placed over the catheter insertion area. The procedure may vary among health care providers and hospitals. What happens after the procedure?  Your blood pressure, heart rate, breathing rate, and blood oxygen level will be monitored until the medicines you were given have worn off.  Your catheter insertion area will be monitored for bleeding. You will need to lie still for a few hours to ensure that you do not bleed from the catheter insertion area.  Do not drive for 24 hours or as long as directed by your health care provider. Summary  Cardiac ablation is a procedure to disable (ablate) a small amount of heart tissue in very specific places. Ablating some of the problem areas can improve the heart rhythm or return it to normal.  During the  procedure, electrical currents will be sent from the catheter to ablate heart tissue in desired areas. This information is not intended to replace advice given to you by your health care provider. Make sure you discuss any questions you have with your health care provider. Document Revised: 08/23/2017 Document Reviewed: 01/20/2016 Elsevier Patient Education  2020 [place], Vickie Epley, MD  11/01/2019 1:09 PM    [place]

## 2019-10-30 ENCOUNTER — Telehealth: Payer: Self-pay | Admitting: Internal Medicine

## 2019-10-30 NOTE — Telephone Encounter (Signed)
Pt c/o medication issue:  1. Name of Medication:   amiodarone 400 mg po BID X 1 week then 200 mg po q d   2. How are you currently taking this medication (dosage and times per day)?  Patient has been taking 200 mg po BID by mistake please advise on next weeks new dose given mistake    3. Are you having a reaction (difficulty breathing--STAT)?  No   4. What is your medication issue?   Taking the wrong dose all week    Patient [place] from [place] on [place]th and needs another refill to last at least this long

## 2019-10-31 ENCOUNTER — Other Ambulatory Visit: Payer: Self-pay

## 2019-10-31 ENCOUNTER — Ambulatory Visit (INDEPENDENT_AMBULATORY_CARE_PROVIDER_SITE_OTHER): Payer: Medicare Other

## 2019-10-31 DIAGNOSIS — I4892 Unspecified atrial flutter: Secondary | ICD-10-CM | POA: Diagnosis not present

## 2019-10-31 LAB — ECHOCARDIOGRAM COMPLETE
AR max vel: 3.18 cm2
AV Area VTI: 2.95 cm2
AV Area mean vel: 2.83 cm2
AV Mean grad: 3 mmHg
AV Peak grad: 6 mmHg
Ao pk vel: 1.22 m/s
Area-P 1/2: 5.84 cm2
Calc EF: 58.2 %
S' Lateral: 3.1 cm
Single Plane A2C EF: 57 %
Single Plane A4C EF: 58.4 %

## 2019-10-31 NOTE — Telephone Encounter (Signed)
Patient reports she read the directions of the amiodarone wrong. She start amiodarone 200 mg by mouth two times a day instead of amiodarone 400 mg by mouth two times a day for 7 days. She is on her 7th day today. She is scheduled to see Dr Quentin Ore tomorrow. Advised her that since her appointment is tomorrow to continue with amiodarone 200 mg two times a day and discuss further with Dr Quentin Ore tomorrow.  She agrees with plan.

## 2019-11-01 ENCOUNTER — Ambulatory Visit (INDEPENDENT_AMBULATORY_CARE_PROVIDER_SITE_OTHER): Payer: Medicare Other | Admitting: Cardiology

## 2019-11-01 ENCOUNTER — Encounter: Payer: Self-pay | Admitting: Cardiology

## 2019-11-01 VITALS — BP 128/88 | HR 52 | Ht 68.0 in | Wt 171.8 lb

## 2019-11-01 DIAGNOSIS — I4892 Unspecified atrial flutter: Secondary | ICD-10-CM

## 2019-11-01 DIAGNOSIS — I4891 Unspecified atrial fibrillation: Secondary | ICD-10-CM

## 2019-11-01 NOTE — Patient Instructions (Addendum)
Medication Instructions:  Your physician recommends that you continue on your current medications as directed. Please refer to the Current Medication list given to you today.  Labwork: None ordered.  Testing/Procedures: None ordered.  Follow-Up:  The following dates are available for procedures: October 25, 28 November 5  Any Other Special Instructions Will Be Listed Below (If Applicable).  If you need a refill on your cardiac medications before your next appointment, please call your pharmacy.    Cardiac Ablation Cardiac ablation is a procedure to disable (ablate) a small amount of heart tissue in very specific places. The heart has many electrical connections. Sometimes these connections are abnormal and can cause the heart to beat very fast or irregularly. Ablating some of the problem areas can improve the heart rhythm or return it to normal. Ablation may be done for people who:  Have Wolff-Parkinson-White syndrome.  Have fast heart rhythms (tachycardia).  Have taken medicines for an abnormal heart rhythm (arrhythmia) that were not effective or caused side effects.  Have a high-risk heartbeat that may be life-threatening. During the procedure, a small incision is made in the neck or the groin, and a long, thin, flexible tube (catheter) is inserted into the incision and moved to the heart. Small devices (electrodes) on the tip of the catheter will send out electrical currents. A type of X-ray (fluoroscopy) will be used to help guide the catheter and to provide images of the heart. Tell a health care provider about:  Any allergies you have.  All medicines you are taking, including vitamins, herbs, eye drops, creams, and over-the-counter medicines.  Any problems you or family members have had with anesthetic medicines.  Any blood disorders you have.  Any surgeries you have had.  Any medical conditions you have, such as kidney failure.  Whether you are pregnant or may be  pregnant. What are the risks? Generally, this is a safe procedure. However, problems may occur, including:  Infection.  Bruising and bleeding at the catheter insertion site.  Bleeding into the chest, especially into the sac that surrounds the heart. This is a serious complication.  Stroke or blood clots.  Damage to other structures or organs.  Allergic reaction to medicines or dyes.  Need for a permanent pacemaker if the normal electrical system is damaged. A pacemaker is a small computer that sends electrical signals to the heart and helps your heart beat normally.  The procedure not being fully effective. This may not be recognized until months later. Repeat ablation procedures are sometimes required. What happens before the procedure?  Follow instructions from your health care provider about eating or drinking restrictions.  Ask your health care provider about: ? Changing or stopping your regular medicines. This is especially important if you are taking diabetes medicines or blood thinners. ? Taking medicines such as aspirin and ibuprofen. These medicines can thin your blood. Do not take these medicines before your procedure if your health care provider instructs you not to.  Plan to have someone take you home from the hospital or clinic.  If you will be going home right after the procedure, plan to have someone with you for 24 hours. What happens during the procedure?  To lower your risk of infection: ? Your health care team will wash or sanitize their hands. ? Your skin will be washed with soap. ? Hair may be removed from the incision area.  An IV tube will be inserted into one of your veins.  You will be given  a medicine to help you relax (sedative).  The skin on your neck or groin will be numbed.  An incision will be made in your neck or your groin.  A needle will be inserted through the incision and into a large vein in your neck or groin.  A catheter will be  inserted into the needle and moved to your heart.  Dye may be injected through the catheter to help your surgeon see the area of the heart that needs treatment.  Electrical currents will be sent from the catheter to ablate heart tissue in desired areas. There are three types of energy that may be used to ablate heart tissue: ? Heat (radiofrequency energy). ? Laser energy. ? Extreme cold (cryoablation).  When the necessary tissue has been ablated, the catheter will be removed.  Pressure will be held on the catheter insertion area to prevent excessive bleeding.  A bandage (dressing) will be placed over the catheter insertion area. The procedure may vary among health care providers and hospitals. What happens after the procedure?  Your blood pressure, heart rate, breathing rate, and blood oxygen level will be monitored until the medicines you were given have worn off.  Your catheter insertion area will be monitored for bleeding. You will need to lie still for a few hours to ensure that you do not bleed from the catheter insertion area.  Do not drive for 24 hours or as long as directed by your health care provider. Summary  Cardiac ablation is a procedure to disable (ablate) a small amount of heart tissue in very specific places. Ablating some of the problem areas can improve the heart rhythm or return it to normal.  During the procedure, electrical currents will be sent from the catheter to ablate heart tissue in desired areas. This information is not intended to replace advice given to you by your health care provider. Make sure you discuss any questions you have with your health care provider. Document Revised: 08/23/2017 Document Reviewed: 01/20/2016 Elsevier Patient Education  [place].

## 2019-11-02 ENCOUNTER — Other Ambulatory Visit: Payer: Self-pay

## 2019-11-02 ENCOUNTER — Telehealth: Payer: Self-pay | Admitting: Internal Medicine

## 2019-11-02 ENCOUNTER — Ambulatory Visit: Payer: Medicare Other | Admitting: Family

## 2019-11-02 MED ORDER — AMIODARONE HCL 200 MG PO TABS: 200.0000 mg | ORAL_TABLET | Freq: Every day | ORAL | 0 refills | Status: DC

## 2019-11-02 NOTE — Telephone Encounter (Signed)
*  STAT* If patient is at the pharmacy, call can be transferred to refill team.   1. Which medications need to be refilled? (please list name of each medication and dose if known) amiodarone 200 mg daily  2. Which pharmacy/location (including street and city if local pharmacy) is medication to be sent to? [facility] on [address]  3. Do they need a 30 day or 90 day supply? 30. Patient is going out of town and will run out while gone, patient hoping to get medication before she leaves 8/24 for two weeks

## 2019-11-02 NOTE — Telephone Encounter (Signed)
amiodarone (PACERONE) 200 MG tablet 30 tablet 0 11/02/2019    Sig - Route: Take 1 tablet (200 mg total) by mouth daily. - Oral   Sent to pharmacy as: amiodarone (PACERONE) 200 MG tablet   E-Prescribing Status: Receipt confirmed by pharmacy (11/02/2019  1:12 PM EDT)   Pharmacy  [id number] - [place], [place]

## 2019-11-02 NOTE — Telephone Encounter (Signed)
Patient calling back because she is havng trouble getting enough pills for her trip out of the country from 8[place]24 to 9[place]4.  We sent in a 30 pill supply today but insurance will not fill any more pills because she already has the 30 days worth.  We discussed and I will send in a 90-day supply to see if that will allow her to get some more pills to have with her as she travels.  If not she will try to buy a few pills at cash price if they're not too expensive.  If she still has issues, I asked her to call us back to see if we can do anything else.

## 2019-11-02 NOTE — Addendum Note (Signed)
Addended by: Annia Belt on: 11/02/2019 04:38 PM   Modules accepted: Orders

## 2019-11-14 ENCOUNTER — Other Ambulatory Visit: Payer: Self-pay | Admitting: Internal Medicine

## 2019-11-23 ENCOUNTER — Other Ambulatory Visit: Payer: Self-pay | Admitting: Primary Care

## 2019-11-23 DIAGNOSIS — G43101 Migraine with aura, not intractable, with status migrainosus: Secondary | ICD-10-CM

## 2019-12-14 ENCOUNTER — Other Ambulatory Visit: Payer: Self-pay | Admitting: Primary Care

## 2019-12-14 DIAGNOSIS — E039 Hypothyroidism, unspecified: Secondary | ICD-10-CM

## 2019-12-16 ENCOUNTER — Other Ambulatory Visit: Payer: Self-pay | Admitting: Primary Care

## 2019-12-16 DIAGNOSIS — G43101 Migraine with aura, not intractable, with status migrainosus: Secondary | ICD-10-CM

## 2019-12-25 ENCOUNTER — Ambulatory Visit: Payer: Medicare Other | Admitting: Internal Medicine

## 2019-12-26 ENCOUNTER — Other Ambulatory Visit: Payer: Self-pay | Admitting: Primary Care

## 2019-12-26 DIAGNOSIS — G43101 Migraine with aura, not intractable, with status migrainosus: Secondary | ICD-10-CM

## 2019-12-27 NOTE — Telephone Encounter (Signed)
LAST APPOINTMENT DATE: 12/28/2018 for CPE seen 09/07/2019 for acute issues.    NEXT APPOINTMENT DATE: Visit date not found    LAST REFILL: 01/02/2019  QTY: #180 3 rf

## 2019-12-27 NOTE — Telephone Encounter (Signed)
Please notify patient that she is due for her annual physical with me and her wellness visit with the nurse. I will need to see her in order to continue refilling her medications.  I will send a refill of her gabapentin for now, please get her set up for CPE with me and MWV with Calandra.

## 2020-01-03 NOTE — Telephone Encounter (Signed)
Called patient to schedule for cpe. LVM to call back.

## 2020-01-05 ENCOUNTER — Other Ambulatory Visit: Payer: Self-pay | Admitting: Internal Medicine

## 2020-01-05 NOTE — Telephone Encounter (Signed)
Pt is going to [facility] to have ablation. Pt will be contacting that office to get refill due to [facility] Provider possibly stopping amiodarone. Pt will contact our office if needing refill.

## 2020-01-05 NOTE — Telephone Encounter (Signed)
FYI

## 2020-01-05 NOTE — Telephone Encounter (Signed)
Viewed schedule and saw patient already scheduled for cpe.

## 2020-01-16 ENCOUNTER — Encounter: Payer: Self-pay | Admitting: Primary Care

## 2020-01-16 ENCOUNTER — Ambulatory Visit (INDEPENDENT_AMBULATORY_CARE_PROVIDER_SITE_OTHER): Payer: Medicare Other | Admitting: Primary Care

## 2020-01-16 ENCOUNTER — Other Ambulatory Visit: Payer: Self-pay

## 2020-01-16 VITALS — BP 134/68 | HR 53 | Temp 97.8°F | Ht 68.0 in | Wt 178.0 lb

## 2020-01-16 DIAGNOSIS — I4892 Unspecified atrial flutter: Secondary | ICD-10-CM | POA: Diagnosis not present

## 2020-01-16 DIAGNOSIS — F419 Anxiety disorder, unspecified: Secondary | ICD-10-CM

## 2020-01-16 DIAGNOSIS — R030 Elevated blood-pressure reading, without diagnosis of hypertension: Secondary | ICD-10-CM

## 2020-01-16 DIAGNOSIS — E039 Hypothyroidism, unspecified: Secondary | ICD-10-CM | POA: Diagnosis not present

## 2020-01-16 DIAGNOSIS — Z23 Encounter for immunization: Secondary | ICD-10-CM

## 2020-01-16 DIAGNOSIS — R002 Palpitations: Secondary | ICD-10-CM | POA: Diagnosis not present

## 2020-01-16 NOTE — Patient Instructions (Addendum)
Proceed with your ablation scheduled for November 30th.  We will see you in December as scheduled.  It was a pleasure to see you today!   Influenza (Flu) Vaccine (Inactivated or Recombinant): What You Need to Know 1. Why get vaccinated? Influenza vaccine can prevent influenza (flu). Flu is a contagious disease that spreads around the [place] every year, usually between October and May. Anyone can get the flu, but it is more dangerous for some people. Infants and young children, people 68 years of age and older, pregnant women, and people with certain health conditions or a weakened immune system are at greatest risk of flu complications. Pneumonia, bronchitis, sinus infections and ear infections are examples of flu-related complications. If you have a medical condition, such as heart disease, cancer or diabetes, flu can make it worse. Flu can cause fever and chills, sore throat, muscle aches, fatigue, cough, headache, and runny or stuffy nose. Some people may have vomiting and diarrhea, though this is more common in children than adults. Each year thousands of people in the [place] die from flu, and many more are hospitalized. Flu vaccine prevents millions of illnesses and flu-related visits to the doctor each year. 2. Influenza vaccine CDC recommends everyone 68 months of age and older get vaccinated every flu season. Children 6 months through 17 years of age may need 2 doses during a single flu season. Everyone else needs only 1 dose each flu season. It takes about 2 weeks for protection to develop after vaccination. There are many flu viruses, and they are always changing. Each year a new flu vaccine is made to protect against three or four viruses that are likely to cause disease in the upcoming flu season. Even when the vaccine doesn't exactly match these viruses, it may still provide some protection. Influenza vaccine does not cause flu. Influenza vaccine may be given at the same  time as other vaccines. 3. Talk with your health care provider Tell your vaccine provider if the person getting the vaccine:  Has had an allergic reaction after a previous dose of influenza vaccine, or has any severe, life-threatening allergies.  Has ever had Guillain-Barr Syndrome (also called GBS). In some cases, your health care provider may decide to postpone influenza vaccination to a future visit. People with minor illnesses, such as a cold, may be vaccinated. People who are moderately or severely ill should usually wait until they recover before getting influenza vaccine. Your health care provider can give you more information. 4. Risks of a vaccine reaction  Soreness, redness, and swelling where shot is given, fever, muscle aches, and headache can happen after influenza vaccine.  There may be a very small increased risk of Guillain-Barr Syndrome (GBS) after inactivated influenza vaccine (the flu shot). Young children who get the flu shot along with pneumococcal vaccine (PCV13), and/or DTaP vaccine at the same time might be slightly more likely to have a seizure caused by fever. Tell your health care provider if a child who is getting flu vaccine has ever had a seizure. People sometimes faint after medical procedures, including vaccination. Tell your provider if you feel dizzy or have vision changes or ringing in the ears. As with any medicine, there is a very remote chance of a vaccine causing a severe allergic reaction, other serious injury, or death. 5. What if there is a serious problem? An allergic reaction could occur after the vaccinated person leaves the clinic. If you see signs of a severe allergic reaction (hives,  swelling of the face and throat, difficulty breathing, a fast heartbeat, dizziness, or weakness), call 9-1-1 and get the person to the nearest hospital. For other signs that concern you, call your health care provider. Adverse reactions should be reported to the  Vaccine Adverse Event Reporting System (VAERS). Your health care provider will usually file this report, or you can do it yourself. Visit the VAERS website at www.vaers.SamedayNews.es or call 503-109-3248.VAERS is only for reporting reactions, and VAERS staff do not give medical advice. 6. The National Vaccine Injury Compensation Program The [employer] Vaccine Injury Compensation Program (VICP) is a federal program that was created to compensate people who may have been injured by certain vaccines. Visit the VICP website at GoldCloset.com.ee or call (830)612-9222 to learn about the program and about filing a claim. There is a time limit to file a claim for compensation. 7. How can I learn more?  Ask your healthcare provider.  Call your local or state health department.  Contact the Centers for Disease Control and Prevention (CDC): ? Call 539-861-9898 (1-800-CDC-INFO) or ? Visit CDC's https://gibson.com/ Vaccine Information Statement (Interim) Inactivated Influenza Vaccine (10/28/2017) This information is not intended to replace advice given to you by your health care provider. Make sure you discuss any questions you have with your health care provider. Document Revised: 06/21/2018 Document Reviewed: 11/01/2017 Elsevier Patient Education  [place].
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
## 2020-01-16 NOTE — Assessment & Plan Note (Signed)
Recent TSH at goal. Continue levothyroxine 88 mcg.

## 2020-01-16 NOTE — Assessment & Plan Note (Addendum)
No recent episodes of palpations. Has a scheduled ablation for 02/13/20.  Proceed with ablation. Continue Amiodarone, metoprolol & Eliqus Will follow up after this procedure.    Following with [facility].  Agree with assessment and plan. Pleas Koch, NP

## 2020-01-16 NOTE — Assessment & Plan Note (Signed)
Diagnosed as paroxysmal atrial flutter/fibrillation. Will undergo atrial flutter cardiac ablation in late November 2021.  Following with electrophysiology and cardiology.

## 2020-01-16 NOTE — Assessment & Plan Note (Signed)
Chronic for years, panic attacks once every few months.  Currently undergoing treatment for atrial flutter which is causing palpitations, will await treatment first for atrial flutter and reevaluate anxiety.  She agrees.

## 2020-01-16 NOTE — Assessment & Plan Note (Signed)
Borderline too high, she is now on losartan for which she does not recall the dose.  She will notify us later of the dose.  We will have her monitor home readings.
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
## 2020-02-22 HISTORY — PX: CARDIAC ELECTROPHYSIOLOGY STUDY AND ABLATION: SHX1294

## 2020-02-27 ENCOUNTER — Ambulatory Visit: Payer: Medicare Other

## 2020-02-27 ENCOUNTER — Other Ambulatory Visit: Payer: Medicare Other

## 2020-03-01 ENCOUNTER — Telehealth: Payer: Self-pay

## 2020-03-01 ENCOUNTER — Ambulatory Visit: Payer: Medicare Other | Admitting: Primary Care

## 2020-03-01 DIAGNOSIS — K219 Gastro-esophageal reflux disease without esophagitis: Secondary | ICD-10-CM

## 2020-03-01 MED ORDER — LANSOPRAZOLE 30 MG PO CPDR: 30.0000 mg | DELAYED_RELEASE_CAPSULE | Freq: Two times a day (BID) | ORAL | 1 refills | Status: DC

## 2020-03-01 NOTE — Telephone Encounter (Signed)
Received fax request from [facility] for refill of lansoprazole, 30mg , #90, 3RF. Last RF was sent in by Dr.Beavers.   Last OV: 01/16/20 Next OV: 03/04/20 Last fill: 05/05/19 sent to [facility], pt requesting it sent to [facility]

## 2020-03-01 NOTE — Telephone Encounter (Signed)
Noted, Refill(s) sent to pharmacy.  

## 2020-03-04 ENCOUNTER — Ambulatory Visit (INDEPENDENT_AMBULATORY_CARE_PROVIDER_SITE_OTHER): Payer: Medicare Other | Admitting: Primary Care

## 2020-03-04 ENCOUNTER — Encounter: Payer: Self-pay | Admitting: Primary Care

## 2020-03-04 ENCOUNTER — Other Ambulatory Visit: Payer: Self-pay

## 2020-03-04 VITALS — BP 134/62 | HR 60 | Temp 97.6°F | Ht 67.5 in | Wt 181.0 lb

## 2020-03-04 DIAGNOSIS — I4892 Unspecified atrial flutter: Secondary | ICD-10-CM

## 2020-03-04 DIAGNOSIS — K219 Gastro-esophageal reflux disease without esophagitis: Secondary | ICD-10-CM

## 2020-03-04 DIAGNOSIS — Z0000 Encounter for general adult medical examination without abnormal findings: Secondary | ICD-10-CM | POA: Diagnosis not present

## 2020-03-04 DIAGNOSIS — E559 Vitamin D deficiency, unspecified: Secondary | ICD-10-CM

## 2020-03-04 DIAGNOSIS — M79673 Pain in unspecified foot: Secondary | ICD-10-CM

## 2020-03-04 DIAGNOSIS — E039 Hypothyroidism, unspecified: Secondary | ICD-10-CM

## 2020-03-04 DIAGNOSIS — E785 Hyperlipidemia, unspecified: Secondary | ICD-10-CM

## 2020-03-04 DIAGNOSIS — F419 Anxiety disorder, unspecified: Secondary | ICD-10-CM

## 2020-03-04 DIAGNOSIS — Z23 Encounter for immunization: Secondary | ICD-10-CM

## 2020-03-04 DIAGNOSIS — I480 Paroxysmal atrial fibrillation: Secondary | ICD-10-CM | POA: Insufficient documentation

## 2020-03-04 DIAGNOSIS — Z1231 Encounter for screening mammogram for malignant neoplasm of breast: Secondary | ICD-10-CM

## 2020-03-04 DIAGNOSIS — M545 Low back pain, unspecified: Secondary | ICD-10-CM

## 2020-03-04 DIAGNOSIS — G43109 Migraine with aura, not intractable, without status migrainosus: Secondary | ICD-10-CM

## 2020-03-04 DIAGNOSIS — G8929 Other chronic pain: Secondary | ICD-10-CM

## 2020-03-04 DIAGNOSIS — E2839 Other primary ovarian failure: Secondary | ICD-10-CM

## 2020-03-04 DIAGNOSIS — R42 Dizziness and giddiness: Secondary | ICD-10-CM

## 2020-03-04 MED ORDER — GABAPENTIN 300 MG PO CAPS: 300.0000 mg | ORAL_CAPSULE | Freq: Every day | ORAL | 0 refills | Status: DC

## 2020-03-04 NOTE — Assessment & Plan Note (Addendum)
Ongoing, mostly bothersome during the evening.  History of bunionectomy.  Plans on evaluation with podiatry.   Will increase gabapentin to 300 mg to see if this helps with night time symptoms. She will update. New Rx sent to pharmacy.

## 2020-03-04 NOTE — Assessment & Plan Note (Signed)
Underwent ablation recently. Following with electrophysiology through [facility].  No longer on amiodarone, continue apixaban 5 mg BID, metoprolol succinate 25 mg.

## 2020-03-04 NOTE — Patient Instructions (Addendum)
We increased the dose of your gabapentin to 300 mg. Take one capsule by mouth at bedtime for pain and rest.  Start exercising. You should be getting 150 minutes of moderate intensity exercise weekly.  It's important to improve your diet by reducing consumption of fast food, fried food, processed snack foods, sugary drinks. Increase consumption of fresh vegetables and fruits, whole grains, water.  Ensure you are drinking 64 ounces of water daily.  Schedule a lab appointment for March 2022 to repeat your cholesterol.  It was a pleasure to see you today!   Preventive Care 68 Years and Older, Female Preventive care refers to lifestyle choices and visits with your health care provider that can promote health and wellness. This includes:  A yearly physical exam. This is also called an annual well check.  Regular dental and eye exams.  Immunizations.  Screening for certain conditions.  Healthy lifestyle choices, such as diet and exercise. What can I expect for my preventive care visit? Physical exam Your health care provider will check:  Height and weight. These may be used to calculate body mass index (BMI), which is a measurement that tells if you are at a healthy weight.  Heart rate and blood pressure.  Your skin for abnormal spots. Counseling Your health care provider may ask you questions about:  Alcohol, tobacco, and drug use.  Emotional well-being.  Home and relationship well-being.  Sexual activity.  Eating habits.  History of falls.  Memory and ability to understand (cognition).  Work and work Statistician.  Pregnancy and menstrual history. What immunizations do I need?  Influenza (flu) vaccine  This is recommended every year. Tetanus, diphtheria, and pertussis (Tdap) vaccine  You may need a Td booster every 10 years. Varicella (chickenpox) vaccine  You may need this vaccine if you have not already been vaccinated. Zoster (shingles) vaccine  You may  need this after age 68. Pneumococcal conjugate (PCV13) vaccine  One dose is recommended after age 68. Pneumococcal polysaccharide (PPSV23) vaccine  One dose is recommended after age 68. Measles, mumps, and rubella (MMR) vaccine  You may need at least one dose of MMR if you were born in 1957 or later. You may also need a second dose. Meningococcal conjugate (MenACWY) vaccine  You may need this if you have certain conditions. Hepatitis A vaccine  You may need this if you have certain conditions or if you travel or work in places where you may be exposed to hepatitis A. Hepatitis B vaccine  You may need this if you have certain conditions or if you travel or work in places where you may be exposed to hepatitis B. Haemophilus influenzae type b (Hib) vaccine  You may need this if you have certain conditions. You may receive vaccines as individual doses or as more than one vaccine together in one shot (combination vaccines). Talk with your health care provider about the risks and benefits of combination vaccines. What tests do I need? Blood tests  Lipid and cholesterol levels. These may be checked every 5 years, or more frequently depending on your overall health.  Hepatitis C test.  Hepatitis B test. Screening  Lung cancer screening. You may have this screening every year starting at age 68 if you have a 30-pack-year history of smoking and currently smoke or have quit within the past 15 years.  Colorectal cancer screening. All adults should have this screening starting at age 68 and continuing until age 71. Your health care provider may recommend screening at  age 68 if you are at increased risk. You will have tests every 1-10 years, depending on your results and the type of screening test.  Diabetes screening. This is done by checking your blood sugar (glucose) after you have not eaten for a while (fasting). You may have this done every 1-3 years.  Mammogram. This may be done every  1-2 years. Talk with your health care provider about how often you should have regular mammograms.  BRCA-related cancer screening. This may be done if you have a family history of breast, ovarian, tubal, or peritoneal cancers. Other tests  Sexually transmitted disease (STD) testing.  Bone density scan. This is done to screen for osteoporosis. You may have this done starting at age 68. Follow these instructions at home: Eating and drinking  Eat a diet that includes fresh fruits and vegetables, whole grains, lean protein, and low-fat dairy products. Limit your intake of foods with high amounts of sugar, saturated fats, and salt.  Take vitamin and mineral supplements as recommended by your health care provider.  Do not drink alcohol if your health care provider tells you not to drink.  If you drink alcohol: ? Limit how much you have to 0-1 drink a day. ? Be aware of how much alcohol is in your drink. In the [place] one drink equals one 12 oz bottle of beer (355 mL), one 5 oz glass of wine (148 mL), or one 1 oz glass of hard liquor (44 mL). Lifestyle  Take daily care of your teeth and gums.  Stay active. Exercise for at least 30 minutes on 5 or more days each week.  Do not use any products that contain nicotine or tobacco, such as cigarettes, e-cigarettes, and chewing tobacco. If you need help quitting, ask your health care provider.  If you are sexually active, practice safe sex. Use a condom or other form of protection in order to prevent STIs (sexually transmitted infections).  Talk with your health care provider about taking a low-dose aspirin or statin. What's next?  Go to your health care provider once a year for a well check visit.  Ask your health care provider how often you should have your eyes and teeth checked.  Stay up to date on all vaccines. This information is not intended to replace advice given to you by your health care provider. Make sure you discuss any questions  you have with your health care provider. Document Revised: 02/24/2018 Document Reviewed: 02/24/2018 Elsevier Patient Education  2020 [employer].

## 2020-03-04 NOTE — Assessment & Plan Note (Signed)
Doing well on lansoprazole 30 mg BID, continue same.

## 2020-03-04 NOTE — Assessment & Plan Note (Signed)
Improved overall, stable and able to manage on her own. Continue to monitor.

## 2020-03-04 NOTE — Assessment & Plan Note (Signed)
Pneumovax due, provided today. Other vaccines UTD. Mammogram UTD. Bone density scan due, she will have this done in Summer 2022 with mammogram.  Discussed the importance of a healthy diet and regular exercise in order for weight loss, and to reduce the risk of any potential medical problems.  Exam today stable. Labs reviewed.

## 2020-03-04 NOTE — Assessment & Plan Note (Signed)
Compliant to vitamin D and calcium. Repeat bone density scan due, she will have this done in Summer 2022 with her mammogram.

## 2020-03-04 NOTE — Assessment & Plan Note (Signed)
LDL of 152 on labs from February 2021 with ASCVD risk score today of 10.9%. discussed this with patient today, strong FH of stroke.   She would like a few months to work on lifestyle changes, will repeat lipids in March 2022.

## 2020-03-04 NOTE — Assessment & Plan Note (Signed)
Improving  Continue to monitor 

## 2020-03-04 NOTE — Assessment & Plan Note (Signed)
Diagnosed recently per electrophysiology during atrial flutter ablation. Continue Eliquis 5 mg BID, metoprolol succinate Xl 25 mg.

## 2020-03-04 NOTE — Assessment & Plan Note (Signed)
She is taking levothyroxine 88 mcg daily, she is taking lansoprazole  30 minutes later. TSH level in August 2021 at goal. Discussed correct instructions for levothyroxine administration.   Continue levothyroxine 88 mcg.

## 2020-03-04 NOTE — Assessment & Plan Note (Addendum)
No recent migraines, continue to monitor.  Will increase gabapentin to 300 mg for back pain and foot pain.

## 2020-03-04 NOTE — Addendum Note (Signed)
Addended by: Francella Solian on: 03/04/2020 03:44 PM   Modules accepted: Orders

## 2020-03-04 NOTE — Assessment & Plan Note (Addendum)
Chronic, continued. Following with orthopedics, plans on resuming physical therapy in early 2022.  Will increase gabapentin to 300 mg to see if this helps with night time symptoms. She will update. New Rx sent to pharmacy.

## 2020-03-04 NOTE — Progress Notes (Signed)
Subjective:    Patient ID: Christie Wells, female    DOB: 12/19/1950, 69 y.o.   MRN: [id number]  HPI  This visit occurred during the SARS-CoV-2 public health emergency.  Safety protocols were in place, including screening questions prior to the visit, additional usage of staff PPE, and extensive cleaning of exam room while observing appropriate contact time as indicated for disinfecting solutions.   [name] is a 69 year old female who presents today for complete physical.  Immunizations: -Tetanus: Completed in 2016 -Influenza: Completed this season  -Shingles: Completed series -Pneumonia: Completed Prevnar in 2020 -Covid-19: Completed series  Diet: She endorses a poor diet.  Exercise: She is active.  Eye exam: Completes annually  Dental exam: Completes semi-annually   Mammogram: Completed in July 2021 Dexa: Completed in 2019 Colonoscopy: Completed in 2020, due in 2023 Hep C Screen: Negative  BP Readings from Last 3 Encounters:  03/04/20 134/62  01/16/20 134/68  11/01/19 128/88   The 10-year ASCVD risk score Mikey Bussing DC Jr., et al., 2013) is: 10.9%   Values used to calculate the score:     Age: 40 years     Sex: Female     Is Non-Hispanic African American: No     Diabetic: No     Tobacco smoker: No     Systolic Blood Pressure: 570 mmHg     Is BP treated: Yes     HDL Cholesterol: 53 mg/dL     Total Cholesterol: 183 mg/dL   Review of Systems  Constitutional: Negative for unexpected weight change.  HENT: Negative for rhinorrhea.   Eyes: Negative for visual disturbance.  Respiratory: Negative for cough and shortness of breath.   Cardiovascular: Negative for chest pain.  Gastrointestinal: Negative for constipation and diarrhea.  Genitourinary: Negative for difficulty urinating and menstrual problem.  Musculoskeletal: Positive for arthralgias and back pain.  Skin: Negative for rash.  Allergic/Immunologic: Negative for environmental allergies.  Neurological:  Negative for dizziness and headaches.  Hematological: Negative for adenopathy.  Psychiatric/Behavioral: The patient is nervous/anxious.        Past Medical History:  Diagnosis Date  . Arthritis   . Chronic headaches   . Colitis 1984  . GERD (gastroesophageal reflux disease)   . Hypothyroidism   . Migraines   . TIA (transient ischemic attack)   . UTI (urinary tract infection)      Social History   Socioeconomic History  . Marital status: Divorced    Spouse name: Not on file  . Number of children: 2  . Years of education: Not on file  . Highest education level: Not on file  Occupational History  . Not on file  Tobacco Use  . Smoking status: Never Smoker  . Smokeless tobacco: Never Used  Vaping Use  . Vaping Use: Never used  Substance and Sexual Activity  . Alcohol use: Yes    Alcohol/week: 1.0 standard drink    Types: 1 Shots of liquor per week  . Drug use: No  . Sexual activity: Not on file  Other Topics Concern  . Not on file  Social History Narrative   Divorced.   2 children.   Moved from [place].   Once worked as an Journalist, newspaper.       Social Determinants of Health   Financial Resource Strain: Not on file  Food Insecurity: Not on file  Transportation Needs: Not on file  Physical Activity: Not on file  Stress: Not on file  Social Connections: Not  on file  Intimate Partner Violence: Not on file    Past Surgical History:  Procedure Laterality Date  . BUNIONECTOMY Bilateral   . CARDIAC ELECTROPHYSIOLOGY STUDY AND ABLATION  02/22/2020  . COLONOSCOPY  2015  . EXCISION OF BREAST BIOPSY Left   . FOOT SURGERY Bilateral    5 screws   . TUBAL LIGATION    . UPPER GI ENDOSCOPY     Gastritis    Family History  Problem Relation Age of Onset  . Alzheimer's disease Mother   . Hypertension Mother   . Hyperlipidemia Mother   . COPD Mother   . Aneurysm Mother   . Stroke Father   . Hypertension Father   . Heart disease Maternal Grandmother   .  Parkinson's disease Paternal Grandfather   . Aneurysm Brother   . Colon cancer Neg Hx   . Esophageal cancer Neg Hx     Allergies  Allergen Reactions  . Codeine Nausea And Vomiting    Other reaction(s): vomiting   . Peanut-Containing Drug Products     Headaches  . Sulfa Antibiotics Rash    Other reaction(s): rash    Current Outpatient Medications on File Prior to Visit  Medication Sig Dispense Refill  . apixaban (ELIQUIS) 5 MG TABS tablet Take 1 tablet (5 mg total) by mouth 2 (two) times daily. 180 tablet 1  . celecoxib (CELEBREX) 100 MG capsule Take 100 mg by mouth 2 (two) times daily.    . cholecalciferol (VITAMIN D) 1000 units tablet Take 3,000 Units by mouth daily.    . cyanocobalamin 1000 MCG tablet Take 1,000 mcg by mouth daily.    . lansoprazole (PREVACID) 30 MG capsule Take 1 capsule (30 mg total) by mouth 2 (two) times daily before a meal. 180 capsule 1  . levothyroxine (SYNTHROID) 88 MCG tablet TAKE 1 TAB EVERY MORNING ON AN EMPTY STOMACH WITH WATER ONLY. NO FOOD OR OTHER MEDS FOR 30 MIN 90 tablet 1  . losartan (COZAAR) 25 MG tablet Take 25 mg by mouth daily.    . metoprolol succinate (TOPROL XL) 25 MG 24 hr tablet Take 1 tablet (25 mg total) by mouth daily. 90 tablet 2  . SUMAtriptan (IMITREX) 50 MG tablet TAKE 1 TABLET BY MOUTH AT MIGRAINE ONSET, MAY REPEAT WITH SECOND TABLET IN 2 HOURS IF MIGRAINE PERSISTS. 10 tablet 0   No current facility-administered medications on file prior to visit.    BP 134/62   Pulse 60   Temp 97.6 F (36.4 C) (Temporal)   Ht 5' 7.5" (1.715 m)   Wt 181 lb (82.1 kg)   SpO2 98%   BMI 27.93 kg/m    Objective:   Physical Exam Constitutional:      Appearance: She is well-nourished.  HENT:     Right Ear: Tympanic membrane and ear canal normal.     Left Ear: Tympanic membrane and ear canal normal.     Mouth/Throat:     Mouth: Oropharynx is clear and moist.  Eyes:     Extraocular Movements: EOM normal.     Pupils: Pupils are equal,  round, and reactive to light.  Cardiovascular:     Rate and Rhythm: Normal rate and regular rhythm.  Pulmonary:     Effort: Pulmonary effort is normal.     Breath sounds: Normal breath sounds.  Abdominal:     General: Bowel sounds are normal.     Palpations: Abdomen is soft.     Tenderness: There is no abdominal  tenderness.  Musculoskeletal:        General: Normal range of motion.     Cervical back: Neck supple.  Skin:    General: Skin is warm and dry.  Neurological:     Mental Status: She is alert and oriented to person, place, and time.     Cranial Nerves: No cranial nerve deficit.     Deep Tendon Reflexes:     Reflex Scores:      Patellar reflexes are 2+ on the right side and 2+ on the left side. Psychiatric:        Mood and Affect: Mood and affect and mood normal.            Assessment & Plan:

## 2020-03-16 DIAGNOSIS — H269 Unspecified cataract: Secondary | ICD-10-CM

## 2020-03-16 HISTORY — DX: Unspecified cataract: H26.9

## 2020-03-16 HISTORY — PX: CATARACT EXTRACTION, BILATERAL: SHX1313

## 2020-03-30 DIAGNOSIS — G8929 Other chronic pain: Secondary | ICD-10-CM

## 2020-03-30 DIAGNOSIS — G43109 Migraine with aura, not intractable, without status migrainosus: Secondary | ICD-10-CM

## 2020-03-30 DIAGNOSIS — M79673 Pain in unspecified foot: Secondary | ICD-10-CM

## 2020-03-30 DIAGNOSIS — M545 Low back pain, unspecified: Secondary | ICD-10-CM

## 2020-04-02 MED ORDER — GABAPENTIN 300 MG PO CAPS: 300.0000 mg | ORAL_CAPSULE | Freq: Every day | ORAL | 0 refills | Status: DC

## 2020-05-03 ENCOUNTER — Other Ambulatory Visit: Payer: Self-pay

## 2020-05-03 ENCOUNTER — Ambulatory Visit (INDEPENDENT_AMBULATORY_CARE_PROVIDER_SITE_OTHER): Payer: Medicare Other | Admitting: Primary Care

## 2020-05-03 ENCOUNTER — Encounter: Payer: Self-pay | Admitting: Primary Care

## 2020-05-03 VITALS — BP 144/82 | HR 68 | Temp 98.6°F | Ht 67.5 in | Wt 180.0 lb

## 2020-05-03 DIAGNOSIS — E039 Hypothyroidism, unspecified: Secondary | ICD-10-CM | POA: Diagnosis not present

## 2020-05-03 DIAGNOSIS — R002 Palpitations: Secondary | ICD-10-CM

## 2020-05-03 DIAGNOSIS — L659 Nonscarring hair loss, unspecified: Secondary | ICD-10-CM

## 2020-05-03 DIAGNOSIS — I10 Essential (primary) hypertension: Secondary | ICD-10-CM | POA: Diagnosis not present

## 2020-05-03 DIAGNOSIS — I480 Paroxysmal atrial fibrillation: Secondary | ICD-10-CM

## 2020-05-03 DIAGNOSIS — G8929 Other chronic pain: Secondary | ICD-10-CM

## 2020-05-03 DIAGNOSIS — M545 Low back pain, unspecified: Secondary | ICD-10-CM

## 2020-05-03 MED ORDER — AMLODIPINE BESYLATE 5 MG PO TABS: 5.0000 mg | ORAL_TABLET | Freq: Every day | ORAL | 0 refills | Status: DC

## 2020-05-03 NOTE — Assessment & Plan Note (Signed)
She is taking levothyroxine incorrectly as she is taking PPI and vitamins 30 min later. Discussed proper instructions.   Continue to monitor. Continue levothyroxine 88 mcg.

## 2020-05-03 NOTE — Assessment & Plan Note (Signed)
Resolved since cardiac ablation. Removing metoprolol succinate.

## 2020-05-03 NOTE — Assessment & Plan Note (Signed)
Over last 2-3 months.  Unclear cause, could be metoprolol succinate or Eliquis. Could also be stress reaction from cardiac issues in November/December 2021.  Could also be thyroid given incorrect administration of levothyroxine.   Start with discontinuation of metoprolol succinate. Also recommended she avoid hair dyes, blow drying hair, hot tools.  Follow up in 3 weeks.

## 2020-05-03 NOTE — Assessment & Plan Note (Signed)
Improved with gabapentin 300 mg HS, but this dose is causing second day drowsiness.  Recommended 100 mg at dinner and 200 mg HS. She has plenty of 100 mg and will try this and update.

## 2020-05-03 NOTE — Progress Notes (Signed)
Subjective:    Patient ID: Christie Wells, female    DOB: 11-Jan-1951, 70 y.o.   MRN: [id number]  HPI  This visit occurred during the SARS-CoV-2 public health emergency.  Safety protocols were in place, including screening questions prior to the visit, additional usage of staff PPE, and extensive cleaning of exam room while observing appropriate contact time as indicated for disinfecting solutions.   [name] is a 70 year old female with a history of atrial fibrillation, atrial flutter, hypothyroidism, chronic back pain who presents today to discuss hair thinning and blood pressure.   Currently managed on metoprolol succinate for tachycardia/palpitations, previously managed on losartan but this caused hair loss and "burnt tongue syndrome", symptoms improved after she stopped losartan. She is no longer on Amiodarone.   Over the last several months she's noticed thinning to her hair when blow drying her hair and also when at the hair dresser. She can see hair falling into the sink. She's also noticed her hair growth is slowing. She is compliant to her levothyroxine 88 mcg, she will take her Prevacid and vitamins 30 minutes later after taking.   She is checking her BP at home which is now running 140's-150's/80's-90's. BP was under much better control when on losartan but does not want to resume due to hair loss. She would like to discuss coming off metoprolol succinate as she doesn't feel that she needs it. She denies palpitations, dizziness, headaches since her cardiac ablation.   BP Readings from Last 3 Encounters:  05/03/20 (!) 144/82  03/04/20 134/62  01/16/20 134/68     Review of Systems  Eyes: Negative for visual disturbance.  Respiratory: Negative for shortness of breath.   Cardiovascular: Negative for chest pain.  Skin:       Hair thinning   Neurological: Negative for dizziness and headaches.       Past Medical History:  Diagnosis Date  . Arthritis   . Chronic headaches    . Colitis 1984  . GERD (gastroesophageal reflux disease)   . Hypothyroidism   . Migraines   . TIA (transient ischemic attack)   . UTI (urinary tract infection)      Social History   Socioeconomic History  . Marital status: Divorced    Spouse name: Not on file  . Number of children: 2  . Years of education: Not on file  . Highest education level: Not on file  Occupational History  . Not on file  Tobacco Use  . Smoking status: Never Smoker  . Smokeless tobacco: Never Used  Vaping Use  . Vaping Use: Never used  Substance and Sexual Activity  . Alcohol use: Yes    Alcohol/week: 1.0 standard drink    Types: 1 Shots of liquor per week  . Drug use: No  . Sexual activity: Not on file  Other Topics Concern  . Not on file  Social History Narrative   Divorced.   2 children.   Moved from [place].   Once worked as an Journalist, newspaper.       Social Determinants of Health   Financial Resource Strain: Not on file  Food Insecurity: Not on file  Transportation Needs: Not on file  Physical Activity: Not on file  Stress: Not on file  Social Connections: Not on file  Intimate Partner Violence: Not on file    Past Surgical History:  Procedure Laterality Date  . BUNIONECTOMY Bilateral   . CARDIAC ELECTROPHYSIOLOGY STUDY AND ABLATION  02/22/2020  .  COLONOSCOPY  2015  . EXCISION OF BREAST BIOPSY Left   . FOOT SURGERY Bilateral    5 screws   . TUBAL LIGATION    . UPPER GI ENDOSCOPY     Gastritis    Family History  Problem Relation Age of Onset  . Alzheimer's disease Mother   . Hypertension Mother   . Hyperlipidemia Mother   . COPD Mother   . Aneurysm Mother   . Stroke Father   . Hypertension Father   . Heart disease Maternal Grandmother   . Parkinson's disease Paternal Grandfather   . Aneurysm Brother   . Colon cancer Neg Hx   . Esophageal cancer Neg Hx     Allergies  Allergen Reactions  . Codeine Nausea And Vomiting    Other reaction(s): vomiting    . Peanut-Containing Drug Products     Headaches  . Sulfa Antibiotics Rash    Other reaction(s): rash    Current Outpatient Medications on File Prior to Visit  Medication Sig Dispense Refill  . apixaban (ELIQUIS) 5 MG TABS tablet Take 1 tablet (5 mg total) by mouth 2 (two) times daily. 180 tablet 1  . celecoxib (CELEBREX) 100 MG capsule Take 100 mg by mouth 2 (two) times daily.    . cholecalciferol (VITAMIN D) 1000 units tablet Take 3,000 Units by mouth daily.    . cyanocobalamin 1000 MCG tablet Take 1,000 mcg by mouth daily.    Marland Kitchen gabapentin (NEURONTIN) 300 MG capsule Take 1 capsule (300 mg total) by mouth at bedtime. For back pain. 90 capsule 0  . lansoprazole (PREVACID) 30 MG capsule Take 1 capsule (30 mg total) by mouth 2 (two) times daily before a meal. 180 capsule 1  . levothyroxine (SYNTHROID) 88 MCG tablet TAKE 1 TAB EVERY MORNING ON AN EMPTY STOMACH WITH WATER ONLY. NO FOOD OR OTHER MEDS FOR 30 MIN 90 tablet 1  . metoprolol succinate (TOPROL XL) 25 MG 24 hr tablet Take 1 tablet (25 mg total) by mouth daily. 90 tablet 2  . SUMAtriptan (IMITREX) 50 MG tablet TAKE 1 TABLET BY MOUTH AT MIGRAINE ONSET, MAY REPEAT WITH SECOND TABLET IN 2 HOURS IF MIGRAINE PERSISTS. 10 tablet 0   No current facility-administered medications on file prior to visit.    BP (!) 144/82   Pulse 68   Temp 98.6 F (37 C) (Temporal)   Ht 5' 7.5" (1.715 m)   Wt 180 lb (81.6 kg)   SpO2 98%   BMI 27.78 kg/m    Objective:   Physical Exam Constitutional:      Appearance: She is well-nourished.  Cardiovascular:     Rate and Rhythm: Normal rate and regular rhythm.  Pulmonary:     Effort: Pulmonary effort is normal.     Breath sounds: Normal breath sounds.  Musculoskeletal:     Cervical back: Neck supple.  Skin:    General: Skin is warm and dry.     Comments: Thinning to hair noted, no patches of hair missing.   Psychiatric:        Mood and Affect: Mood and affect and mood normal.             Assessment & Plan:

## 2020-05-03 NOTE — Patient Instructions (Addendum)
Be sure to take your levothyroxine (thyroid medication) every morning on an empty stomach with water only. No food or other medications for 30 minutes. No heartburn medication, iron pills, calcium, vitamin D, or magnesium pills within four hours of taking levothyroxine.   Stop metoprolol succinate 25 mg.  Start amlodipine 5 mg once daily for blood pressure.  Monitor your blood pressure and heart rate.   Please schedule a follow up visit to meet back with me in 2-3 weeks for blood pressure check.   It was a pleasure to see you today!

## 2020-05-03 NOTE — Assessment & Plan Note (Signed)
Above goal today, also with home readings. Given history would like better control.  Metoprolol succinate is not contributing to BP, will discontinue given lack of symptoms.   Start amlodipine 5 mg daily. Will not prescribe losartan given hair loss.   We will see her back in 2-3 weeks for BP check.

## 2020-05-03 NOTE — Assessment & Plan Note (Signed)
Rate and rhythm regular, history of cardiac ablation in December 2021.  Will discontinue metoprolol succinate per patient request, especially given absence of tachycardia and symptoms.

## 2020-05-21 ENCOUNTER — Other Ambulatory Visit: Payer: Self-pay | Admitting: Primary Care

## 2020-05-27 ENCOUNTER — Telehealth: Payer: Self-pay | Admitting: Primary Care

## 2020-05-27 DIAGNOSIS — I10 Essential (primary) hypertension: Secondary | ICD-10-CM

## 2020-05-27 NOTE — Telephone Encounter (Signed)
Pt left v/m that pt requesting amlodipine; pt had appt scheduled for 05/30/20 and [facility] called and changed pt appt until 06/04/20. Pt will be out of pills by end of wk. Pt request refill until appt on 06/04/20. Pt request cb because pt does not think she is supposed to be out of med.

## 2020-05-27 NOTE — Telephone Encounter (Signed)
Pt called and said to disregard previous message. She got the dates mixed up. She was actually rescheduled from 3/22 to 3/17, so she will be okay on her medication until she is seen.

## 2020-05-28 NOTE — Telephone Encounter (Signed)
Noted  

## 2020-05-30 ENCOUNTER — Ambulatory Visit: Payer: Medicare Other | Admitting: Primary Care

## 2020-05-30 ENCOUNTER — Encounter: Payer: Self-pay | Admitting: Primary Care

## 2020-05-30 ENCOUNTER — Other Ambulatory Visit: Payer: Self-pay

## 2020-05-30 ENCOUNTER — Ambulatory Visit (INDEPENDENT_AMBULATORY_CARE_PROVIDER_SITE_OTHER): Payer: Medicare Other | Admitting: Primary Care

## 2020-05-30 DIAGNOSIS — I480 Paroxysmal atrial fibrillation: Secondary | ICD-10-CM | POA: Diagnosis not present

## 2020-05-30 DIAGNOSIS — I10 Essential (primary) hypertension: Secondary | ICD-10-CM

## 2020-05-30 DIAGNOSIS — E039 Hypothyroidism, unspecified: Secondary | ICD-10-CM | POA: Diagnosis not present

## 2020-05-30 MED ORDER — AMLODIPINE BESYLATE 5 MG PO TABS: 5.0000 mg | ORAL_TABLET | Freq: Every day | ORAL | 0 refills | Status: DC

## 2020-05-30 MED ORDER — APIXABAN 5 MG PO TABS: 5.0000 mg | ORAL_TABLET | Freq: Two times a day (BID) | ORAL | 3 refills | Status: DC

## 2020-05-30 NOTE — Progress Notes (Signed)
Subjective:    Patient ID: Christie Wells, female    DOB: May 29, 1950, 70 y.o.   MRN: [id number]  HPI  Christie Wells is a very pleasant 70 y.o. female with a history of hypertension, atrial flutter, migraines, hypothyroidism, chronic back pain who presents today for follow-up of hypertension  She was last evaluated 1 month ago, blood pressure was noted to be above goal, also with home readings.  She was on metoprolol succinate, however we discussed how this was not likely helping with lowering of her blood pressure so we initiated amlodipine 5 mg.  She is here today for follow-up.  Since her last visit she has noticed an increase in her bowel movements and is experiencing movements twice daily. She's had two migraines. also with dry mouth at night. She has noticed less hair loss, believes this is improving. She's noticed less soreness to her tongue which does remain about 60% of the time. She's noticing a "pounding" sensation to her heart at night when initially getting into bed. She is getting up to urinate during the night (not new), has noticed dizziness when getting up to urinate.   She is checking her BP at home which is running 110's/70's-90's.   She is having difficulty separating her PPI four hours from levothyroxine, as she has to take PPI before she eats. Cannot wait four hours prior to eating.   BP Readings from Last 3 Encounters:  05/30/20 126/72  05/03/20 (!) 144/82  03/04/20 134/62      Review of Systems  Eyes: Negative for visual disturbance.  Respiratory: Negative for shortness of breath.   Cardiovascular: Positive for palpitations. Negative for chest pain.  Neurological: Positive for dizziness. Negative for headaches.         Past Medical History:  Diagnosis Date  . Arthritis   . Chronic headaches   . Colitis 1984  . GERD (gastroesophageal reflux disease)   . Hypothyroidism   . Migraines   . TIA (transient ischemic attack)   . UTI (urinary tract  infection)     Social History   Socioeconomic History  . Marital status: Divorced    Spouse name: Not on file  . Number of children: 2  . Years of education: Not on file  . Highest education level: Not on file  Occupational History  . Not on file  Tobacco Use  . Smoking status: Never Smoker  . Smokeless tobacco: Never Used  Vaping Use  . Vaping Use: Never used  Substance and Sexual Activity  . Alcohol use: Yes    Alcohol/week: 1.0 standard drink    Types: 1 Shots of liquor per week  . Drug use: No  . Sexual activity: Not on file  Other Topics Concern  . Not on file  Social History Narrative   Divorced.   2 children.   Moved from [place].   Once worked as an Journalist, newspaper.       Social Determinants of Health   Financial Resource Strain: Not on file  Food Insecurity: Not on file  Transportation Needs: Not on file  Physical Activity: Not on file  Stress: Not on file  Social Connections: Not on file  Intimate Partner Violence: Not on file    Past Surgical History:  Procedure Laterality Date  . BUNIONECTOMY Bilateral   . CARDIAC ELECTROPHYSIOLOGY STUDY AND ABLATION  02/22/2020  . COLONOSCOPY  2015  . EXCISION OF BREAST BIOPSY Left   . FOOT SURGERY Bilateral  5 screws   . TUBAL LIGATION    . UPPER GI ENDOSCOPY     Gastritis    Family History  Problem Relation Age of Onset  . Alzheimer's disease Mother   . Hypertension Mother   . Hyperlipidemia Mother   . COPD Mother   . Aneurysm Mother   . Stroke Father   . Hypertension Father   . Heart disease Maternal Grandmother   . Parkinson's disease Paternal Grandfather   . Aneurysm Brother   . Colon cancer Neg Hx   . Esophageal cancer Neg Hx     Allergies  Allergen Reactions  . Codeine Nausea And Vomiting    Other reaction(s): vomiting   . Peanut-Containing Drug Products     Headaches  . Sulfa Antibiotics Rash    Other reaction(s): rash    Current Outpatient Medications on File Prior to  Visit  Medication Sig Dispense Refill  . amLODipine (NORVASC) 5 MG tablet Take 1 tablet (5 mg total) by mouth daily. For blood pressure. 30 tablet 0  . apixaban (ELIQUIS) 5 MG TABS tablet Take 1 tablet (5 mg total) by mouth 2 (two) times daily. 180 tablet 1  . celecoxib (CELEBREX) 100 MG capsule Take 100 mg by mouth 2 (two) times daily.    . cholecalciferol (VITAMIN D) 1000 units tablet Take 3,000 Units by mouth daily.    . cyanocobalamin 1000 MCG tablet Take 1,000 mcg by mouth daily.    Marland Kitchen gabapentin (NEURONTIN) 300 MG capsule Take 1 capsule (300 mg total) by mouth at bedtime. For back pain. 90 capsule 0  . lansoprazole (PREVACID) 30 MG capsule Take 1 capsule (30 mg total) by mouth 2 (two) times daily before a meal. 180 capsule 1  . levothyroxine (SYNTHROID) 88 MCG tablet TAKE 1 TAB EVERY MORNING ON AN EMPTY STOMACH WITH WATER ONLY. NO FOOD OR OTHER MEDS FOR 30 MIN 90 tablet 1  . SUMAtriptan (IMITREX) 50 MG tablet TAKE 1 TABLET BY MOUTH AT MIGRAINE ONSET, MAY REPEAT WITH SECOND TABLET IN 2 HOURS IF MIGRAINE PERSISTS. 10 tablet 0   No current facility-administered medications on file prior to visit.    BP 126/72   Pulse 75   Temp 98.3 F (36.8 C) (Temporal)   Ht 5' 7.5" (1.715 m)   Wt 178 lb (80.7 kg)   SpO2 97%   BMI 27.47 kg/m  Objective:   Physical Exam Cardiovascular:     Rate and Rhythm: Normal rate and regular rhythm.  Pulmonary:     Effort: Pulmonary effort is normal.     Breath sounds: Normal breath sounds.  Musculoskeletal:     Cervical back: Neck supple.  Skin:    General: Skin is warm and dry.           Assessment & Plan:      This visit occurred during the SARS-CoV-2 public health emergency.  Safety protocols were in place, including screening questions prior to the visit, additional usage of staff PPE, and extensive cleaning of exam room while observing appropriate contact time as indicated for disinfecting solutions.

## 2020-05-30 NOTE — Assessment & Plan Note (Signed)
Difficult time with administration of levothyroxine and PPI.  She will try waiting at least 1 hour between levothyroxine and lansoprazole.  Given this we will closely monitor thyroid function, repeat in 2 months.  She will move her vitamins to the evening hours.

## 2020-05-30 NOTE — Patient Instructions (Signed)
Move your amlodipine blood pressure medication to the morning hours as discussed.  Try to separate your levothyroxine at least 1 hour from lansoprazole.  Move your vitamins to the evening hours as discussed.  Set up a lab appointment to repeat your thyroid function in 2 months.  It was a pleasure to see you today!

## 2020-05-30 NOTE — Assessment & Plan Note (Signed)
Refills for Eliquis provided.

## 2020-05-30 NOTE — Assessment & Plan Note (Signed)
Well controlled in the office today on amlodipine 5 mg. Discussed to move amlodipine 5 mg to AM as she's likely experiencing some orthostasis during the night going to the bathroom.   Refill provided to pharmacy, she will update in another few weeks with her decision to remain on amlodipine given some of the potential side effects.

## 2020-06-04 ENCOUNTER — Other Ambulatory Visit: Payer: Medicare Other

## 2020-06-05 ENCOUNTER — Telehealth: Payer: Self-pay

## 2020-06-05 NOTE — Telephone Encounter (Signed)
Pt left v/m that for at least 4 days pt has had S/T, prod cough and pt not sure of color of phlegm, a lot of chest congestion, and 06/04/20 pt could not get her breath; one time was in car and felt like was going to pass out but pass did not pass out; the 2nd time could not breath was when pt woke up last night and has a lot of chest congestion; pt sat up the rest of the night.No fever. SOB on and off.last time could not get breath was last night. No CP or tightness. Pt will go to [facility] because it is close to where pt lives. UC & ED precautions given and pt voiced understanding. Sending note to Gentry Fitz NP as Juluis Rainier.

## 2020-06-05 NOTE — Telephone Encounter (Signed)
I do not see where patient presented to urgent care, how she doing?  She likely needs to be seen soon.

## 2020-06-06 NOTE — Telephone Encounter (Signed)
Called patient was walking out of UC when I called. She was evaluated and started on abx with cough.

## 2020-06-06 NOTE — Telephone Encounter (Signed)
Noted  

## 2020-06-07 ENCOUNTER — Telehealth: Payer: Self-pay

## 2020-06-07 NOTE — Telephone Encounter (Signed)
Sure, she can try Mucinex DM which will help with congestion and cough.  Make sure she's drinking enough water to help thin mucous.

## 2020-06-07 NOTE — Telephone Encounter (Signed)
Please advise 

## 2020-06-07 NOTE — Telephone Encounter (Signed)
Called patient gave information. She also wanted to know if she could/ should take the prescription cough meds. I have checked with Anda Kraft and patient informed she can take both.

## 2020-06-07 NOTE — Telephone Encounter (Signed)
Pt left v/m that she was seen at [facility] on 06/06/20; pt was negative for flu, strep and covid. Pt was given promethazine DM 6.25-15 mg/ 5 ml syrup and flonase and augmentin 875-125 mg. Pt said yesterday had dry cough and was given cough suppressant but to day pt has a more congested cough and does not want to take a cough suppressant so can get mucus out of chest but pt cannot stop coughing and wants to know if should get OTC expectorant or what should pt do. Pt does not want to go back to walk in urgent care because she cannot wait over 2 hrs again. Pt request cb. [facility]. [facility] note is in pts chart review section.

## 2020-06-23 DIAGNOSIS — I10 Essential (primary) hypertension: Secondary | ICD-10-CM

## 2020-06-25 MED ORDER — AMLODIPINE BESYLATE 2.5 MG PO TABS: 2.5000 mg | ORAL_TABLET | Freq: Two times a day (BID) | ORAL | 0 refills | Status: DC

## 2020-06-29 DIAGNOSIS — F419 Anxiety disorder, unspecified: Secondary | ICD-10-CM

## 2020-06-30 ENCOUNTER — Other Ambulatory Visit: Payer: Self-pay | Admitting: Primary Care

## 2020-06-30 DIAGNOSIS — E039 Hypothyroidism, unspecified: Secondary | ICD-10-CM

## 2020-07-02 ENCOUNTER — Telehealth: Payer: Self-pay

## 2020-07-02 MED ORDER — HYDROXYZINE HCL 10 MG PO TABS: 10.0000 mg | ORAL_TABLET | Freq: Two times a day (BID) | ORAL | 0 refills | Status: DC | PRN

## 2020-07-02 NOTE — Telephone Encounter (Signed)
[facility] [place]: [facility] - [place] Case ID: [id number] - Rx #: [id number] Need help? Call us at [phone] Status Sent to [facility] hydrOXYzine HCl 10MG  tablets Form [employer] Electronic PA Form (2017 NCPDP) Original Claim Info [phone] IF LEVEL OF CARE CHANGE CALL HELP DESK

## 2020-07-09 ENCOUNTER — Encounter: Payer: Self-pay | Admitting: Primary Care

## 2020-07-09 ENCOUNTER — Other Ambulatory Visit: Payer: Self-pay

## 2020-07-09 ENCOUNTER — Ambulatory Visit (INDEPENDENT_AMBULATORY_CARE_PROVIDER_SITE_OTHER): Payer: Medicare Other | Admitting: Primary Care

## 2020-07-09 VITALS — BP 118/74 | HR 63 | Temp 98.7°F | Ht 65.7 in | Wt 180.0 lb

## 2020-07-09 DIAGNOSIS — K649 Unspecified hemorrhoids: Secondary | ICD-10-CM

## 2020-07-09 LAB — HEMOCCULT GUIAC POC 1CARD (OFFICE): Fecal Occult Blood, POC: NEGATIVE

## 2020-07-09 MED ORDER — HYDROCORTISONE (PERIANAL) 2.5 % EX CREA: 1.0000 "application " | TOPICAL_CREAM | Freq: Two times a day (BID) | CUTANEOUS | 0 refills | Status: DC

## 2020-07-09 NOTE — Patient Instructions (Addendum)
Apply the hydrocortisone 2.5% rectal cream twice daily for about one week.  Please do notify me if you experience recurrent flares.   It was a pleasure to see you today!   Hemorrhoids Hemorrhoids are swollen veins in and around the rectum or anus. There are two types of hemorrhoids:  Internal hemorrhoids. These occur in the veins that are just inside the rectum. They may poke through to the outside and become irritated and painful.  External hemorrhoids. These occur in the veins that are outside the anus and can be felt as a painful swelling or hard lump near the anus. Most hemorrhoids do not cause serious problems, and they can be managed with home treatments such as diet and lifestyle changes. If home treatments do not help the symptoms, procedures can be done to shrink or remove the hemorrhoids. What are the causes? This condition is caused by increased pressure in the anal area. This pressure may result from various things, including:  Constipation.  Straining to have a bowel movement.  Diarrhea.  Pregnancy.  Obesity.  Sitting for long periods of time.  Heavy lifting or other activity that causes you to strain.  Anal sex.  Riding a bike for a long period of time. What are the signs or symptoms? Symptoms of this condition include:  Pain.  Anal itching or irritation.  Rectal bleeding.  Leakage of stool (feces).  Anal swelling.  One or more lumps around the anus. How is this diagnosed? This condition can often be diagnosed through a visual exam. Other exams or tests may also be done, such as:  An exam that involves feeling the rectal area with a gloved hand (digital rectal exam).  An exam of the anal canal that is done using a small tube (anoscope).  A blood test, if you have lost a significant amount of blood.  A test to look inside the colon using a flexible tube with a camera on the end (sigmoidoscopy or colonoscopy). How is this treated? This condition  can usually be treated at home. However, various procedures may be done if dietary changes, lifestyle changes, and other home treatments do not help your symptoms. These procedures can help make the hemorrhoids smaller or remove them completely. Some of these procedures involve surgery, and others do not. Common procedures include:  Rubber band ligation. Rubber bands are placed at the base of the hemorrhoids to cut off their blood supply.  Sclerotherapy. Medicine is injected into the hemorrhoids to shrink them.  Infrared coagulation. A type of light energy is used to get rid of the hemorrhoids.  Hemorrhoidectomy surgery. The hemorrhoids are surgically removed, and the veins that supply them are tied off.  Stapled hemorrhoidopexy surgery. The surgeon staples the base of the hemorrhoid to the rectal wall. Follow these instructions at home: Eating and drinking  Eat foods that have a lot of fiber in them, such as whole grains, beans, nuts, fruits, and vegetables.  Ask your health care provider about taking products that have added fiber (fiber supplements).  Reduce the amount of fat in your diet. You can do this by eating low-fat dairy products, eating less red meat, and avoiding processed foods.  Drink enough fluid to keep your urine pale yellow.   Managing pain and swelling  Take warm sitz baths for 20 minutes, 3-4 times a day to ease pain and discomfort. You may do this in a bathtub or using a portable sitz bath that fits over the toilet.  If directed,  apply ice to the affected area. Using ice packs between sitz baths may be helpful. ? Put ice in a plastic bag. ? Place a towel between your skin and the bag. ? Leave the ice on for 20 minutes, 2-3 times a day.   General instructions  Take over-the-counter and prescription medicines only as told by your health care provider.  Use medicated creams or suppositories as told.  Get regular exercise. Ask your health care provider how much  and what kind of exercise is best for you. In general, you should do moderate exercise for at least 30 minutes on most days of the week (150 minutes each week). This can include activities such as walking, biking, or yoga.  Go to the bathroom when you have the urge to have a bowel movement. Do not wait.  Avoid straining to have bowel movements.  Keep the anal area dry and clean. Use wet toilet paper or moist towelettes after a bowel movement.  Do not sit on the toilet for long periods of time. This increases blood pooling and pain.  Keep all follow-up visits as told by your health care provider. This is important. Contact a health care provider if you have:  Increasing pain and swelling that are not controlled by treatment or medicine.  Difficulty having a bowel movement, or you are unable to have a bowel movement.  Pain or inflammation outside the area of the hemorrhoids. Get help right away if you have:  Uncontrolled bleeding from your rectum. Summary  Hemorrhoids are swollen veins in and around the rectum or anus.  Most hemorrhoids can be managed with home treatments such as diet and lifestyle changes.  Taking warm sitz baths can help ease pain and discomfort.  In severe cases, procedures or surgery can be done to shrink or remove the hemorrhoids. This information is not intended to replace advice given to you by your health care provider. Make sure you discuss any questions you have with your health care provider. Document Revised: 07/29/2018 Document Reviewed: 07/22/2017 Elsevier Patient Education  [place].

## 2020-07-09 NOTE — Progress Notes (Signed)
Subjective:    Patient ID: Christie Wells, female    DOB: [birth year], 70 y.o.   MRN: [id number]  HPI  Christie Wells is a very pleasant 70 y.o. female with a history of atrial flutter, paroxysmal atrial fibrillation (on Eliquis), diarrhea, GERD who presents today with a chief complaint of rectal bleeding.  She has a history of external and internal hemorrhoids since younger years. Typically she will push in her hemorrhoids. She was recently sick with a "cold" including symptoms of diarrhea.  After her cold resolved she experienced constipation and firm stools with bright red bleeding with bowel movements on wiping, this lasted a few days. Over the last several days her stools have softened with mucus. She denies bleeding for the last several days.  She is leaving for [place] in 10 days and is concerned about her symptoms. She's not tried OTC treatment.    BP Readings from Last 3 Encounters:  07/09/20 118/74  05/30/20 126/72  05/03/20 (!) 144/82      Review of Systems  Constitutional: Negative for fatigue.  Gastrointestinal: Positive for blood in stool.  Neurological: Negative for dizziness.         Past Medical History:  Diagnosis Date  . Arthritis   . Chronic headaches   . Colitis 1984  . GERD (gastroesophageal reflux disease)   . Hypothyroidism   . Migraines   . TIA (transient ischemic attack)   . UTI (urinary tract infection)     Social History   Socioeconomic History  . Marital status: Divorced    Spouse name: Not on file  . Number of children: 2  . Years of education: Not on file  . Highest education level: Not on file  Occupational History  . Not on file  Tobacco Use  . Smoking status: Never Smoker  . Smokeless tobacco: Never Used  Vaping Use  . Vaping Use: Never used  Substance and Sexual Activity  . Alcohol use: Yes    Alcohol/week: 1.0 standard drink    Types: 1 Shots of liquor per week  . Drug use: No  . Sexual activity: Not on file  Other  Topics Concern  . Not on file  Social History Narrative   Divorced.   2 children.   Moved from [place].   Once worked as an Journalist, newspaper.       Social Determinants of Health   Financial Resource Strain: Not on file  Food Insecurity: Not on file  Transportation Needs: Not on file  Physical Activity: Not on file  Stress: Not on file  Social Connections: Not on file  Intimate Partner Violence: Not on file    Past Surgical History:  Procedure Laterality Date  . BUNIONECTOMY Bilateral   . CARDIAC ELECTROPHYSIOLOGY STUDY AND ABLATION  02/22/2020  . COLONOSCOPY  2015  . EXCISION OF BREAST BIOPSY Left   . FOOT SURGERY Bilateral    5 screws   . TUBAL LIGATION    . UPPER GI ENDOSCOPY     Gastritis    Family History  Problem Relation Age of Onset  . Alzheimer's disease Mother   . Hypertension Mother   . Hyperlipidemia Mother   . COPD Mother   . Aneurysm Mother   . Stroke Father   . Hypertension Father   . Heart disease Maternal Grandmother   . Parkinson's disease Paternal Grandfather   . Aneurysm Brother   . Colon cancer Neg Hx   . Esophageal cancer Neg Hx  Allergies  Allergen Reactions  . Codeine Nausea And Vomiting    Other reaction(s): vomiting   . Peanut-Containing Drug Products     Headaches  . Sulfa Antibiotics Rash    Other reaction(s): rash    Current Outpatient Medications on File Prior to Visit  Medication Sig Dispense Refill  . amLODipine (NORVASC) 2.5 MG tablet Take 1 tablet (2.5 mg total) by mouth in the morning and at bedtime. For blood pressure. 90 tablet 0  . apixaban (ELIQUIS) 5 MG TABS tablet Take 1 tablet (5 mg total) by mouth 2 (two) times daily. 180 tablet 3  . celecoxib (CELEBREX) 100 MG capsule Take 100 mg by mouth 2 (two) times daily.    . cholecalciferol (VITAMIN D) 1000 units tablet Take 3,000 Units by mouth daily.    . cyanocobalamin 1000 MCG tablet Take 1,000 mcg by mouth daily.    Marland Kitchen gabapentin (NEURONTIN) 300 MG  capsule Take 1 capsule (300 mg total) by mouth at bedtime. For back pain. 90 capsule 0  . hydrOXYzine (ATARAX/VISTARIL) 10 MG tablet Take 1-2 tablets (10-20 mg total) by mouth 2 (two) times daily as needed for anxiety. 30 tablet 0  . lansoprazole (PREVACID) 30 MG capsule Take 1 capsule (30 mg total) by mouth 2 (two) times daily before a meal. 180 capsule 1  . levothyroxine (SYNTHROID) 88 MCG tablet TAKE 1 TAB EVERY MORNING ON AN EMPTY STOMACH WITH WATER ONLY. NO FOOD OR OTHER MEDS FOR 30 MIN 90 tablet 3  . SUMAtriptan (IMITREX) 50 MG tablet TAKE 1 TABLET BY MOUTH AT MIGRAINE ONSET, MAY REPEAT WITH SECOND TABLET IN 2 HOURS IF MIGRAINE PERSISTS. 10 tablet 0   No current facility-administered medications on file prior to visit.    BP 118/74   Pulse 63   Temp 98.7 F (37.1 C) (Temporal)   Ht 5' 5.7" (1.669 m)   Wt 180 lb (81.6 kg)   SpO2 96%   BMI 29.32 kg/m  Objective:   Physical Exam Constitutional:      General: She is not in acute distress.    Appearance: She is not ill-appearing.  Pulmonary:     Effort: Pulmonary effort is normal.  Genitourinary:    Rectum: Guaiac result negative. External hemorrhoid present.  Neurological:     Mental Status: She is alert.           Assessment & Plan:      This visit occurred during the SARS-CoV-2 public health emergency.  Safety protocols were in place, including screening questions prior to the visit, additional usage of staff PPE, and extensive cleaning of exam room while observing appropriate contact time as indicated for disinfecting solutions.

## 2020-07-09 NOTE — Assessment & Plan Note (Signed)
Several external hemorrhoids noted on exam. Non thrombosed. Negative stool card today.  Hydrocortisone rectal cream sent to pharmacy, discussed instructions for use.  Overall she's feeling much better. Discussed option for GI consultation to have hemorrhoids removed. She will update.

## 2020-07-22 ENCOUNTER — Other Ambulatory Visit: Payer: Self-pay | Admitting: Primary Care

## 2020-07-22 DIAGNOSIS — E039 Hypothyroidism, unspecified: Secondary | ICD-10-CM

## 2020-07-24 ENCOUNTER — Other Ambulatory Visit: Payer: Self-pay | Admitting: Primary Care

## 2020-07-24 DIAGNOSIS — F419 Anxiety disorder, unspecified: Secondary | ICD-10-CM

## 2020-07-29 DIAGNOSIS — G8929 Other chronic pain: Secondary | ICD-10-CM

## 2020-07-29 DIAGNOSIS — M545 Low back pain, unspecified: Secondary | ICD-10-CM

## 2020-07-30 MED ORDER — GABAPENTIN 100 MG PO CAPS: 200.0000 mg | ORAL_CAPSULE | Freq: Every day | ORAL | 1 refills | Status: DC

## 2020-08-06 ENCOUNTER — Other Ambulatory Visit: Payer: Self-pay

## 2020-08-06 ENCOUNTER — Other Ambulatory Visit: Payer: Medicare Other

## 2020-08-06 ENCOUNTER — Other Ambulatory Visit (INDEPENDENT_AMBULATORY_CARE_PROVIDER_SITE_OTHER): Payer: Medicare Other

## 2020-08-06 DIAGNOSIS — E039 Hypothyroidism, unspecified: Secondary | ICD-10-CM | POA: Diagnosis not present

## 2020-08-07 LAB — TSH: TSH: 1.12 u[IU]/mL (ref 0.35–4.50)

## 2020-08-13 ENCOUNTER — Other Ambulatory Visit: Payer: Self-pay | Admitting: Primary Care

## 2020-08-13 DIAGNOSIS — I10 Essential (primary) hypertension: Secondary | ICD-10-CM

## 2020-08-20 ENCOUNTER — Ambulatory Visit: Admit: 2020-08-20 | Payer: Medicare Other | Admitting: Ophthalmology

## 2020-08-20 SURGERY — PHACOEMULSIFICATION, CATARACT, WITH IOL INSERTION
Anesthesia: Topical | Laterality: Left

## 2020-08-23 ENCOUNTER — Other Ambulatory Visit: Payer: Self-pay | Admitting: Primary Care

## 2020-08-23 DIAGNOSIS — G43101 Migraine with aura, not intractable, with status migrainosus: Secondary | ICD-10-CM

## 2020-08-26 ENCOUNTER — Other Ambulatory Visit: Payer: Self-pay | Admitting: Primary Care

## 2020-08-26 DIAGNOSIS — K219 Gastro-esophageal reflux disease without esophagitis: Secondary | ICD-10-CM

## 2020-09-03 ENCOUNTER — Ambulatory Visit: Admit: 2020-09-03 | Payer: Medicare Other | Admitting: Ophthalmology

## 2020-09-03 SURGERY — PHACOEMULSIFICATION, CATARACT, WITH IOL INSERTION
Anesthesia: Topical | Laterality: Right

## 2020-09-19 ENCOUNTER — Other Ambulatory Visit: Payer: Self-pay | Admitting: Primary Care

## 2020-09-19 DIAGNOSIS — G43101 Migraine with aura, not intractable, with status migrainosus: Secondary | ICD-10-CM

## 2020-09-19 DIAGNOSIS — I10 Essential (primary) hypertension: Secondary | ICD-10-CM

## 2020-09-22 ENCOUNTER — Other Ambulatory Visit: Payer: Self-pay | Admitting: Primary Care

## 2020-09-22 DIAGNOSIS — G43109 Migraine with aura, not intractable, without status migrainosus: Secondary | ICD-10-CM

## 2020-09-22 DIAGNOSIS — G43101 Migraine with aura, not intractable, with status migrainosus: Secondary | ICD-10-CM

## 2020-09-22 DIAGNOSIS — M79673 Pain in unspecified foot: Secondary | ICD-10-CM

## 2020-09-22 DIAGNOSIS — G8929 Other chronic pain: Secondary | ICD-10-CM

## 2020-09-22 DIAGNOSIS — M545 Low back pain, unspecified: Secondary | ICD-10-CM

## 2020-10-02 ENCOUNTER — Other Ambulatory Visit: Payer: Self-pay | Admitting: Primary Care

## 2020-10-02 DIAGNOSIS — G43101 Migraine with aura, not intractable, with status migrainosus: Secondary | ICD-10-CM

## 2020-10-02 NOTE — Telephone Encounter (Signed)
Received refill for sumatriptan for migraines, does she actually need a refill or was this on autofill?  If not, then how often is she taking this? We gave her a refill in June 2022.

## 2020-10-02 NOTE — Telephone Encounter (Signed)
Christie Wells called in and stated that she only gets 9 pills a month and she is out of the medication. And she is leaving to go out of town on Friday

## 2020-10-03 NOTE — Telephone Encounter (Signed)
Pt called to check status on refill. Please advise  °

## 2020-10-04 NOTE — Telephone Encounter (Signed)
I called and spoke with patient and she endorses increased frequency of migraines over the last 6 weeks.  She has 5 tablets on hand currently but wanted an extra refill to have on file.  We discussed that recurrent use of sumatriptan is not best practice for migraine treatment.  She is leaving town tomorrow but will set up an appointment to see me when she returns.  Refill sent to pharmacy.

## 2020-10-10 ENCOUNTER — Telehealth: Payer: Self-pay

## 2020-10-10 ENCOUNTER — Encounter: Payer: Self-pay | Admitting: Primary Care

## 2020-10-10 ENCOUNTER — Other Ambulatory Visit: Payer: Self-pay

## 2020-10-10 ENCOUNTER — Telehealth (INDEPENDENT_AMBULATORY_CARE_PROVIDER_SITE_OTHER): Payer: Medicare Other | Admitting: Primary Care

## 2020-10-10 VITALS — Temp 97.8°F | Ht 65.7 in | Wt 175.0 lb

## 2020-10-10 DIAGNOSIS — U071 COVID-19: Secondary | ICD-10-CM

## 2020-10-10 HISTORY — DX: COVID-19: U07.1

## 2020-10-10 MED ORDER — MOLNUPIRAVIR EUA 200MG CAPSULE: 4.0000 | ORAL_CAPSULE | Freq: Two times a day (BID) | ORAL | 0 refills | Status: AC

## 2020-10-10 NOTE — Patient Instructions (Signed)
Take the molnupiravir 200 mg capsules for Covid infection.  Take 4 capsules twice daily for five days.  It was a pleasure to see you today!

## 2020-10-10 NOTE — Telephone Encounter (Signed)
Vm from pt stating she tested pos for COVID after returning from [place] trip.  C/o fever- 99.9, cough and cold sxs.  Had to sit up to sleep last night.  States she took Coricidin HBP Cold & Flu and Tylenol for fever.  Meds helped her sleep a little.  Pt is asking if there is something else she can take.    Spoke with pt asking about sxs.  Says it feels like cold sxs.  C/o cough, fever- max 99.9, nasal congestion, scratchy throat and HA.  Sxs started 10/08/20.   Sxs started 10/08/20.  Pos home COVID test on 10/09/20.  2nd [facility] booster on 07/02/20.  Wants to discuss other med options.   Scheduled MyChart video visit today at 12:00 w/ Anda Kraft.  FYI to [place].

## 2020-10-10 NOTE — Telephone Encounter (Signed)
Noted, will evaluate. 

## 2020-10-10 NOTE — Progress Notes (Signed)
Patient ID: Christie Wells, female    DOB: [birth year], 70 y.o.   MRN: [id number]  Virtual visit completed through [employer], a video enabled telemedicine application. Due to national recommendations of social distancing due to COVID-19, a virtual visit is felt to be most appropriate for this patient at this time. Reviewed limitations, risks, security and privacy concerns of performing a virtual visit and the availability of in person appointments. I also reviewed that there may be a patient responsible charge related to this service. The patient agreed to proceed.   Patient location: home Provider location: [place] at [facility], office Persons participating in this virtual visit: patient, provider   If any vitals were documented, they were collected by patient at home unless specified below.    Temp 97.8 F (36.6 C)   Ht 5' 5.7" (1.669 m)   Wt 175 lb (79.4 kg)   BMI 28.50 kg/m    CC: Positive Covid Subjective:   HPI: Christie Wells is a 70 y.o. female with a history of hypertension, paroxysmal atrial fibrillation, hypothyroidism presenting on 10/10/2020 for positive Covid.   Symptoms include cough, nasal congestion, headache, fevers that began two evenings ago with throat drainage. Her temperatures are running in the 99 range.   She returned from [place] [place] two days ago, went with her daughter and grandson. Her daughter had cough/cold symptoms several days prior to her symptom onset. She's taken two Covid-19 tests and has tested positive, last test was last night.   She's been taking Coricidin, Cold and Flu with temporary improvement. She's also taking Tylenol. She's completed four Covid-19 vaccines.        Relevant past medical, surgical, family and social history reviewed and updated as indicated. Interim medical history since our last visit reviewed. Allergies and medications reviewed and updated. Outpatient Medications Prior to Visit  Medication Sig Dispense Refill    amLODipine (NORVASC) 2.5 MG tablet TAKE 1 TABLET (2.5 MG TOTAL) BY MOUTH IN THE MORNING AND AT BEDTIME. FOR BLOOD PRESSURE. 90 tablet 0   celecoxib (CELEBREX) 100 MG capsule Take 100 mg by mouth 2 (two) times daily.     cholecalciferol (VITAMIN D) 1000 units tablet Take 3,000 Units by mouth daily.     cyanocobalamin 1000 MCG tablet Take 1,000 mcg by mouth daily.     gabapentin (NEURONTIN) 100 MG capsule Take 2 capsules (200 mg total) by mouth at bedtime. For back pain. 180 capsule 1   hydrocortisone (ANUSOL-HC) 2.5 % rectal cream Place 1 application rectally 2 (two) times daily. For hemorrhoids 30 g 0   hydrOXYzine (ATARAX/VISTARIL) 10 MG tablet TAKE 1-2 TABLETS (10-20 MG TOTAL) BY MOUTH 2 (TWO) TIMES DAILY AS NEEDED FOR ANXIETY. 90 tablet 0   lansoprazole (PREVACID) 30 MG capsule Take 1 capsule (30 mg total) by mouth 2 (two) times daily before a meal. For heartburn. 180 capsule 1   levothyroxine (SYNTHROID) 88 MCG tablet TAKE 1 TAB EVERY MORNING ON AN EMPTY STOMACH WITH WATER ONLY. NO FOOD OR OTHER MEDS FOR 30 MIN 90 tablet 3   SUMAtriptan (IMITREX) 50 MG tablet 1 TABLET BY MOUTH AT MIGRAINE ONSET MAY REPEAT WITH SECOND TABLET IN 2 HOURS IF MIGRAINE PERSIST 10 tablet 0   apixaban (ELIQUIS) 5 MG TABS tablet Take 1 tablet (5 mg total) by mouth 2 (two) times daily. 180 tablet 3   No facility-administered medications prior to visit.     Per HPI unless specifically indicated in ROS section below Review of  Systems  Constitutional:  Positive for fatigue and fever.  HENT:  Positive for congestion. Negative for sore throat.   Respiratory:  Positive for cough.   Objective:  Temp 97.8 F (36.6 C)   Ht 5' 5.7" (1.669 m)   Wt 175 lb (79.4 kg)   BMI 28.50 kg/m   Wt Readings from Last 3 Encounters:  10/10/20 175 lb (79.4 kg)  07/09/20 180 lb (81.6 kg)  05/30/20 178 lb (80.7 kg)       Physical exam: Gen: alert, NAD, appears fatigued and sickly.  Pulm: speaks in complete sentences without  increased work of breathing, no cough. Psych: normal mood, normal thought content      Results for orders placed or performed in visit on 08/06/20  TSH  Result Value Ref Range   TSH 1.12 0.35 - 4.50 uIU/mL   Assessment & Plan:   Problem List Items Addressed This Visit       Other   COVID-19 virus infection - Primary    Symptom onset two evenings ago. Two positive Covid tests last night.  She does appear sickly but stable. Discussed options for treatment. She is not a good candidate for Paxlovid given that she's on Eliquis. Will send in Molnupiravir 200 mg, discussed instructions for use today.  Also discussed OTC products and CDC guidelines for quarantine. Return precautions provided.        Relevant Medications   molnupiravir EUA 200 mg CAPS     Meds ordered this encounter  Medications   molnupiravir EUA 200 mg CAPS    Sig: Take 4 capsules (800 mg total) by mouth 2 (two) times daily for 5 days.    Dispense:  40 capsule    Refill:  0    Order Specific Question:   Supervising Provider    Answer:   BEDSOLE, AMY E [[id number]]    No orders of the defined types were placed in this encounter.   I discussed the assessment and treatment plan with the patient. The patient was provided an opportunity to ask questions and all were answered. The patient agreed with the plan and demonstrated an understanding of the instructions. The patient was advised to call back or seek an in-person evaluation if the symptoms worsen or if the condition fails to improve as anticipated.  Follow up plan:  Take the molnupiravir 200 mg capsules for Covid infection.  Take 4 capsules twice daily for five days.  It was a pleasure to see you today!   Pleas Koch, NP

## 2020-10-10 NOTE — Assessment & Plan Note (Signed)
Symptom onset two evenings ago. Two positive Covid tests last night.  She does appear sickly but stable. Discussed options for treatment. She is not a good candidate for Paxlovid given that she's on Eliquis. Will send in Molnupiravir 200 mg, discussed instructions for use today.  Also discussed OTC products and CDC guidelines for quarantine. Return precautions provided.

## 2020-10-11 MED ORDER — PREDNISONE 20 MG PO TABS
ORAL_TABLET | ORAL | 0 refills | Status: DC
Start: 2020-10-11 — End: 2021-01-09

## 2020-10-15 ENCOUNTER — Ambulatory Visit
Admission: RE | Admit: 2020-10-15 | Discharge: 2020-10-15 | Disposition: A | Discharge status: Home / Self Care | Payer: Medicare Other | Source: Ambulatory Visit | Attending: Primary Care | Admitting: Primary Care

## 2020-10-15 ENCOUNTER — Ambulatory Visit
Admission: RE | Admit: 2020-10-15 | Discharge: 2020-10-15 | Disposition: A | Discharge status: Home / Self Care | Payer: Medicare Other | Attending: Internal Medicine | Admitting: Internal Medicine

## 2020-10-15 DIAGNOSIS — U071 COVID-19: Secondary | ICD-10-CM | POA: Insufficient documentation

## 2020-10-15 IMAGING — CR DG CHEST 2V
1 series · 2 of 2 positions shown · non-contrast
Comparison: [DATE]

CLINICAL DATA: COVID infection 1 week ago with shortness of breath.

EXAM:
CHEST - 2 VIEW

[Series 1: dg chest 2 view · 0.14mm/px · 2 of 2 slices shown]
[im 1/2]
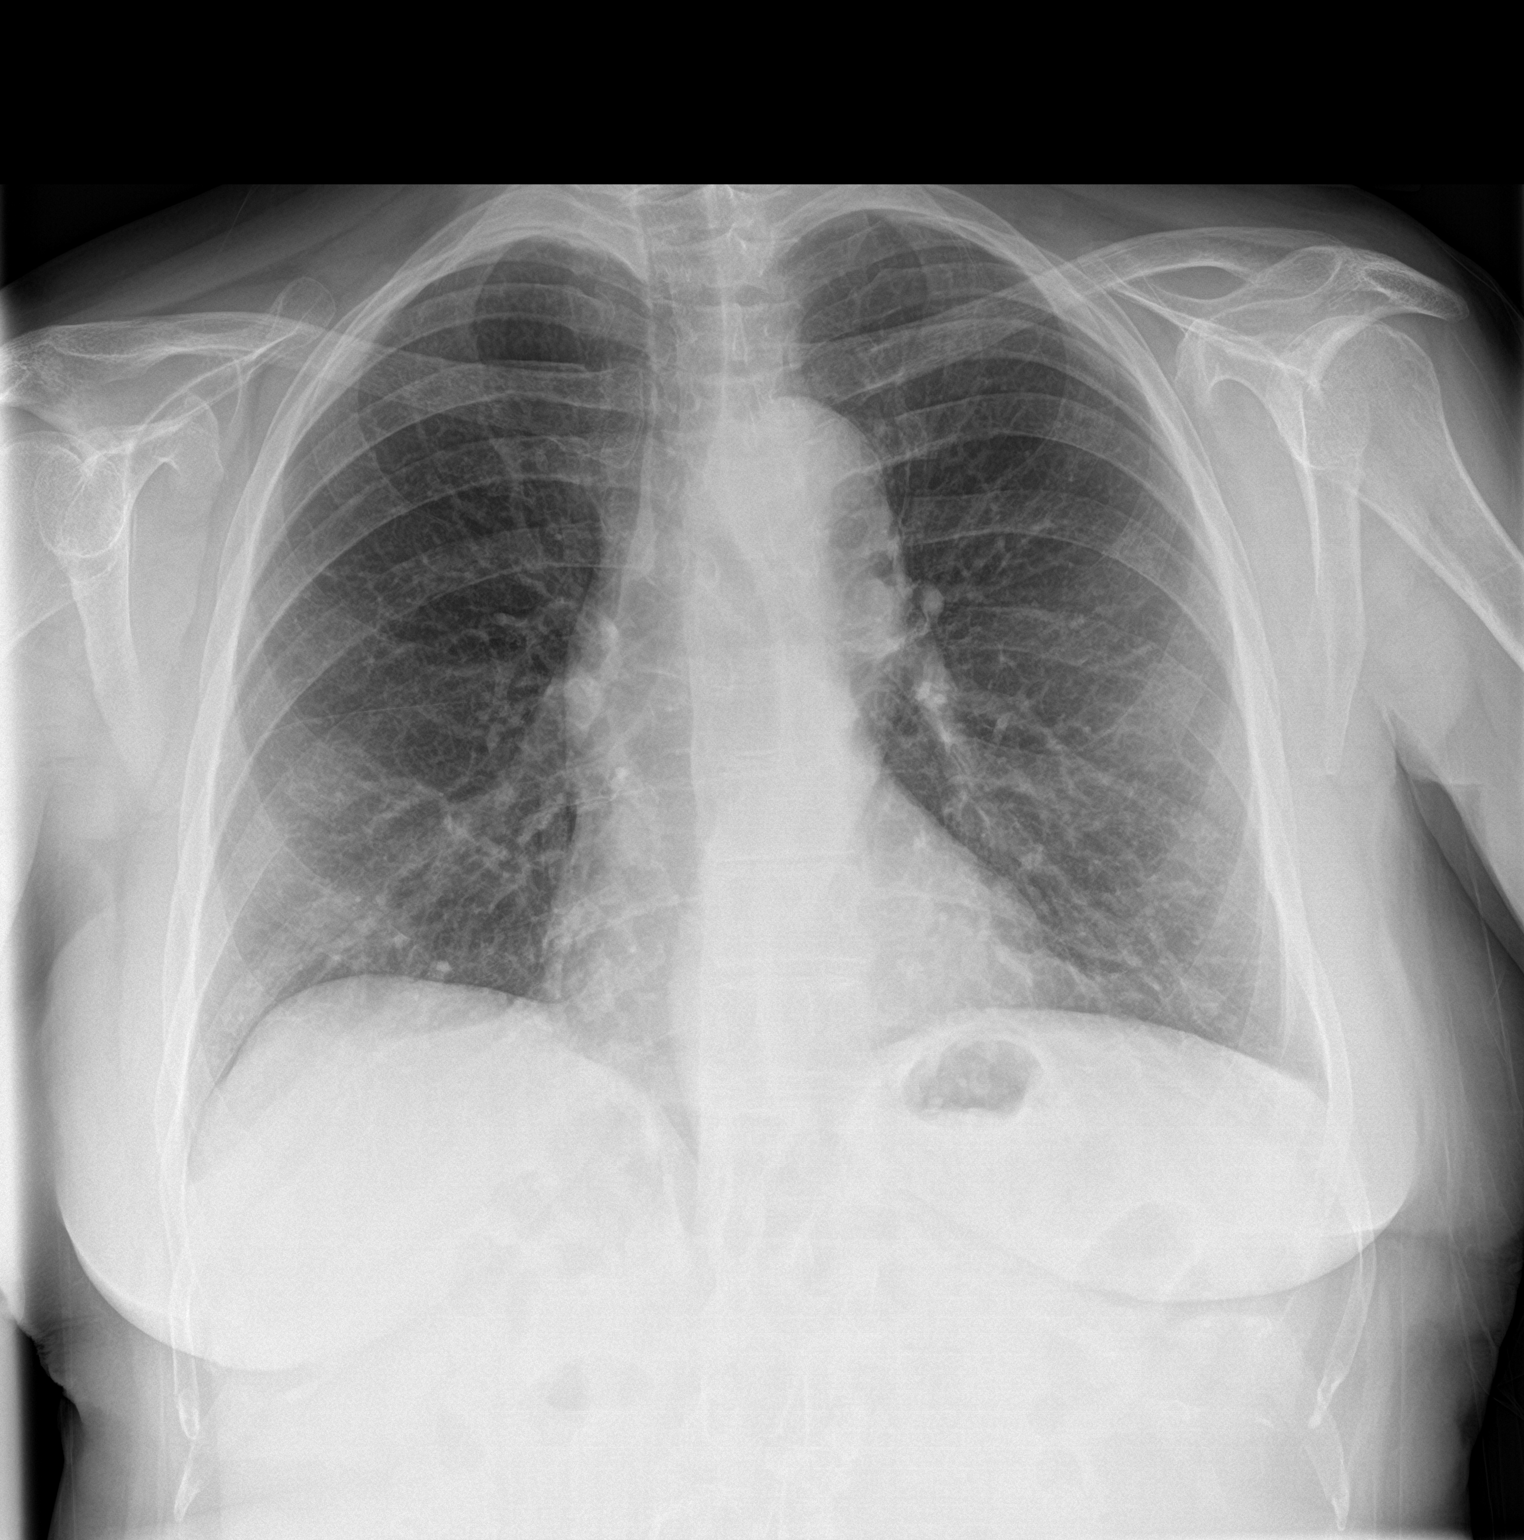
[im 2/2]
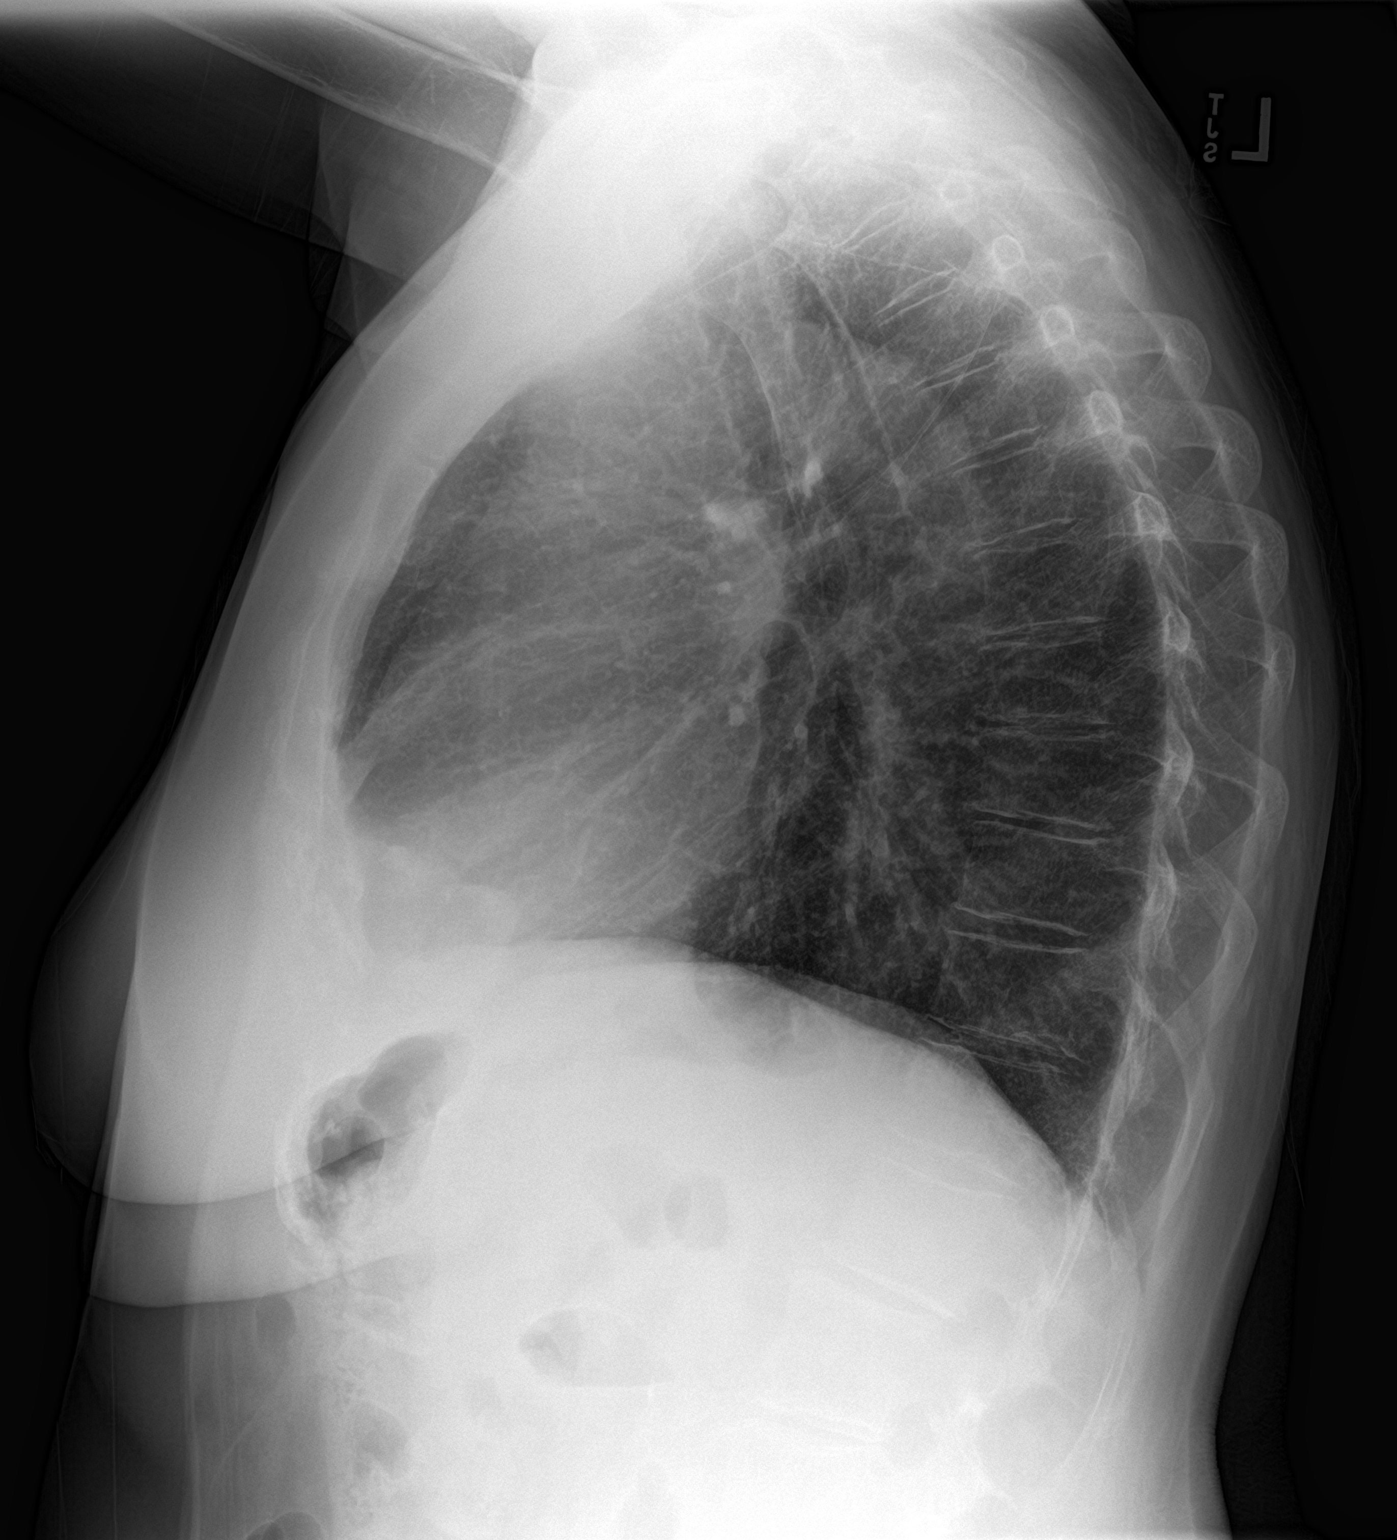

[2 of 2 positions shown; findings below may reference images not displayed]

FINDINGS: Mild hyperinflation. Midline trachea. Normal heart size and
mediastinal contours. No pleural effusion or pneumothorax. Similar
mild pulmonary interstitial thickening. No lobar consolidation.
IMPRESSION: No findings of pneumonia.

Mild hyperinflation and chronic interstitial thickening which may
represent asthma or chronic bronchitis in this nonsmoker.

## 2020-10-15 NOTE — Telephone Encounter (Signed)
Pt calling after sending pt message; pt had visit on 10/10/20; pt said she finished the molnupiravir on 10/14/20. Pt said on 10/13/20 all symptoms were gone. On 10/14/20 had S/T; pt denies S/T today. Pt was given prednisone but did not take due to the number of pills pt was taking already. Pt is not sure if should start prednisone now or schedule another visit; pt said she feels bronchitis coming on. Pt has no fever but has prod cough with yellow phlegm. Pt said this morning at 1AM pt woke up and could not breathe due to chest congestion; pt cleared some of the phlegm and could breathe but lives alone and pt was afraid to go back to sleep due to breathing issue that woke her up and pt does not want another night like last night. Pt said " when she woke up at 1AM she thought she was going to die due to not being able to get her breath due to chest congestion." Pt said the cough is very deep. Pt states that she is in no distress now but request cb ASAP. UC & ED precautions given and pt voiced  understanding. Sending note to Gentry Fitz NP and [facility] CMA; also sending teams message to both.

## 2020-10-15 NOTE — Telephone Encounter (Signed)
Called patient reviewed all information and repeated back to me. Will call if any questions.  She will go for xray now and start meds in the morning. No further questions at this time.

## 2020-10-15 NOTE — Telephone Encounter (Signed)
Call patient:  I recommend she go for a chest xray today. I will order for [facility] outpatient imaging on [address]. I won't have this back until tomorrow.   She should start the prednisone in the morning if she's not improving. If she takes this tonight it may keep her up all night.   If getting bad then needs to go to the ED or UC. I will try to keep her out of both places if possible.

## 2020-10-16 ENCOUNTER — Other Ambulatory Visit: Payer: Self-pay | Admitting: Primary Care

## 2020-10-16 DIAGNOSIS — U071 COVID-19: Secondary | ICD-10-CM

## 2020-10-16 MED ORDER — ALBUTEROL SULFATE HFA 108 (90 BASE) MCG/ACT IN AERS: 2.0000 | INHALATION_SPRAY | Freq: Four times a day (QID) | RESPIRATORY_TRACT | 0 refills | Status: DC | PRN

## 2020-10-18 ENCOUNTER — Telehealth: Payer: Self-pay | Admitting: Primary Care

## 2020-10-18 NOTE — Telephone Encounter (Signed)
Ms. Fails notified as instructed by telephone. She states her Covid test continues to be positive.  Patient ask if okay to take Robitussin for cough and to use Nasal Saline for congestion.  I advised okay to use.  ER precautions given.  Encouraged rest and pushing fluids over the weekend.  Continue prednisone as prescribed. Follow up virtual visit scheduled with Anda Kraft on 10/22/2020 at 12:20 pm.

## 2020-10-18 NOTE — Telephone Encounter (Signed)
PLEASE NOTE: All timestamps contained within this report are represented as [place] Standard Time. CONFIDENTIALTY NOTICE: This fax transmission is intended only for the addressee. It contains information that is legally privileged, confidential or otherwise protected from use or disclosure. If you are not the intended recipient, you are strictly prohibited from reviewing, disclosing, copying using or disseminating any of this information or taking any action in reliance on or regarding this information. If you have received this fax in error, please notify us immediately by telephone so that we can arrange for its return to [place]. Phone: [phone], Toll-Free: [phone], Fax: [phone] Page: 1 of 2 Call Id: [id number] [place] [facility] - Client TELEPHONE ADVICE RECORD AccessNurse Patient Name: Christie Wells INS Gender: Female DOB: [birth year] Age: 70 Y 9 M 13 D Return Phone Number: [phone] (Primary) Address: City/ State/ Zip: [place]  [address] Client [place] [facility] - Client Client Site [place] - Day Physician Alma Friendly - NP Contact Type Call Who Is Calling Patient / Member / Family / Caregiver Call Type Triage / Clinical Relationship To Patient Self Return Phone Number [phone] (Primary) Chief Complaint Cough Reason for Call Symptomatic / Request for [name] states she has covid experiencing insomnia congestion and cough. Translation No Nurse Assessment Nurse: Doyle Askew, RN, Beth Date/Time (Eastern Time): 10/18/2020 11:33:23 AM Confirm and document reason for call. If symptomatic, describe symptoms. ---Caller states she has covid (10th day) experiencing insomnia, congestion and cough. Caller states she did speak with PCP on Friday and was given Prednisone. Caller states Tuesday she had chest x-ray and was negative for pneumonia. Caller states PCP gave  her an inhaler 3 days ago. Caller states she was doing inhaler wrong until this morning and when she did it she started vomiting. Does the patient have any new or worsening symptoms? ---Yes Will a triage be completed? ---Yes Related visit to physician within the last 2 weeks? ---Yes Does the PT have any chronic conditions? (i.e. diabetes, asthma, this includes High risk factors for pregnancy, etc.) ---Yes List chronic conditions. ---HTN, heart ablation in December Is this a behavioral health or substance abuse call? ---No Guidelines Guideline Title Affirmed Question Affirmed Notes Nurse Date/Time (Eastern Time) COVID-19 - Persisting Symptoms Follow-up Call [1] PERSISTING SYMPTOMS OF COVID-19 AND [2] NEW symptom AND [3] could be serious Doyle Askew, RN, Beth 10/18/2020 11:40:16 AM PLEASE NOTE: All timestamps contained within this report are represented as [place] Standard Time. CONFIDENTIALTY NOTICE: This fax transmission is intended only for the addressee. It contains information that is legally privileged, confidential or otherwise protected from use or disclosure. If you are not the intended recipient, you are strictly prohibited from reviewing, disclosing, copying using or disseminating any of this information or taking any action in reliance on or regarding this information. If you have received this fax in error, please notify us immediately by telephone so that we can arrange for its return to [place]. Phone: [phone], Toll-Free: [phone], Fax: [phone] Page: 2 of 2 Call Id: [id number] [place]. Time Eilene Ghazi Time) Disposition Final User 10/18/2020 12:00:51 PM Call PCP Now Yes Doyle Askew, RN, Beth Caller Disagree/Comply Comply Caller Understands Yes PreDisposition Did not know what to do Care Advice Given Per Guideline CALL PCP NOW: * You need to discuss this with your doctor (or NP/PA). CALL BACK IF: * You become worse CARE ADVICE given per COVID-19 - Persisting Symptoms Follow-Up  Call (Adult) guideline. Comments User: Melene Muller, RN  Date/Time (Eastern Time): 10/18/2020 12:00:43 PM Tried to warm transfer caller to office - held for long tie then called back and finally spoke to [place] at office who told me to tell pt that a nurse would call her back. Relayed info to pt. Referrals REFERRED TO PCP OFFICE

## 2020-10-18 NOTE — Telephone Encounter (Signed)
Per patient's MyChart message yesterday she mentioned the inhaler seem to be helping with symptoms.  If the inhaler is causing her to vomit then I recommend she stop. Continue with prednisone.  If she feels that she is getting worse, despite the treatment that I have provided, then she needs to be evaluated again.  Please provide ED precautions for the weekend, set her up for a follow-up visit next week with either myself or another provider.

## 2020-10-18 NOTE — Telephone Encounter (Signed)
Noted, will evaluate. 

## 2020-10-18 NOTE — Telephone Encounter (Signed)
Access called in with Christie Wells and she stated that she had some covid questions and the outcome was to get them over to the PCP.

## 2020-10-18 NOTE — Telephone Encounter (Signed)
Patient called stating that she is on day 10 of covid,  Patient has been given prednisone and an inhaler.  Patient stated that she is not sleeping because of coughing.  Patient stated that she is coughing but not able to get it up. Patient stated that she does not have a fever at this time and mucus is clearer now.  Patient stated that she did a test today and it is still positive. Patient has a plane trip Wednesday and wants to know if she should cancel her trip. Patient stated that she just realized that she has not been using the inhaler correctly, but started doing it right last night. Patient stated last night after using the inhaler she threw up. Patient stated that she feels it is because there is so much congestion in her chest. Patient stated that she feels worse today. Patient wants to know should she use the inhaler even if she can breathe. Patient stated that she is not sure when she should use the inhaler. Patient wants to know about cough medication. Patient wants to know if she should be taking cough medication or just cough so that she can get the congestion up. Patient stated that she is not sleeping well because of the cough. Patient stated that she took Robitussin DM once last night that helped a little.  Patient stated that she feels that the congestion is now in her throat.Patient wants to know if you think Simply saline nasal spray will help with her nasal congestion. Patient stated that she just wants to know when she may start feeling better.  Pharmacy [facility]

## 2020-10-22 ENCOUNTER — Telehealth: Payer: Medicare Other | Admitting: Primary Care

## 2020-11-12 ENCOUNTER — Other Ambulatory Visit: Payer: Self-pay | Admitting: Primary Care

## 2020-11-12 DIAGNOSIS — U071 COVID-19: Secondary | ICD-10-CM

## 2020-11-14 ENCOUNTER — Other Ambulatory Visit: Payer: Self-pay

## 2020-11-14 ENCOUNTER — Ambulatory Visit (INDEPENDENT_AMBULATORY_CARE_PROVIDER_SITE_OTHER): Payer: Medicare Other | Admitting: Primary Care

## 2020-11-14 VITALS — BP 120/78 | HR 60 | Temp 98.6°F | Ht 66.0 in | Wt 171.0 lb

## 2020-11-14 DIAGNOSIS — R432 Parageusia: Secondary | ICD-10-CM | POA: Insufficient documentation

## 2020-11-14 DIAGNOSIS — G43109 Migraine with aura, not intractable, without status migrainosus: Secondary | ICD-10-CM

## 2020-11-14 DIAGNOSIS — Z23 Encounter for immunization: Secondary | ICD-10-CM

## 2020-11-14 NOTE — Assessment & Plan Note (Signed)
Chronic for years, does have some taste.  Evaluated by ENT. Will send to neurology, referral placed.

## 2020-11-14 NOTE — Assessment & Plan Note (Signed)
Chronic and recurrent, occurring 2-6 times monthly over the last year.   Long history of migraines since teenage years. On gabapentin for 10 years per neurology for migraine prevention.   Gabapentin 200 mg HS is not effective for migraine. Will try a dose increase of gabapentin to 300 mg, she has 300 mg capsules at home and will start. She will update.  Consider Topamax vs amitriptyline for migraine prevention. HR too low for propranolol.

## 2020-11-14 NOTE — Patient Instructions (Signed)
You will be contacted regarding your referral to neurology.  Please let us know if you have not been contacted within two weeks.   Start taking gabapentin 300 mg nightly for migraine prevention. Please update me as discussed.  It was a pleasure to see you today!

## 2020-11-14 NOTE — Progress Notes (Signed)
Subjective:    Patient ID: Christie Wells, female    DOB: Jun 29, 1950, 70 y.o.   MRN: [id number]  HPI  Christie Wells is a very pleasant 70 y.o. female with a history of hypertension, atrial flutter/fibrillation s/p ablation, hypothyroidism, dizziness, anxiety who presents today to discuss migraines and chronic loss of taste.   1) Migraines:  Chronic migraine for the last 50 years.   She is currently managed on sumatriptan 50 mg PRN for migraines, gabapentin 200 mg HS for chronic back pain. She's been requesting recurrent refills of her sumatriptan so she was asked to come in for migraine evaluation.  During the year of 2021 she had 1-2 migraines monthly. During the year of 2022 she's had 2 migraines every month except for four migraines in June, six migraines in July, five migraines in August. Her last migraine was 11/11/20.  Her migraines begin with an aura left eye, sometimes both eyes with headache to the parietal and temporal regions, band like pressure. She denies nausea.   She is taking sumatriptan 50 mg with significant improvement, mild headache. Two hours later she will take Tylenol with complete resolve.   She is managed on gabapentin 200 mg HS for which she's taken for years due to migraines. Originally prescribed by neurology.   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 Evaluated by ENT years ago who could not find a cause. She has not mentioned this to her dentist. She denies a family history of oral cancer. Her tongue always feels "burnt" without injury. When managed on losartan this increased her sensation of burnt tongue.   BP Readings from Last 3 Encounters:  11/14/20 120/78  07/09/20 118/74  05/30/20 126/72      Review of Systems  Eyes:  Positive for photophobia.  Gastrointestinal:  Negative for nausea.  Neurological:  Positive for headaches.        Past Medical History:  Diagnosis Date   Arthritis    Chronic  headaches    Colitis 1984   GERD (gastroesophageal reflux disease)    Hypothyroidism    Migraines    TIA (transient ischemic attack)    UTI (urinary tract infection)     Social History   Socioeconomic History   Marital status: Divorced    Spouse name: Not on file   Number of children: 2   Years of education: Not on file   Highest education level: Not on file  Occupational History   Not on file  Tobacco Use   Smoking status: Never   Smokeless tobacco: Never  Vaping Use   Vaping Use: Never used  Substance and Sexual Activity   Alcohol use: Yes    Alcohol/week: 1.0 standard drink    Types: 1 Shots of liquor per week   Drug use: No   Sexual activity: Not on file  Other Topics Concern   Not on file  Social History Narrative   Divorced.   2 children.   Moved from [place].   Once worked as an Journalist, newspaper.       Social Determinants of Health   Financial Resource Strain: Not on file  Food Insecurity: Not on file  Transportation Needs: Not on file  Physical Activity: Not on file  Stress: Not on file  Social Connections: Not on file  Intimate Partner Violence: Not on file    Past Surgical History:  Procedure Laterality Date   BUNIONECTOMY Bilateral  CARDIAC ELECTROPHYSIOLOGY STUDY AND ABLATION  02/22/2020   COLONOSCOPY  2015   EXCISION OF BREAST BIOPSY Left    FOOT SURGERY Bilateral    5 screws    TUBAL LIGATION     UPPER GI ENDOSCOPY     Gastritis    Family History  Problem Relation Age of Onset   Alzheimer's disease Mother    Hypertension Mother    Hyperlipidemia Mother    COPD Mother    Aneurysm Mother    Stroke Father    Hypertension Father    Heart disease Maternal Grandmother    Parkinson's disease Paternal Grandfather    Aneurysm Brother    Colon cancer Neg Hx    Esophageal cancer Neg Hx     Allergies  Allergen Reactions   Codeine Nausea And Vomiting    Other reaction(s): vomiting    Peanut-Containing Drug Products      Headaches   Sulfa Antibiotics Rash    Other reaction(s): rash    Current Outpatient Medications on File Prior to Visit  Medication Sig Dispense Refill   albuterol (VENTOLIN HFA) 108 (90 Base) MCG/ACT inhaler Inhale 2 puffs into the lungs every 6 (six) hours as needed for shortness of breath. 1 each 0   amLODipine (NORVASC) 2.5 MG tablet TAKE 1 TABLET (2.5 MG TOTAL) BY MOUTH IN THE MORNING AND AT BEDTIME. FOR BLOOD PRESSURE. 90 tablet 0   apixaban (ELIQUIS) 5 MG TABS tablet Take 1 tablet (5 mg total) by mouth 2 (two) times daily. 180 tablet 3   celecoxib (CELEBREX) 100 MG capsule Take 100 mg by mouth 2 (two) times daily.     cholecalciferol (VITAMIN D) 1000 units tablet Take 3,000 Units by mouth daily.     cyanocobalamin 1000 MCG tablet Take 1,000 mcg by mouth daily.     gabapentin (NEURONTIN) 100 MG capsule Take 2 capsules (200 mg total) by mouth at bedtime. For back pain. 180 capsule 1   hydrocortisone (ANUSOL-HC) 2.5 % rectal cream Place 1 application rectally 2 (two) times daily. For hemorrhoids 30 g 0   hydrOXYzine (ATARAX/VISTARIL) 10 MG tablet TAKE 1-2 TABLETS (10-20 MG TOTAL) BY MOUTH 2 (TWO) TIMES DAILY AS NEEDED FOR ANXIETY. 90 tablet 0   lansoprazole (PREVACID) 30 MG capsule Take 1 capsule (30 mg total) by mouth 2 (two) times daily before a meal. For heartburn. 180 capsule 1   levothyroxine (SYNTHROID) 88 MCG tablet TAKE 1 TAB EVERY MORNING ON AN EMPTY STOMACH WITH WATER ONLY. NO FOOD OR OTHER MEDS FOR 30 MIN 90 tablet 3   predniSONE (DELTASONE) 20 MG tablet Take 2 tablets by mouth once daily for 5 days. 10 tablet 0   SUMAtriptan (IMITREX) 50 MG tablet 1 TABLET BY MOUTH AT MIGRAINE ONSET MAY REPEAT WITH SECOND TABLET IN 2 HOURS IF MIGRAINE PERSIST 10 tablet 0   No current facility-administered medications on file prior to visit.    BP 120/78   Pulse 60   Temp 98.6 F (37 C) (Temporal)   Ht '5\' 6"'$  (1.676 m)   Wt 171 lb (77.6 kg)   SpO2 98%   BMI 27.60 kg/m  Objective:    Physical Exam Eyes:     Extraocular Movements: Extraocular movements intact.  Cardiovascular:     Rate and Rhythm: Normal rate and regular rhythm.  Pulmonary:     Effort: Pulmonary effort is normal.     Breath sounds: Normal breath sounds.  Musculoskeletal:     Cervical back: Neck supple.  Skin:    General: Skin is warm and dry.  Neurological:     Mental Status: She is oriented to person, place, and time.     Cranial Nerves: No cranial nerve deficit.          Assessment & Plan:      This visit occurred during the SARS-CoV-2 public health emergency.  Safety protocols were in place, including screening questions prior to the visit, additional usage of staff PPE, and extensive cleaning of exam room while observing appropriate contact time as indicated for disinfecting solutions.

## 2020-11-15 DIAGNOSIS — Z23 Encounter for immunization: Secondary | ICD-10-CM

## 2020-11-26 ENCOUNTER — Other Ambulatory Visit: Payer: Self-pay | Admitting: Primary Care

## 2020-11-26 ENCOUNTER — Telehealth: Payer: Self-pay | Admitting: Primary Care

## 2020-11-26 DIAGNOSIS — G43101 Migraine with aura, not intractable, with status migrainosus: Secondary | ICD-10-CM

## 2020-11-26 NOTE — Telephone Encounter (Signed)
Pt called stating she is on vacation and forgot her pills and would like for you to call in her prescription in

## 2020-11-26 NOTE — Telephone Encounter (Signed)
Pt  called stating that she is on vacation and she forgot her pills(Sumatriptqan) and in order to get some she will need renewal. And she knows she cant go 6 days without them so she will need a renewal

## 2020-11-26 NOTE — Telephone Encounter (Signed)
Called patient needs called into pharmacy as listed in encounter.

## 2020-11-27 MED ORDER — SUMATRIPTAN SUCCINATE 50 MG PO TABS: ORAL_TABLET | ORAL | 0 refills | Status: DC

## 2020-11-27 NOTE — Telephone Encounter (Signed)
Rx sent to pharmacy pended.  Please ask patient how often she's having migraines. Any improvement since we increased her gabapentin to 300 mg?

## 2020-12-03 NOTE — Telephone Encounter (Signed)
No has not had improvement with the dose increase. Has had aura twice and double migraine last Friday 11/29/20, which she has not had in years. Same amount and intensity of migraines as she has been having, so far had 5 episodes for this month.

## 2020-12-04 NOTE — Telephone Encounter (Signed)
Please call patient:  Gabapentin does not seem to be working to control migraines any longer.  Would she be willing to try Topamax at bedtime for migraine prevention?  If so then I will send a prescription to her pharmacy.  Have her update me via MyChart in 2 weeks  Also, does she want to continue the gabapentin?  I cannot remember if she is also using this for her back pain or not.

## 2020-12-11 NOTE — Telephone Encounter (Signed)
Patient notified as instructed by telephone and verbalized understanding. Patient stated that she is going to do some research on Topamax and will send a message thru mychart to Allie Bossier NP to let her know if she wants to try it. Patient stated that she has not had a headache in 12 days, but has been taking 300 mg of Gabapentin. Patient stated that she has not been taking Gabapentin for back pain. Patient stated that the Gabapentin 300 mg is making her so groggy that she does not think that she can continue to take that dose. Patient stated that she will be sending you a message thru about mychart about Topamax.

## 2020-12-11 NOTE — Telephone Encounter (Signed)
Noted  

## 2020-12-12 MED ORDER — TOPIRAMATE 50 MG PO TABS: 50.0000 mg | ORAL_TABLET | Freq: Every day | ORAL | 0 refills | Status: DC

## 2020-12-13 ENCOUNTER — Other Ambulatory Visit: Payer: Self-pay | Admitting: Primary Care

## 2020-12-13 DIAGNOSIS — I10 Essential (primary) hypertension: Secondary | ICD-10-CM

## 2020-12-18 ENCOUNTER — Telehealth: Payer: Self-pay

## 2020-12-18 NOTE — Telephone Encounter (Signed)
I sent in a prescription for Topamax to her pharmacy on 12/12/2020 for headache prevention.  Did her diarrhea started after she started taking this medication?  Any recent antibiotic use?  Agree with discontinuation of Imodium.

## 2020-12-18 NOTE — Telephone Encounter (Signed)
[facility] - Client TELEPHONE ADVICE RECORD [facility] Patient Name: Christie Wells Gender: Female DOB: 1950-03-28 Age: 70 Y 11 M 12 D Return Phone Number: [phone] (Primary) Address: City/ State/ Zip: [place] [address] Client [facility] - Client Client Site [facility] Physician Alma Friendly - NP Contact Type Call Who Is Calling Patient / Member / Family / Caregiver Call Type Triage / Clinical Relationship To Patient Self Return Phone Number [phone] (Primary) Chief Complaint Diarrhea Reason for Call Symptomatic / Request for Health Information Initial Comment Caller states she has diarrhea since Saturday, started ammonium Translation No Nurse Assessment Nurse: Zorita Pang, RN, Neoma Laming Date/Time (Eastern Time): 12/18/2020 10:14:09 AM Confirm and document reason for call. If symptomatic, describe symptoms. ---Caller states that she has been having diarrhea since Saturday (six times a day). Started on Immodium on Sunday. No nausea or vomiting. No fever. Has bloating but no abdominal pain. Does the patient have any new or worsening symptoms? ---Yes Will a triage be completed? ---Yes Related visit to physician within the last 2 weeks? ---No Does the PT have any chronic conditions? (i.e. diabetes, asthma, this includes High risk factors for pregnancy, etc.) ---Yes List chronic conditions. ---hypertension, heart ablation for flutter and takes blood thinner Is this a behavioral health or substance abuse call? ---No Guidelines Guideline Title Affirmed Question Affirmed Notes Nurse Date/Time (Eastern Time) Diarrhea MILD-MODERATE diarrhea (e.g., 1-6 times / day more than normal) Womble, RN, Neoma Laming 12/18/2020 10:18:08 AM Disp. Time Eilene Ghazi Time) Disposition Final User 12/18/2020 10:29:37 AM Home Care Yes Zorita Pang, RN, Neoma Laming PLEASE NOTE: All timestamps  contained within this report are represented as [place] Standard Time. CONFIDENTIALTY NOTICE: This fax transmission is intended only for the addressee. It contains information that is legally privileged, confidential or otherwise protected from use or disclosure. If you are not the intended recipient, you are strictly prohibited from reviewing, disclosing, copying using or disseminating any of this information or taking any action in reliance on or regarding this information. If you have received this fax in error, please notify us immediately by telephone so that we can arrange for its return to [place]. Phone: [phone], Toll-Free: [phone], Fax: [phone] Page: 2 of 2 Call Id: [id number] [name] Disagree/Comply Comply Caller Understands Yes PreDisposition Call Doctor Care Advice Given Per Guideline * Viral diarrhea lasts 4 to 7 days. * Diarrhea lasts over 7 days Comments User: Marquis Buggy, RN Date/Time Eilene Ghazi Time): 12/18/2020 10:18:34 AM Retired and moved to [facility]. Had chronic diarrhea and it resolved. User: Marquis Buggy, RN Date/Time Eilene Ghazi Time): 12/18/2020 10:32:37 AM Caller was instructed to stop the immodium as she has tried that for 2 days already. Having 3 loose stools a day and bloating but no abdominal pain or fever. She was instructed to eat yogurt or take a GI specific probiotic to see if she can get her normal flora reestablished and to call back if the diarrhea worsens or lasts longer than 7 days and she verbalized understanding.

## 2020-12-19 DIAGNOSIS — R519 Headache, unspecified: Secondary | ICD-10-CM

## 2020-12-19 DIAGNOSIS — G43109 Migraine with aura, not intractable, without status migrainosus: Secondary | ICD-10-CM

## 2020-12-20 NOTE — Telephone Encounter (Signed)
Left message to return call to our office.  

## 2020-12-26 ENCOUNTER — Encounter: Payer: Medicare Other | Admitting: Primary Care

## 2020-12-27 NOTE — Telephone Encounter (Signed)
Left message to return call to our office.  

## 2020-12-30 ENCOUNTER — Ambulatory Visit
Admission: RE | Admit: 2020-12-30 | Discharge: 2020-12-30 | Disposition: A | Discharge status: Home / Self Care | Payer: Medicare Other | Source: Ambulatory Visit | Attending: Primary Care | Admitting: Primary Care

## 2020-12-30 ENCOUNTER — Other Ambulatory Visit: Payer: Self-pay

## 2020-12-30 DIAGNOSIS — R519 Headache, unspecified: Secondary | ICD-10-CM

## 2020-12-30 DIAGNOSIS — G43109 Migraine with aura, not intractable, without status migrainosus: Secondary | ICD-10-CM | POA: Diagnosis not present

## 2020-12-30 IMAGING — MR MR HEAD W/O CM
12 series · 48 of 48 positions shown · non-contrast
Comparison: None.

CLINICAL DATA: Headache, worsening migraines

EXAM:
MRI HEAD WITHOUT CONTRAST
TECHNIQUE: Multiplanar, multiecho pulse sequences of the brain and surrounding
structures were obtained without intravenous contrast.

[Series 5: ax dwi_tracew · axial · 3.0mm · 1.80mm/px · z∈[-82,+72]mm · 4 of 48 slices shown]
[im 1/48]
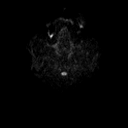
[im 16/48]
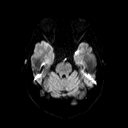
[im 32/48]
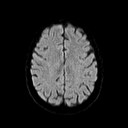
[im 48/48]
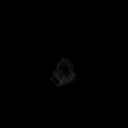

[Series 6: ax dwi_adc · axial · 3.0mm · 1.80mm/px · z∈[-82,+72]mm · 3 of 48 slices shown]
[im 1/48]
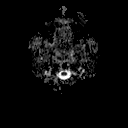
[im 24/48]
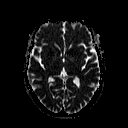
[im 48/48]
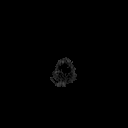

[Series 7: cor dwi_tracew · coronal · 5.0mm · 1.80mm/px · 3 of 38 slices shown]
[im 1/38]
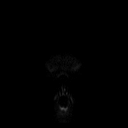
[im 19/38]
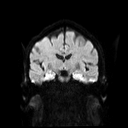
[im 38/38]
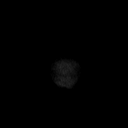

[Series 8: cor dwi_adc · coronal · 5.0mm · 1.80mm/px · 3 of 38 slices shown]
[im 1/38]
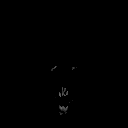
[im 19/38]
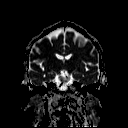
[im 38/38]
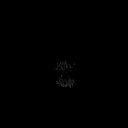

[Series 9: T1 · sagittal · 5.0mm · 0.62mm/px · 2 of 24 slices shown (1 of 2)]
[im 1/24]
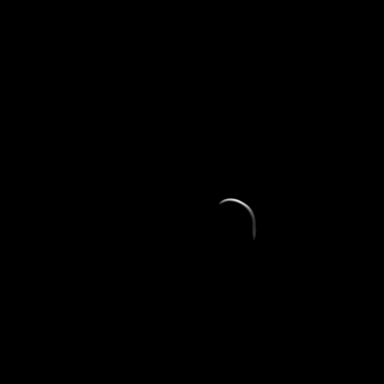
[im 24/24]
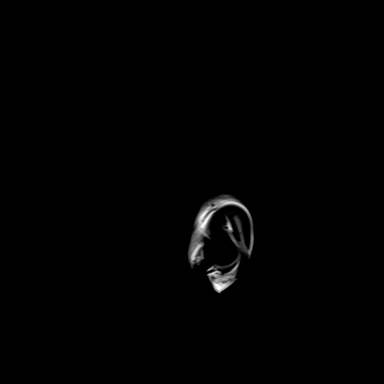

[Series 10: T2 · axial · 5.0mm · 0.53mm/px · z∈[-82,+73]mm · 2 of 27 slices shown (1 of 2)]
[im 1/27]
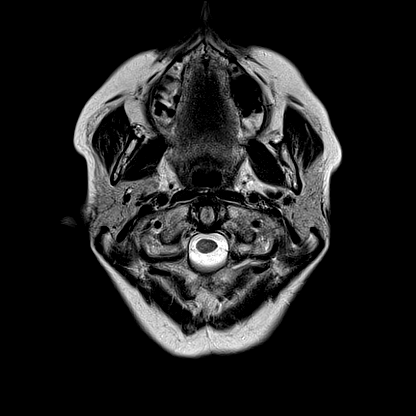
[im 27/27]
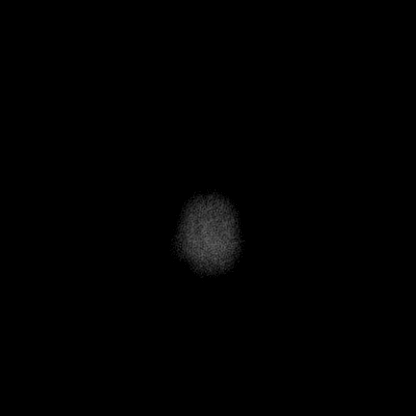

[Series 11: mag_images · axial · 3.0mm · 0.90mm/px · z∈[-94,+82]mm · 4 of 60 slices shown]
[im 1/60]
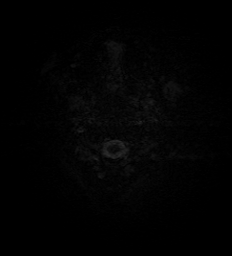
[im 20/60]
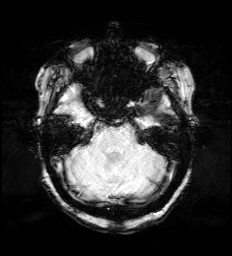
[im 40/60]
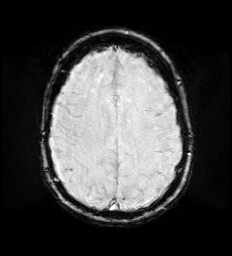
[im 60/60]
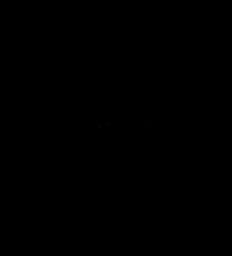

[Series 12: pha_images · axial · 3.0mm · 0.90mm/px · z∈[-94,+73]mm · 4 of 57 slices shown]
[im 1/57]
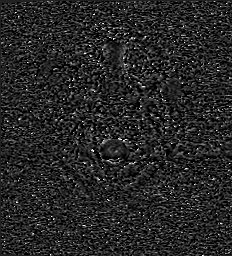
[im 19/57]
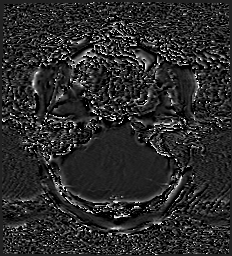
[im 38/57]
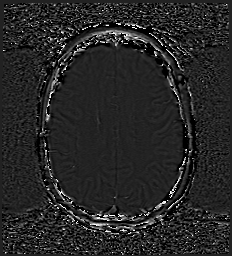
[im 57/57]
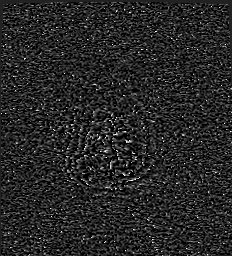

[Series 13: swi_images · axial · 3.0mm · 0.90mm/px · z∈[-94,+82]mm · 4 of 60 slices shown]
[im 1/60]
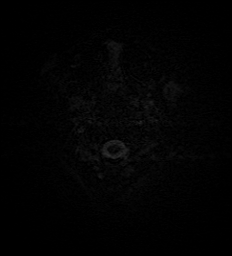
[im 20/60]
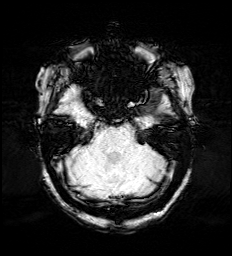
[im 40/60]
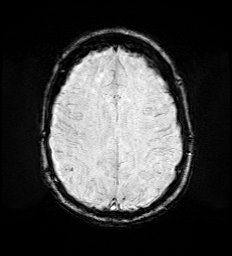
[im 60/60]
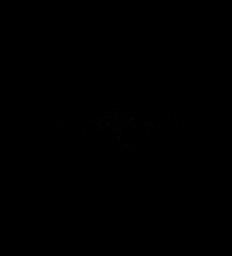

[Series 15: FLAIR · axial · 3.0mm · 0.53mm/px · z∈[-85,+76]mm · 4 of 55 slices shown]
[im 1/55]
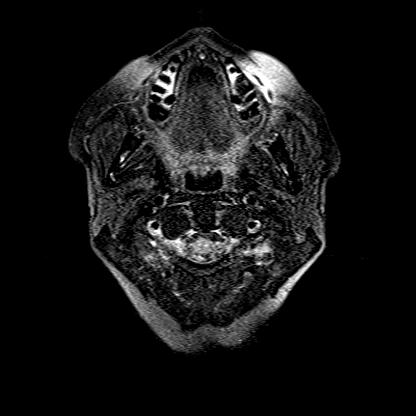
[im 19/55]
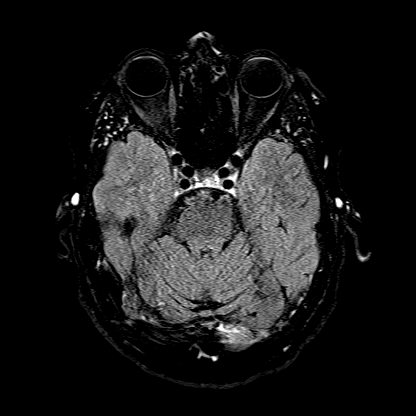
[im 37/55]
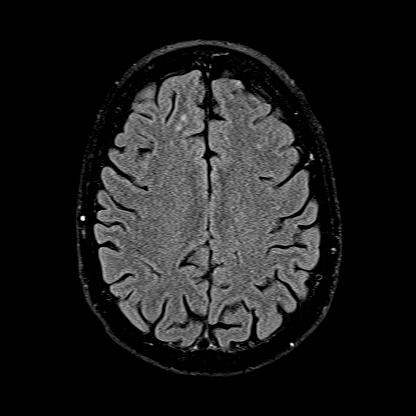
[im 55/55]
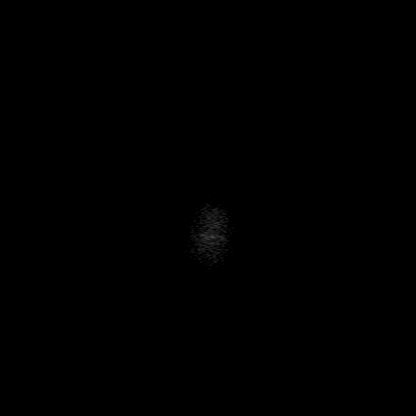

[Series 16: T1 · axial · 1.0mm · 0.98mm/px · z∈[-93,+80]mm · 13 of 175 slices shown (2 of 2)]
[im 1/175]
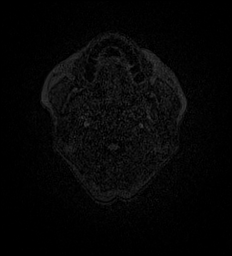
[im 15/175]
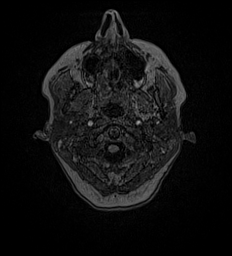
[im 30/175]
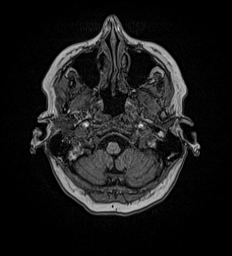
[im 44/175]
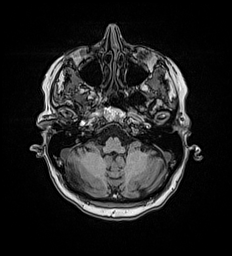
[im 59/175]
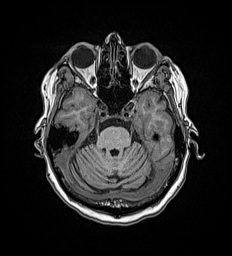
[im 73/175]
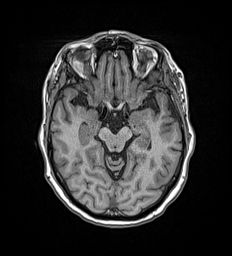
[im 88/175]
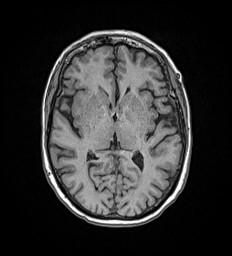
[im 102/175]
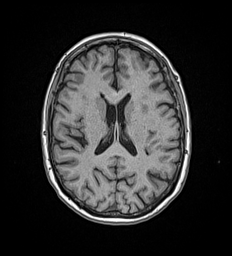
[im 117/175]
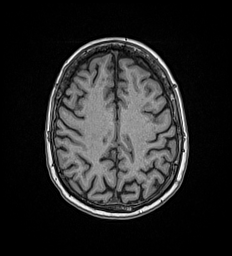
[im 131/175]
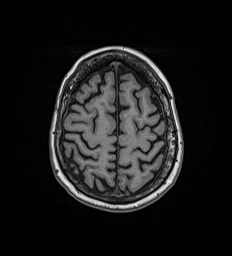
[im 146/175]
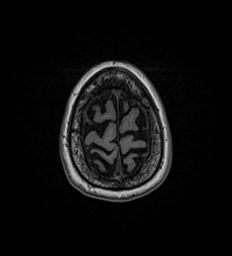
[im 160/175]
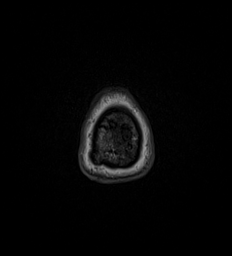
[im 175/175]
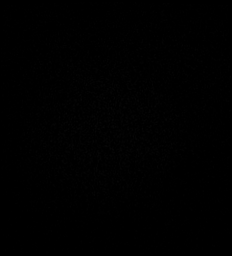

[Series 17: T2 · coronal · 5.0mm · 0.57mm/px · 2 of 29 slices shown (2 of 2)]
[im 1/29]
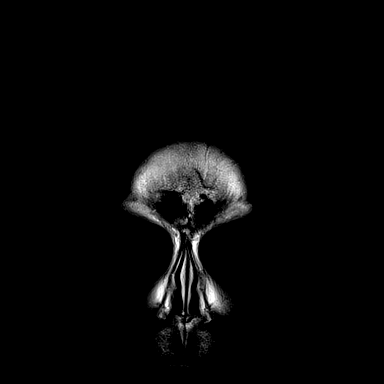
[im 29/29]
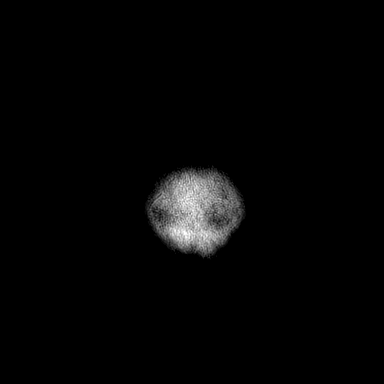

[48 of 48 positions shown; findings below may reference images not displayed]

FINDINGS: Brain: There is no evidence of acute intracranial hemorrhage,
extra-axial fluid collection, or acute infarct.

There are scattered foci of FLAIR signal abnormality throughout the
subcortical and periventricular white matter, nonspecific.

Parenchymal volume is normal.  The ventricles are normal in size.

There is no mass lesion.  There is no midline shift.

Vascular: Normal flow voids.

Skull and upper cervical spine: Normal marrow signal.

Sinuses/Orbits: The paranasal sinuses are clear. Bilateral lens
implants are in place. The globes and orbits are otherwise
unremarkable.

Other: None.
IMPRESSION: 1. No acute intracranial pathology.
2. Scattered foci of FLAIR signal abnormality in the subcortical and
periventricular white matter are nonspecific. These are most
commonly seen in the setting of chronic white matter
microangiopathy, but can also be seen the setting of demyelination,
chronic headaches, prior infection, among other etiologies.

These results were called by telephone at the time of interpretation
on [DATE] at [DATE] to provider CHIKASHI , who verbally
acknowledged these results.

## 2020-12-30 NOTE — Telephone Encounter (Signed)
Wanted to make sure you had addressed with patient looks like there were some my chart messages with her.

## 2020-12-31 DIAGNOSIS — G8929 Other chronic pain: Secondary | ICD-10-CM

## 2020-12-31 NOTE — Telephone Encounter (Signed)
Thanks. Yes, we've been discussing.

## 2021-01-03 ENCOUNTER — Other Ambulatory Visit: Payer: Self-pay | Admitting: Primary Care

## 2021-01-03 DIAGNOSIS — G43109 Migraine with aura, not intractable, without status migrainosus: Secondary | ICD-10-CM

## 2021-01-09 ENCOUNTER — Encounter: Payer: Self-pay | Admitting: Primary Care

## 2021-01-09 ENCOUNTER — Telehealth (INDEPENDENT_AMBULATORY_CARE_PROVIDER_SITE_OTHER): Payer: Medicare Other | Admitting: Primary Care

## 2021-01-09 VITALS — Ht 66.0 in | Wt 171.0 lb

## 2021-01-09 DIAGNOSIS — G43109 Migraine with aura, not intractable, without status migrainosus: Secondary | ICD-10-CM

## 2021-01-09 DIAGNOSIS — J069 Acute upper respiratory infection, unspecified: Secondary | ICD-10-CM | POA: Insufficient documentation

## 2021-01-09 DIAGNOSIS — J302 Other seasonal allergic rhinitis: Secondary | ICD-10-CM

## 2021-01-09 HISTORY — DX: Acute upper respiratory infection, unspecified: J06.9

## 2021-01-09 MED ORDER — BENZONATATE 200 MG PO CAPS
200.0000 mg | ORAL_CAPSULE | Freq: Three times a day (TID) | ORAL | 0 refills | Status: DC | PRN
Start: 2021-01-09 — End: 2021-01-23

## 2021-01-09 MED ORDER — IPRATROPIUM BROMIDE 0.03 % NA SOLN: 2.0000 | Freq: Two times a day (BID) | NASAL | 0 refills | Status: DC | PRN

## 2021-01-09 NOTE — Progress Notes (Signed)
Patient ID: Christie Wells, female    DOB: 1950/06/08, 70 y.o.   MRN: [id number]  Virtual visit completed through [employer], a video enabled telemedicine application. Due to national recommendations of social distancing due to COVID-19, a virtual visit is felt to be most appropriate for this patient at this time. Reviewed limitations, risks, security and privacy concerns of performing a virtual visit and the availability of in person appointments. I also reviewed that there may be a patient responsible charge related to this service. The patient agreed to proceed.   Patient location: home Provider location: [facility], office Persons participating in this virtual visit: patient, provider   If any vitals were documented, they were collected by patient at home unless specified below.    Ht 5\' 6"  (1.676 m)   Wt 171 lb (77.6 kg)   BMI 27.60 kg/m    CC: Chest congestion  Subjective:   HPI: Christie Wells is a 70 y.o. female presenting on 01/09/2021 for to discuss chest congestion and several other questions.   1) Cough: She was in a car with her friend 10 days ago, found out later that day that her friend had Covid-19. She's taken four Covid-19 tests, all of which were negative.   She also reports cough and nasal drainage, mild cough. Symptoms began about 6 days ago. She denies body aches, chills, fevers Overall she's feeling well.   She saw chest xray results from August 2022, is concerned about the "chronic interstitial thickening which may represent asthma or chronic bronchitis in this nonsmoker". She denies a history of smoking and asthma.   She would like a refill of her ipratropium nasal spray. She has been taking Coricidin with improvement.   2) Migraines/Frequent headaches: Recurrent for the last few months. MRI brain completed a few weeks ago, negative for acute findings. Referred to Neurology a few weeks ago given persistent headaches despite treatment. Evaluated  last week due to persistent, daily migraines. During her recent visit she was initiated on nortriptyline but has yet to start.    She saw her cataract doctor, was told that migraines could be from her increased exposure to sun post removal of cataracts. She has not taken nortriptyline as of yet, is going to try wearing sunglasses when outdoors as recommended by her eye doctor.          Relevant past medical, surgical, family and social history reviewed and updated as indicated. Interim medical history since our last visit reviewed. Allergies and medications reviewed and updated. Outpatient Medications Prior to Visit  Medication Sig Dispense Refill   amLODipine (NORVASC) 2.5 MG tablet TAKE 1 TABLET (2.5 MG TOTAL) BY MOUTH IN THE MORNING AND AT BEDTIME. FOR BLOOD PRESSURE. 180 tablet 1   apixaban (ELIQUIS) 5 MG TABS tablet Take 1 tablet (5 mg total) by mouth 2 (two) times daily. 180 tablet 3   celecoxib (CELEBREX) 100 MG capsule Take 100 mg by mouth 2 (two) times daily.     cholecalciferol (VITAMIN D) 1000 units tablet Take 3,000 Units by mouth daily.     cyanocobalamin 1000 MCG tablet Take 1,000 mcg by mouth daily.     gabapentin (NEURONTIN) 100 MG capsule Take 2 capsules (200 mg total) by mouth at bedtime. For back pain. 180 capsule 1   hydrocortisone (ANUSOL-HC) 2.5 % rectal cream Place 1 application rectally 2 (two) times daily. For hemorrhoids 30 g 0   hydrOXYzine (ATARAX/VISTARIL) 10 MG tablet TAKE 1-2 TABLETS (10-20  MG TOTAL) BY MOUTH 2 (TWO) TIMES DAILY AS NEEDED FOR ANXIETY. 90 tablet 0   lansoprazole (PREVACID) 30 MG capsule Take 1 capsule (30 mg total) by mouth 2 (two) times daily before a meal. For heartburn. 180 capsule 1   levothyroxine (SYNTHROID) 88 MCG tablet TAKE 1 TAB EVERY MORNING ON AN EMPTY STOMACH WITH WATER ONLY. NO FOOD OR OTHER MEDS FOR 30 MIN 90 tablet 3   SUMAtriptan (IMITREX) 50 MG tablet 1 TABLET BY MOUTH AT MIGRAINE ONSET MAY REPEAT WITH SECOND TABLET IN 2 HOURS IF  MIGRAINE PERSIST 10 tablet 0   albuterol (VENTOLIN HFA) 108 (90 Base) MCG/ACT inhaler Inhale 2 puffs into the lungs every 6 (six) hours as needed for shortness of breath. (Patient not taking: Reported on 01/09/2021) 1 each 0   predniSONE (DELTASONE) 20 MG tablet Take 2 tablets by mouth once daily for 5 days. 10 tablet 0   No facility-administered medications prior to visit.     Per HPI unless specifically indicated in ROS section below Review of Systems  Constitutional:  Negative for chills, fatigue and fever.  HENT:  Positive for congestion and postnasal drip. Negative for sore throat.   Respiratory:  Positive for cough. Negative for shortness of breath.   Cardiovascular:  Negative for chest pain.  Neurological:  Positive for headaches.  Objective:  Ht 5\' 6"  (1.676 m)   Wt 171 lb (77.6 kg)   BMI 27.60 kg/m   Wt Readings from Last 3 Encounters:  01/09/21 171 lb (77.6 kg)  11/14/20 171 lb (77.6 kg)  10/10/20 175 lb (79.4 kg)       Physical exam: General: Alert and oriented x 3, no distress, does not appear sickly  Pulmonary: Speaks in complete sentences without increased work of breathing, no cough during visit. Dry cough noted twice during visit.   Psychiatric: Normal mood, thought content, and behavior.     Results for orders placed or performed in visit on 08/06/20  TSH  Result Value Ref Range   TSH 1.12 0.35 - 4.50 uIU/mL   Assessment & Plan:   Problem List Items Addressed This Visit   None Visit Diagnoses     Viral URI    -  Primary   Relevant Medications   benzonatate (TESSALON) 200 MG capsule   Seasonal allergic rhinitis, unspecified trigger       Relevant Medications   ipratropium (ATROVENT) 0.03 % nasal spray        Meds ordered this encounter  Medications   benzonatate (TESSALON) 200 MG capsule    Sig: Take 1 capsule (200 mg total) by mouth 3 (three) times daily as needed for cough.    Dispense:  15 capsule    Refill:  0    Order Specific  Question:   Supervising Provider    Answer:   BEDSOLE, AMY E [[id number]]   ipratropium (ATROVENT) 0.03 % nasal spray    Sig: Place 2 sprays into both nostrils 2 (two) times daily as needed for rhinitis.    Dispense:  30 mL    Refill:  0    Order Specific Question:   Supervising Provider    Answer:   BEDSOLE, AMY E [[id number]]   No orders of the defined types were placed in this encounter.   I discussed the assessment and treatment plan with the patient. The patient was provided an opportunity to ask questions and all were answered. The patient agreed with the plan and demonstrated an understanding of the  instructions. The patient was advised to call back or seek an in-person evaluation if the symptoms worsen or if the condition fails to improve as anticipated.  Follow up plan:  You may take Benzonatate capsules for cough. Take 1 capsule by mouth three times daily as needed for cough.  Use the ipratropium nasal spray as needed.  Please update me as discussed if your symptoms do not continue to improve.  It was a pleasure to see you today!    Pleas Koch, NP

## 2021-01-09 NOTE — Assessment & Plan Note (Addendum)
Appears well overall.  Do suspect viral etiology.   Rx for [employer] provided.  Use Coricidin PRN.   Reviewed chest xray from July 2021 and August 2022, July 2021 xray is negative. Suspect August 2022 results are secondary to Covid-19 infection which was occurring at the time. Discussed this with patient. Offered repeat chest xray for which she kindly declines.   Return precautions provided. She appears very well overall.

## 2021-01-09 NOTE — Assessment & Plan Note (Signed)
Refill provided for ipratropium nasal spray for which she uses sparingly.

## 2021-01-09 NOTE — Assessment & Plan Note (Signed)
Agree to hold off on nortriptyline for now, discussed to start if no improvement after wearing sunglasses.   Following with neurology now. Fortunately MRI brain was negative.

## 2021-01-09 NOTE — Patient Instructions (Signed)
You may take Benzonatate capsules for cough. Take 1 capsule by mouth three times daily as needed for cough.  Use the ipratropium nasal spray as needed.  Please update me as discussed if your symptoms do not continue to improve.  It was a pleasure to see you today!

## 2021-01-15 ENCOUNTER — Other Ambulatory Visit: Payer: Self-pay | Admitting: Primary Care

## 2021-01-15 DIAGNOSIS — M545 Low back pain, unspecified: Secondary | ICD-10-CM

## 2021-01-15 DIAGNOSIS — G8929 Other chronic pain: Secondary | ICD-10-CM

## 2021-01-15 MED ORDER — GABAPENTIN 100 MG PO CAPS: 200.0000 mg | ORAL_CAPSULE | Freq: Every day | ORAL | 1 refills | Status: DC

## 2021-01-23 ENCOUNTER — Ambulatory Visit (INDEPENDENT_AMBULATORY_CARE_PROVIDER_SITE_OTHER): Payer: Medicare Other | Admitting: Primary Care

## 2021-01-23 ENCOUNTER — Other Ambulatory Visit: Payer: Self-pay

## 2021-01-23 ENCOUNTER — Encounter: Payer: Self-pay | Admitting: Primary Care

## 2021-01-23 DIAGNOSIS — I480 Paroxysmal atrial fibrillation: Secondary | ICD-10-CM

## 2021-01-23 DIAGNOSIS — I10 Essential (primary) hypertension: Secondary | ICD-10-CM | POA: Diagnosis not present

## 2021-01-23 DIAGNOSIS — G43109 Migraine with aura, not intractable, without status migrainosus: Secondary | ICD-10-CM | POA: Diagnosis not present

## 2021-01-23 NOTE — Assessment & Plan Note (Signed)
Rate and rhythm regular today. Continue Eliquis 5 mg BID.  Following with cardiology. stressecho reviewed from September 2022

## 2021-01-23 NOTE — Patient Instructions (Signed)
We will see you in early 2023.  Keep me updated regarding your migraines!  It was a pleasure to see you today!

## 2021-01-23 NOTE — Assessment & Plan Note (Signed)
Controlled in the office today, continue amlodipine 2.5 mg daily.

## 2021-01-23 NOTE — Progress Notes (Signed)
Subjective:    Patient ID: Christie Wells, female    DOB: 26-May-1950, 70 y.o.   MRN: [id number]  HPI  Christie Wells is a very pleasant 70 y.o. female who presents today for follow up of chronic conditions.  1) Migraines: Chronic for years, dates back to teenage years, no problems until a few months ago. Over the last few months she's experienced multiple migraines without ability to completely abort.   Chronically managed on gabapentin 100-200 mg HS for migraine prevention (which also helps with chronic back pain), and sumatriptan 50 mg PRN, but over the last few months gabapentin and sumatriptan was not effective. She underwent MRI brain in October 2022 which was negative for acute intracranial abnormalities.   Evaluated by neurology in late October 2022 who recommended nortriptyline for which she never filled. She did visit her eye surgeon who completed her cataract surgery who suspected migraines were secondary to sunlight exposure given that cataracts were removed.   Her last migraine was two weeks ago. She did have an aura (cloudy vision), unilateral mostly) three days ago but never turned into a headache. Overall migraines have been less frequent.  She continues to take gabapentin 200 mg HS and sumatriptan 50 mg PRN. She continues to hold off on nortriptyline.   2) Paroxysmal Atrial Fibrillation: Following with cardiology through [facility], underwent stressecho test in September 2022 which was told everything "was fine". She is compliant to Eliqyis 5 mg BID, has noticed bruising to her arms. No falls.   She denies chest pain, palpitations, exertional SOB.  3) Essential Hypertension: Currently managed on amlodipine 2.5 mg daily.   BP Readings from Last 3 Encounters:  01/23/21 118/66  11/14/20 120/78  07/09/20 118/74   Wt Readings from Last 3 Encounters:  01/23/21 167 lb (75.8 kg)  01/09/21 171 lb (77.6 kg)  11/14/20 171 lb (77.6 kg)       Review of Systems  Eyes:  Negative  for visual disturbance.  Respiratory:  Negative for shortness of breath.   Cardiovascular:  Negative for chest pain.  Neurological:  Negative for dizziness.       See HPI for headaches        Past Medical History:  Diagnosis Date   Arthritis    Chronic headaches    Colitis 1984   GERD (gastroesophageal reflux disease)    Hypothyroidism    Migraines    TIA (transient ischemic attack)    UTI (urinary tract infection)     Social History   Socioeconomic History   Marital status: Divorced    Spouse name: Not on file   Number of children: 2   Years of education: Not on file   Highest education level: Not on file  Occupational History   Not on file  Tobacco Use   Smoking status: Never   Smokeless tobacco: Never  Vaping Use   Vaping Use: Never used  Substance and Sexual Activity   Alcohol use: Yes    Alcohol/week: 1.0 standard drink    Types: 1 Shots of liquor per week   Drug use: No   Sexual activity: Not on file  Other Topics Concern   Not on file  Social History Narrative   Divorced.   2 children.   Moved from [place].   Once worked as an Journalist, newspaper.       Social Determinants of Health   Financial Resource Strain: Not on file  Food Insecurity: Not on file  Transportation Needs:  Not on file  Physical Activity: Not on file  Stress: Not on file  Social Connections: Not on file  Intimate Partner Violence: Not on file    Past Surgical History:  Procedure Laterality Date   BUNIONECTOMY Bilateral    CARDIAC ELECTROPHYSIOLOGY STUDY AND ABLATION  02/22/2020   COLONOSCOPY  2015   EXCISION OF BREAST BIOPSY Left    FOOT SURGERY Bilateral    5 screws    TUBAL LIGATION     UPPER GI ENDOSCOPY     Gastritis    Family History  Problem Relation Age of Onset   Alzheimer's disease Mother    Hypertension Mother    Hyperlipidemia Mother    COPD Mother    Aneurysm Mother    Stroke Father    Hypertension Father    Heart disease Maternal Grandmother     Parkinson's disease Paternal Grandfather    Aneurysm Brother    Colon cancer Neg Hx    Esophageal cancer Neg Hx     Allergies  Allergen Reactions   Codeine Nausea And Vomiting    Other reaction(s): vomiting    Peanut-Containing Drug Products     Headaches   Sulfa Antibiotics Rash    Other reaction(s): rash   Topamax [Topiramate] Other (See Comments)    Dizzy, numbness/tingling    Current Outpatient Medications on File Prior to Visit  Medication Sig Dispense Refill   albuterol (VENTOLIN HFA) 108 (90 Base) MCG/ACT inhaler Inhale 2 puffs into the lungs every 6 (six) hours as needed for shortness of breath. 1 each 0   amLODipine (NORVASC) 2.5 MG tablet TAKE 1 TABLET (2.5 MG TOTAL) BY MOUTH IN THE MORNING AND AT BEDTIME. FOR BLOOD PRESSURE. 180 tablet 1   apixaban (ELIQUIS) 5 MG TABS tablet Take 1 tablet (5 mg total) by mouth 2 (two) times daily. 180 tablet 3   celecoxib (CELEBREX) 100 MG capsule Take 100 mg by mouth 2 (two) times daily.     cholecalciferol (VITAMIN D) 1000 units tablet Take 3,000 Units by mouth daily.     cyanocobalamin 1000 MCG tablet Take 1,000 mcg by mouth daily.     gabapentin (NEURONTIN) 100 MG capsule Take 2 capsules (200 mg total) by mouth at bedtime. For back pain and headache prevention. 180 capsule 1   hydrOXYzine (ATARAX/VISTARIL) 10 MG tablet TAKE 1-2 TABLETS (10-20 MG TOTAL) BY MOUTH 2 (TWO) TIMES DAILY AS NEEDED FOR ANXIETY. 90 tablet 0   lansoprazole (PREVACID) 30 MG capsule Take 1 capsule (30 mg total) by mouth 2 (two) times daily before a meal. For heartburn. 180 capsule 1   levothyroxine (SYNTHROID) 88 MCG tablet TAKE 1 TAB EVERY MORNING ON AN EMPTY STOMACH WITH WATER ONLY. NO FOOD OR OTHER MEDS FOR 30 MIN 90 tablet 3   SUMAtriptan (IMITREX) 50 MG tablet 1 TABLET BY MOUTH AT MIGRAINE ONSET MAY REPEAT WITH SECOND TABLET IN 2 HOURS IF MIGRAINE PERSIST 10 tablet 0   No current facility-administered medications on file prior to visit.    BP 118/66    Pulse 67   Temp (!) 97 F (36.1 C) (Temporal)   Ht 5\' 6"  (1.676 m)   Wt 167 lb (75.8 kg)   SpO2 97%   BMI 26.95 kg/m  Objective:   Physical Exam Cardiovascular:     Rate and Rhythm: Normal rate and regular rhythm.  Pulmonary:     Effort: Pulmonary effort is normal.     Breath sounds: Normal breath sounds.  Musculoskeletal:  Cervical back: Neck supple.  Skin:    General: Skin is warm and dry.          Assessment & Plan:      This visit occurred during the SARS-CoV-2 public health emergency.  Safety protocols were in place, including screening questions prior to the visit, additional usage of staff PPE, and extensive cleaning of exam room while observing appropriate contact time as indicated for disinfecting solutions.

## 2021-01-23 NOTE — Assessment & Plan Note (Signed)
Gradually improving.  Continue gabapentin 200 mg HS. Continue PRN sumatriptan 50 mg.  Hold off on nortriptyline for now.

## 2021-01-31 ENCOUNTER — Other Ambulatory Visit: Payer: Self-pay | Admitting: Primary Care

## 2021-01-31 DIAGNOSIS — J302 Other seasonal allergic rhinitis: Secondary | ICD-10-CM

## 2021-03-12 ENCOUNTER — Other Ambulatory Visit: Payer: Self-pay | Admitting: Primary Care

## 2021-03-12 DIAGNOSIS — K219 Gastro-esophageal reflux disease without esophagitis: Secondary | ICD-10-CM

## 2021-03-24 ENCOUNTER — Other Ambulatory Visit: Payer: Self-pay | Admitting: Primary Care

## 2021-03-24 DIAGNOSIS — Z1231 Encounter for screening mammogram for malignant neoplasm of breast: Secondary | ICD-10-CM

## 2021-04-08 ENCOUNTER — Encounter: Payer: Self-pay | Admitting: Primary Care

## 2021-04-08 ENCOUNTER — Ambulatory Visit (INDEPENDENT_AMBULATORY_CARE_PROVIDER_SITE_OTHER): Payer: Medicare Other | Admitting: Primary Care

## 2021-04-08 ENCOUNTER — Other Ambulatory Visit: Payer: Self-pay

## 2021-04-08 VITALS — BP 118/68 | HR 81 | Temp 98.7°F | Ht 66.0 in | Wt 165.0 lb

## 2021-04-08 DIAGNOSIS — I4892 Unspecified atrial flutter: Secondary | ICD-10-CM

## 2021-04-08 DIAGNOSIS — E039 Hypothyroidism, unspecified: Secondary | ICD-10-CM | POA: Diagnosis not present

## 2021-04-08 DIAGNOSIS — E785 Hyperlipidemia, unspecified: Secondary | ICD-10-CM | POA: Diagnosis not present

## 2021-04-08 DIAGNOSIS — G43109 Migraine with aura, not intractable, without status migrainosus: Secondary | ICD-10-CM

## 2021-04-08 DIAGNOSIS — I10 Essential (primary) hypertension: Secondary | ICD-10-CM | POA: Diagnosis not present

## 2021-04-08 DIAGNOSIS — M545 Low back pain, unspecified: Secondary | ICD-10-CM

## 2021-04-08 DIAGNOSIS — Z0000 Encounter for general adult medical examination without abnormal findings: Secondary | ICD-10-CM

## 2021-04-08 DIAGNOSIS — G8929 Other chronic pain: Secondary | ICD-10-CM

## 2021-04-08 DIAGNOSIS — E2839 Other primary ovarian failure: Secondary | ICD-10-CM

## 2021-04-08 DIAGNOSIS — K219 Gastro-esophageal reflux disease without esophagitis: Secondary | ICD-10-CM

## 2021-04-08 DIAGNOSIS — I480 Paroxysmal atrial fibrillation: Secondary | ICD-10-CM

## 2021-04-08 DIAGNOSIS — F419 Anxiety disorder, unspecified: Secondary | ICD-10-CM

## 2021-04-08 LAB — COMPREHENSIVE METABOLIC PANEL
ALT: 11 U/L (ref 0–35)
AST: 15 U/L (ref 0–37)
Albumin: 4.3 g/dL (ref 3.5–5.2)
Alkaline Phosphatase: 87 U/L (ref 39–117)
BUN: 16 mg/dL (ref 6–23)
CO2: 30 mEq/L (ref 19–32)
Calcium: 9.6 mg/dL (ref 8.4–10.5)
Chloride: 102 mEq/L (ref 96–112)
Creatinine, Ser: 0.99 mg/dL (ref 0.40–1.20)
GFR: 58.28 mL/min — ABNORMAL LOW (ref >60.00)
Glucose, Bld: 79 mg/dL (ref 70–99)
Potassium: 3.7 mEq/L (ref 3.5–5.1)
Sodium: 141 mEq/L (ref 135–145)
Total Bilirubin: 0.9 mg/dL (ref 0.2–1.2)
Total Protein: 6.8 g/dL (ref 6.0–8.3)

## 2021-04-08 LAB — LIPID PANEL
Cholesterol: 217 mg/dL — ABNORMAL HIGH (ref 0–200)
HDL: 58.9 mg/dL (ref >39.00)
LDL Cholesterol: 129 mg/dL — ABNORMAL HIGH (ref 0–99)
NonHDL: 158.59
Total CHOL/HDL Ratio: 4
Triglycerides: 148 mg/dL (ref 0.0–149.0)
VLDL: 29.6 mg/dL (ref 0.0–40.0)

## 2021-04-08 LAB — HEMOGLOBIN A1C: Hgb A1c MFr Bld: 5.2 % (ref 4.6–6.5)

## 2021-04-08 LAB — TSH: TSH: 0.84 u[IU]/mL (ref 0.35–5.50)

## 2021-04-08 NOTE — Patient Instructions (Signed)
Stop by the lab prior to leaving today. I will notify you of your results once received.   [facility] to schedule your bone density scan.  It was a pleasure to see you today!  Preventive Care 47 Years and Older, Female Preventive care refers to lifestyle choices and visits with your health care provider that can promote health and wellness. Preventive care visits are also called wellness exams. What can I expect for my preventive care visit? Counseling Your health care provider may ask you questions about your: Medical history, including: Past medical problems. Family medical history. Pregnancy and menstrual history. History of falls. Current health, including: Memory and ability to understand (cognition). Emotional well-being. Home life and relationship well-being. Sexual activity and sexual health. Lifestyle, including: Alcohol, nicotine or tobacco, and drug use. Access to firearms. Diet, exercise, and sleep habits. Work and work Statistician. Sunscreen use. Safety issues such as seatbelt and bike helmet use. Physical exam Your health care provider will check your: Height and weight. These may be used to calculate your BMI (body mass index). BMI is a measurement that tells if you are at a healthy weight. Waist circumference. This measures the distance around your waistline. This measurement also tells if you are at a healthy weight and may help predict your risk of certain diseases, such as type 2 diabetes and high blood pressure. Heart rate and blood pressure. Body temperature. Skin for abnormal spots. What immunizations do I need? Vaccines are usually given at various ages, according to a schedule. Your health care provider will recommend vaccines for you based on your age, medical history, and lifestyle or other factors, such as travel or where you work. What tests do I need? Screening Your health care provider may recommend screening tests for certain  conditions. This may include: Lipid and cholesterol levels. Hepatitis C test. Hepatitis B test. HIV (human immunodeficiency virus) test. STI (sexually transmitted infection) testing, if you are at risk. Lung cancer screening. Colorectal cancer screening. Diabetes screening. This is done by checking your blood sugar (glucose) after you have not eaten for a while (fasting). Mammogram. Talk with your health care provider about how often you should have regular mammograms. BRCA-related cancer screening. This may be done if you have a family history of breast, ovarian, tubal, or peritoneal cancers. Bone density scan. This is done to screen for osteoporosis. Talk with your health care provider about your test results, treatment options, and if necessary, the need for more tests. Follow these instructions at home: Eating and drinking  Eat a diet that includes fresh fruits and vegetables, whole grains, lean protein, and low-fat dairy products. Limit your intake of foods with high amounts of sugar, saturated fats, and salt. Take vitamin and mineral supplements as recommended by your health care provider. Do not drink alcohol if your health care provider tells you not to drink. If you drink alcohol: Limit how much you have to 0-1 drink a day. Know how much alcohol is in your drink. In the [place] one drink equals one 12 oz bottle of beer (355 mL), one 5 oz glass of wine (148 mL), or one 1 oz glass of hard liquor (44 mL). Lifestyle Brush your teeth every morning and night with fluoride toothpaste. Floss one time each day. Exercise for at least 30 minutes 5 or more days each week. Do not use any products that contain nicotine or tobacco. These products include cigarettes, chewing tobacco, and vaping devices, such as e-cigarettes. If you need  help quitting, ask your health care provider. Do not use drugs. If you are sexually active, practice safe sex. Use a condom or other form of protection in order to  prevent STIs. Take aspirin only as told by your health care provider. Make sure that you understand how much to take and what form to take. Work with your health care provider to find out whether it is safe and beneficial for you to take aspirin daily. Ask your health care provider if you need to take a cholesterol-lowering medicine (statin). Find healthy ways to manage stress, such as: Meditation, yoga, or listening to music. Journaling. Talking to a trusted person. Spending time with friends and family. Minimize exposure to UV radiation to reduce your risk of skin cancer. Safety Always wear your seat belt while driving or riding in a vehicle. Do not drive: If you have been drinking alcohol. Do not ride with someone who has been drinking. When you are tired or distracted. While texting. If you have been using any mind-altering substances or drugs. Wear a helmet and other protective equipment during sports activities. If you have firearms in your house, make sure you follow all gun safety procedures. What's next? Visit your health care provider once a year for an annual wellness visit. Ask your health care provider how often you should have your eyes and teeth checked. Stay up to date on all vaccines. This information is not intended to replace advice given to you by your health care provider. Make sure you discuss any questions you have with your health care provider. Document Revised: 08/28/2020 Document Reviewed: 08/28/2020 Elsevier Patient Education  [place].

## 2021-04-08 NOTE — Assessment & Plan Note (Addendum)
No longer taking gabapentin 200 mg HS per patient. She will update [place] once she gets home, is on new medication.  Continue Celebrex 100 mg BID PRN

## 2021-04-08 NOTE — Progress Notes (Signed)
Subjective:    Patient ID: Christie Wells, female    DOB: 1951-03-05, 71 y.o.   MRN: [id number]  HPI  Christie Wells is a very pleasant 71 y.o. female who presents today for complete physical and follow up of chronic conditions.  Immunizations: -Tetanus: 2016 -Influenza: Completed this season  -Covid-19: 4 vaccines  -Shingles: Completed Zostavax and Shingrix -Pneumonia: Prevnar 13 in 2020, Pneumovax in 2021  Diet: [place].  Exercise: Regular exercise, swimming and golfing.   Eye exam: Completes annually  Dental exam: Completes semi-annually   Mammogram: Completed in July 2021, scheduled for February 2023 Dexa: Completed in 2019 Colonoscopy: Completed in 2020, due January 2023  BP Readings from Last 3 Encounters:  04/08/21 118/68  01/23/21 118/66  11/14/20 120/78        Review of Systems  Constitutional:  Negative for unexpected weight change.  HENT:  Negative for rhinorrhea.   Respiratory:  Negative for cough and shortness of breath.   Cardiovascular:  Negative for chest pain.  Gastrointestinal:  Positive for constipation. Negative for diarrhea.  Genitourinary:  Negative for difficulty urinating.  Musculoskeletal:  Positive for arthralgias and back pain.  Skin:  Negative for rash.  Allergic/Immunologic: Negative for environmental allergies.  Neurological:  Positive for headaches. Negative for dizziness and numbness.  Psychiatric/Behavioral:  The patient is not nervous/anxious.         Past Medical History:  Diagnosis Date   Arthritis    Chronic headaches    Colitis 1984   GERD (gastroesophageal reflux disease)    Hypothyroidism    Migraines    TIA (transient ischemic attack)    UTI (urinary tract infection)     Social History   Socioeconomic History   Marital status: Divorced    Spouse name: Not on file   Number of children: 2   Years of education: Not on file   Highest education level: Not on file  Occupational History   Not on file   Tobacco Use   Smoking status: Never   Smokeless tobacco: Never  Vaping Use   Vaping Use: Never used  Substance and Sexual Activity   Alcohol use: Yes    Alcohol/week: 1.0 standard drink    Types: 1 Shots of liquor per week   Drug use: No   Sexual activity: Not on file  Other Topics Concern   Not on file  Social History Narrative   Divorced.   2 children.   Moved from [place].   Once worked as an Journalist, newspaper.       Social Determinants of Health   Financial Resource Strain: Not on file  Food Insecurity: Not on file  Transportation Needs: Not on file  Physical Activity: Not on file  Stress: Not on file  Social Connections: Not on file  Intimate Partner Violence: Not on file    Past Surgical History:  Procedure Laterality Date   BUNIONECTOMY Bilateral    CARDIAC ELECTROPHYSIOLOGY STUDY AND ABLATION  02/22/2020   COLONOSCOPY  2015   EXCISION OF BREAST BIOPSY Left    FOOT SURGERY Bilateral    5 screws    TUBAL LIGATION     UPPER GI ENDOSCOPY     Gastritis    Family History  Problem Relation Age of Onset   Alzheimer's disease Mother    Hypertension Mother    Hyperlipidemia Mother    COPD Mother    Aneurysm Mother    Stroke Father    Hypertension Father  Heart disease Maternal Grandmother    Parkinson's disease Paternal Grandfather    Aneurysm Brother    Colon cancer Neg Hx    Esophageal cancer Neg Hx     Allergies  Allergen Reactions   Codeine Nausea And Vomiting    Other reaction(s): vomiting    Peanut-Containing Drug Products     Headaches   Sulfa Antibiotics Rash    Other reaction(s): rash   Topamax [Topiramate] Other (See Comments)    Dizzy, numbness/tingling    Current Outpatient Medications on File Prior to Visit  Medication Sig Dispense Refill   amLODipine (NORVASC) 2.5 MG tablet TAKE 1 TABLET (2.5 MG TOTAL) BY MOUTH IN THE MORNING AND AT BEDTIME. FOR BLOOD PRESSURE. 180 tablet 1   apixaban (ELIQUIS) 5 MG TABS tablet Take 1  tablet (5 mg total) by mouth 2 (two) times daily. 180 tablet 3   celecoxib (CELEBREX) 100 MG capsule Take 100 mg by mouth 2 (two) times daily.     cholecalciferol (VITAMIN D) 1000 units tablet Take 3,000 Units by mouth daily.     cyanocobalamin 1000 MCG tablet Take 1,000 mcg by mouth daily.     gabapentin (NEURONTIN) 100 MG capsule Take 2 capsules (200 mg total) by mouth at bedtime. For back pain and headache prevention. 180 capsule 1   hydrOXYzine (ATARAX/VISTARIL) 10 MG tablet TAKE 1-2 TABLETS (10-20 MG TOTAL) BY MOUTH 2 (TWO) TIMES DAILY AS NEEDED FOR ANXIETY. 90 tablet 0   lansoprazole (PREVACID) 30 MG capsule TAKE 1 CAPSULE TWICE A DAY BEFORE MEALS FOR HEARTBURN 180 capsule 0   levothyroxine (SYNTHROID) 88 MCG tablet TAKE 1 TAB EVERY MORNING ON AN EMPTY STOMACH WITH WATER ONLY. NO FOOD OR OTHER MEDS FOR 30 MIN 90 tablet 3   nortriptyline (PAMELOR) 10 MG capsule Take by mouth.     rizatriptan (MAXALT) 10 MG tablet PLEASE SEE ATTACHED FOR DETAILED DIRECTIONS     SUMAtriptan (IMITREX) 50 MG tablet 1 TABLET BY MOUTH AT MIGRAINE ONSET MAY REPEAT WITH SECOND TABLET IN 2 HOURS IF MIGRAINE PERSIST 10 tablet 0   albuterol (VENTOLIN HFA) 108 (90 Base) MCG/ACT inhaler Inhale 2 puffs into the lungs every 6 (six) hours as needed for shortness of breath. (Patient not taking: Reported on 04/08/2021) 1 each 0   ipratropium (ATROVENT) 0.03 % nasal spray Place 2 sprays into both nostrils every 12 (twelve) hours. (Patient not taking: Reported on 04/08/2021)     No current facility-administered medications on file prior to visit.    BP 118/68    Pulse 81    Temp 98.7 F (37.1 C) (Temporal)    Ht 5\' 6"  (1.676 m)    Wt 165 lb (74.8 kg)    SpO2 97%    BMI 26.63 kg/m  Objective:   Physical Exam HENT:     Right Ear: Tympanic membrane and ear canal normal.     Left Ear: Tympanic membrane and ear canal normal.     Nose: Nose normal.  Eyes:     Conjunctiva/sclera: Conjunctivae normal.     Pupils: Pupils are  equal, round, and reactive to light.  Neck:     Thyroid: No thyromegaly.  Cardiovascular:     Rate and Rhythm: Normal rate and regular rhythm.     Heart sounds: No murmur heard. Pulmonary:     Effort: Pulmonary effort is normal.     Breath sounds: Normal breath sounds. No rales.  Abdominal:     General: Bowel sounds are normal.  Palpations: Abdomen is soft.     Tenderness: There is no abdominal tenderness.  Musculoskeletal:        General: Normal range of motion.     Cervical back: Neck supple.  Lymphadenopathy:     Cervical: No cervical adenopathy.  Skin:    General: Skin is warm and dry.     Findings: No rash.  Neurological:     Mental Status: She is alert and oriented to person, place, and time.     Cranial Nerves: No cranial nerve deficit.     Deep Tendon Reflexes: Reflexes are normal and symmetric.  Psychiatric:        Mood and Affect: Mood normal.          Assessment & Plan:      This visit occurred during the SARS-CoV-2 public health emergency.  Safety protocols were in place, including screening questions prior to the visit, additional usage of staff PPE, and extensive cleaning of exam room while observing appropriate contact time as indicated for disinfecting solutions.

## 2021-04-08 NOTE — Assessment & Plan Note (Signed)
Asymptomatic. Regular rate and rhythm today.  Continue Eliquis 5 mg BID. Following with cardiology.

## 2021-04-08 NOTE — Assessment & Plan Note (Signed)
Controlled in the office today.  Continue amlodipine 2.5 mg daily.

## 2021-04-08 NOTE — Assessment & Plan Note (Signed)
Immunizations UTD.  Mammogram due and is scheduled.  Bone density scan due, orders placed.  Colonoscopy appears to be due either now or in 2025.  Encouraged a healthy diet and regular exercise. Exam today stable. Labs pending.

## 2021-04-08 NOTE — Assessment & Plan Note (Signed)
Improving. Following with neurology.  Continue nortriptyline 20 mg daily, sumatriptan 50 mg PRN. She does have rizatriptan 10 mg when her sumatriptan runs out, just as a back up.  She understands not to take both sumatriptan and rizatriptan.

## 2021-04-08 NOTE — Assessment & Plan Note (Signed)
Not on treatment, has declined years prior.  Repeat lipid panel pending.

## 2021-04-08 NOTE — Assessment & Plan Note (Signed)
Controlled.  Continue lansoprazole 30 mg BID.

## 2021-04-08 NOTE — Assessment & Plan Note (Signed)
She is taking levothyroxine 88 mcg correctly.  Continue levothyroxine 88 mcg daily. Repeat TSH pending.

## 2021-04-08 NOTE — Assessment & Plan Note (Signed)
Stable, no concerns today.  Continue hydroxyzine 10 mg PRN.  No recent use.

## 2021-04-23 ENCOUNTER — Ambulatory Visit
Admission: RE | Admit: 2021-04-23 | Discharge: 2021-04-23 | Disposition: A | Discharge status: Home / Self Care | Payer: Medicare Other | Source: Ambulatory Visit | Attending: Primary Care | Admitting: Primary Care

## 2021-04-23 ENCOUNTER — Other Ambulatory Visit: Payer: Self-pay

## 2021-04-23 DIAGNOSIS — Z1231 Encounter for screening mammogram for malignant neoplasm of breast: Secondary | ICD-10-CM | POA: Diagnosis not present

## 2021-04-23 IMAGING — MG MM DIGITAL SCREENING BILAT W/ TOMO AND CAD
8 series · 8 of 24 positions shown · non-contrast
Comparison: Previous exam(s).

CLINICAL DATA: Screening.

EXAM:
DIGITAL SCREENING BILATERAL MAMMOGRAM WITH TOMOSYNTHESIS AND CAD
TECHNIQUE: Bilateral screening digital craniocaudal and mediolateral oblique
mammograms were obtained. Bilateral screening digital breast
tomosynthesis was performed. The images were evaluated with
computer-aided detection.

[R MLO synth-2D]
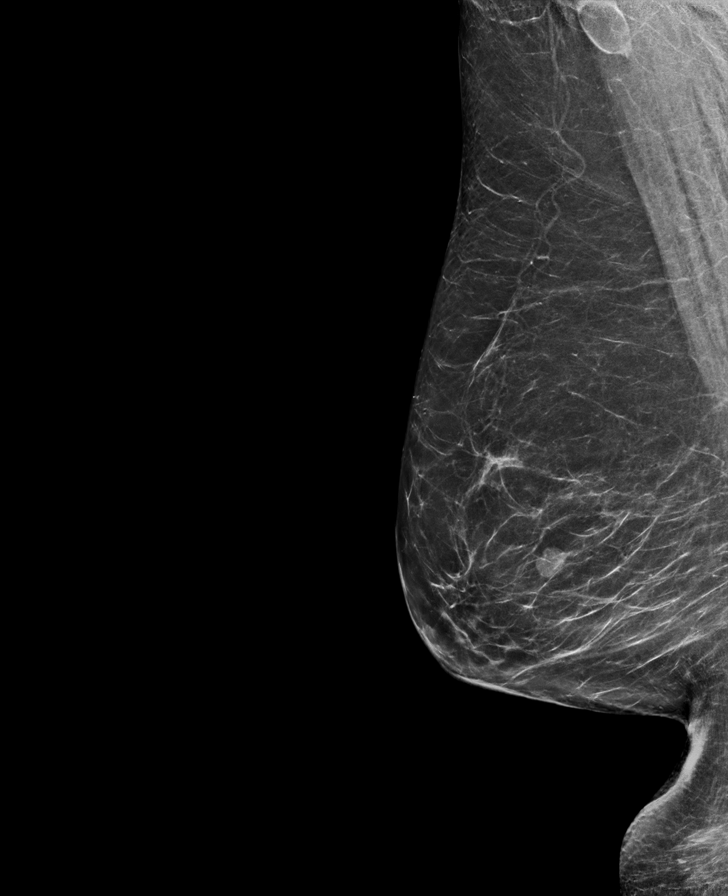

[L MLO synth-2D]
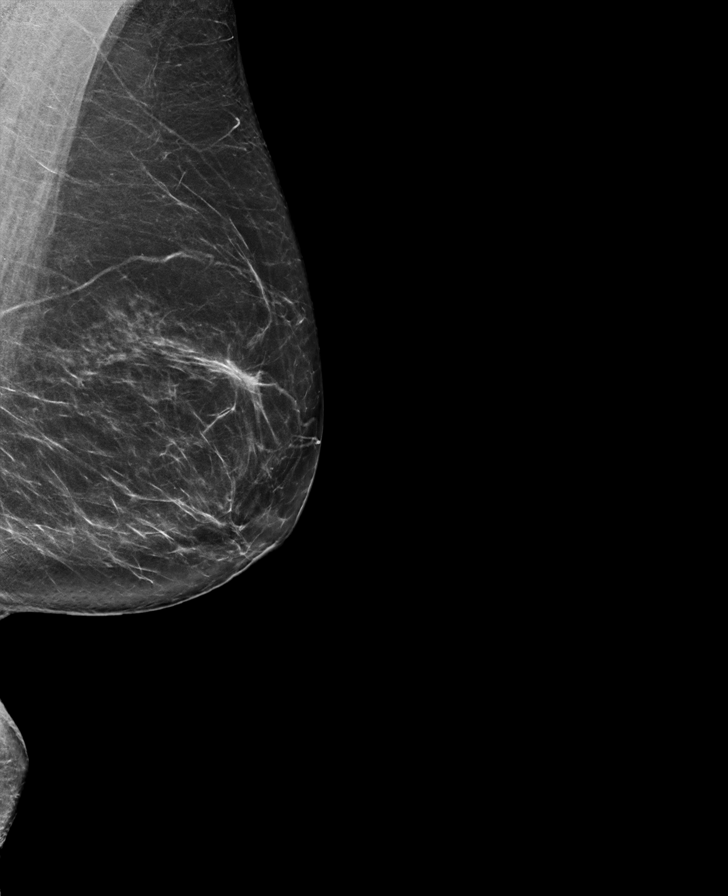

[L CC synth-2D]
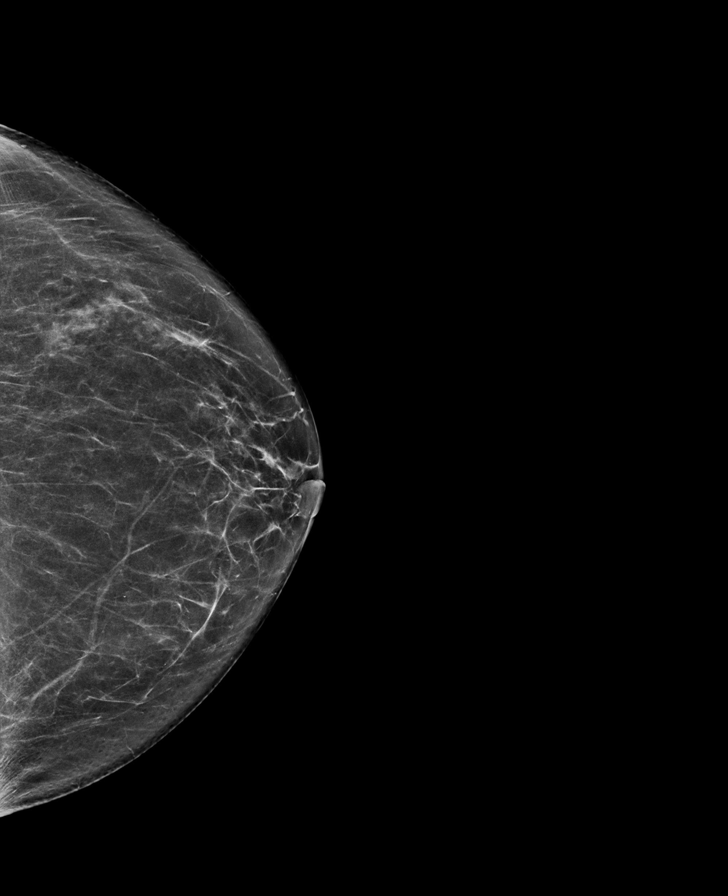

[R CC synth-2D]
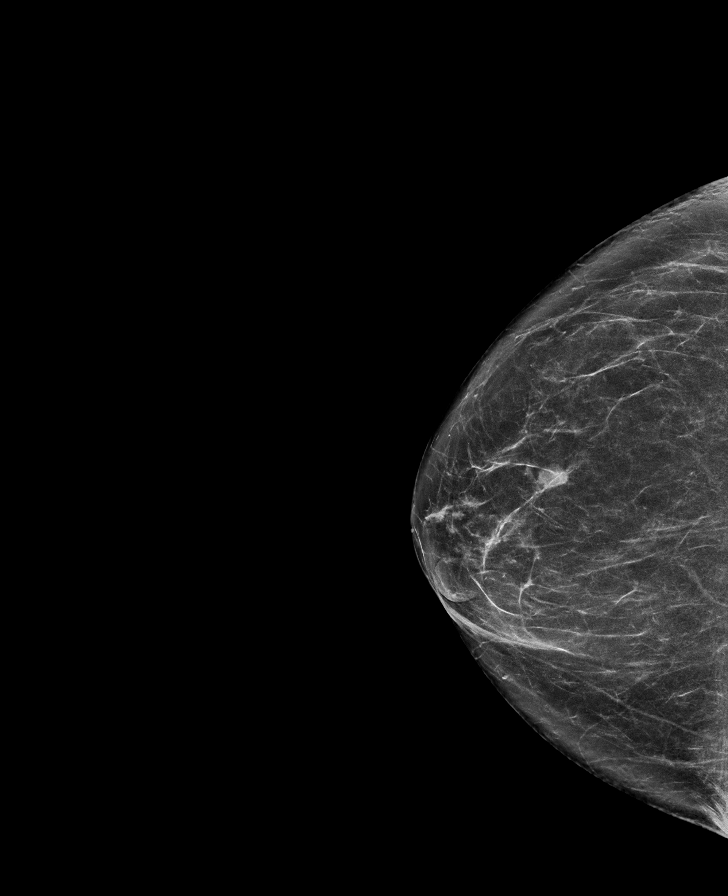

[L MLO tomo · tomo slice 37/74.0]
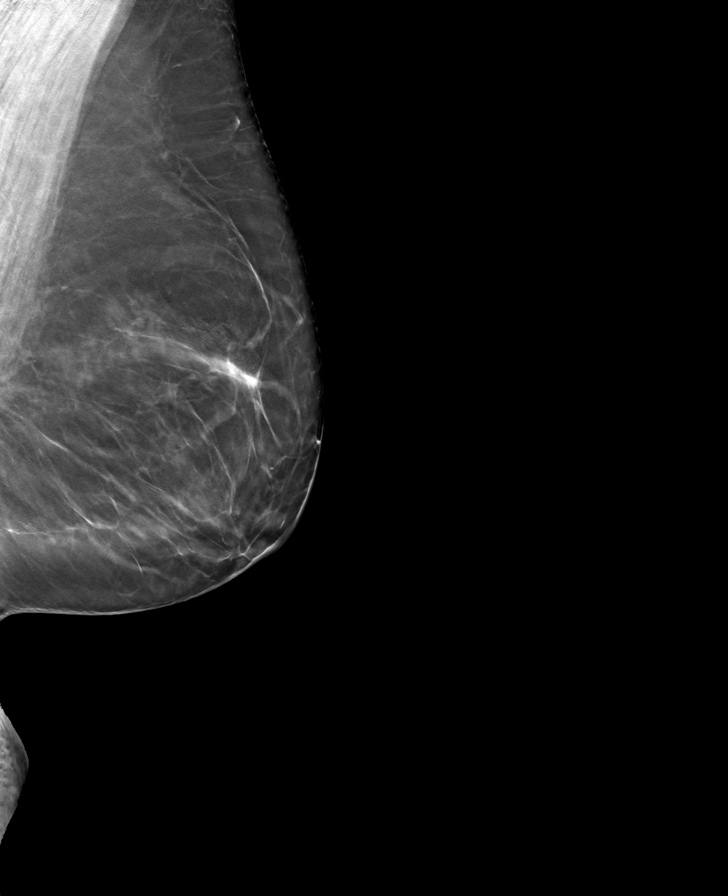

[L CC tomo · tomo slice 35/69.0]
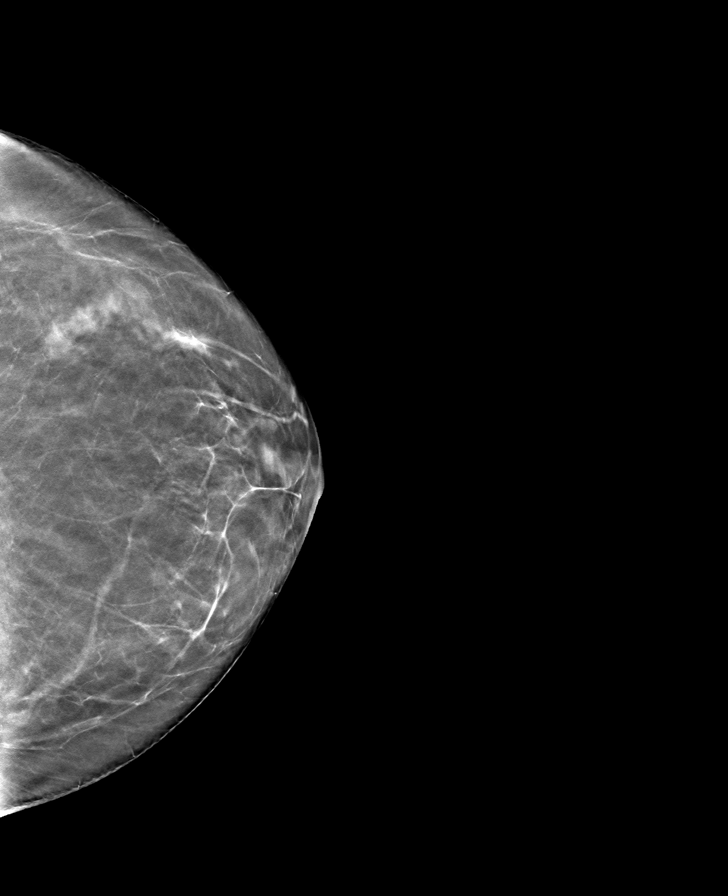

[R MLO tomo · tomo slice 37/73.0]
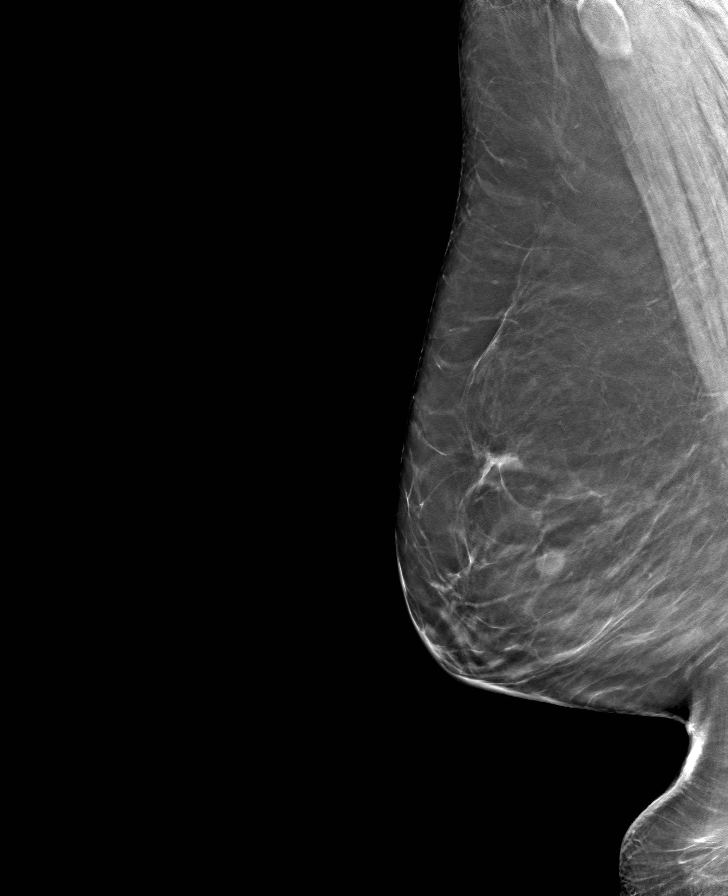

[R CC tomo · tomo slice 35/69.0]
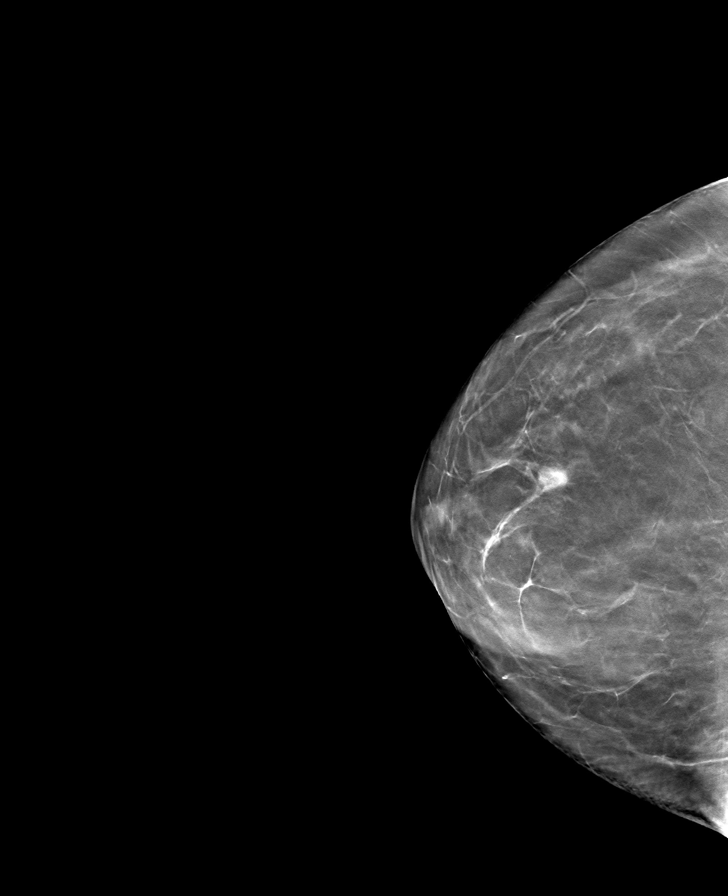

[8 of 24 positions shown; findings below may reference images not displayed]

ACR Breast Density Category b: There are scattered areas of
fibroglandular density.
FINDINGS: There are no findings suspicious for malignancy.
IMPRESSION: No mammographic evidence of malignancy. A result letter of this
screening mammogram will be mailed directly to the patient.

RECOMMENDATION:
Screening mammogram in one year. (Code:[BY])

BI-RADS CATEGORY  1: Negative.

## 2021-05-12 ENCOUNTER — Encounter: Payer: Self-pay | Admitting: Gastroenterology

## 2021-05-13 ENCOUNTER — Telehealth: Payer: Self-pay | Admitting: Primary Care

## 2021-05-13 DIAGNOSIS — I10 Essential (primary) hypertension: Secondary | ICD-10-CM

## 2021-05-13 MED ORDER — AMLODIPINE BESYLATE 2.5 MG PO TABS: 2.5000 mg | ORAL_TABLET | Freq: Two times a day (BID) | ORAL | 3 refills | Status: DC

## 2021-05-13 NOTE — Addendum Note (Signed)
Addended by: Pleas Koch on: 05/13/2021 07:01 PM   Modules accepted: Orders

## 2021-05-13 NOTE — Telephone Encounter (Signed)
Refills sent to pharmacy. 

## 2021-05-13 NOTE — Telephone Encounter (Signed)
°  Encourage patient to contact the pharmacy for refills or they can request refills through [facility]:  Please schedule appointment if longer than 1 year  NEXT APPOINTMENT DATE:  MEDICATION:   amLODipine (NORVASC) 2.5 MG tablet    Is the patient out of medication? yes  [facility]  Let patient know to contact pharmacy at the end of the day to make sure medication is ready.  Please notify patient to allow 48-72 hours to process  CLINICAL FILLS OUT ALL BELOW:   LAST REFILL:  QTY:  REFILL DATE:    OTHER COMMENTS:    Okay for refill?  Please advise

## 2021-05-19 DIAGNOSIS — I10 Essential (primary) hypertension: Secondary | ICD-10-CM

## 2021-05-20 MED ORDER — AMLODIPINE BESYLATE 2.5 MG PO TABS: 2.5000 mg | ORAL_TABLET | Freq: Two times a day (BID) | ORAL | 2 refills | Status: DC

## 2021-06-09 ENCOUNTER — Other Ambulatory Visit: Payer: Self-pay | Admitting: Primary Care

## 2021-06-09 DIAGNOSIS — I480 Paroxysmal atrial fibrillation: Secondary | ICD-10-CM

## 2021-06-10 ENCOUNTER — Other Ambulatory Visit: Payer: Self-pay | Admitting: Primary Care

## 2021-06-10 DIAGNOSIS — K219 Gastro-esophageal reflux disease without esophagitis: Secondary | ICD-10-CM

## 2021-06-16 ENCOUNTER — Telehealth: Payer: Self-pay | Admitting: Primary Care

## 2021-06-16 NOTE — Telephone Encounter (Signed)
LVM for pt to rtn my call to schedule AWV with [facility]. Please schedule this appt if pt calls the office.  °

## 2021-06-19 ENCOUNTER — Other Ambulatory Visit: Payer: Self-pay | Admitting: Primary Care

## 2021-06-19 DIAGNOSIS — E039 Hypothyroidism, unspecified: Secondary | ICD-10-CM

## 2021-07-15 ENCOUNTER — Encounter: Payer: Self-pay | Admitting: Family

## 2021-07-15 ENCOUNTER — Ambulatory Visit (INDEPENDENT_AMBULATORY_CARE_PROVIDER_SITE_OTHER): Payer: Medicare Other | Admitting: Family

## 2021-07-15 VITALS — BP 122/80 | HR 71 | Temp 98.2°F | Resp 16 | Ht 66.0 in | Wt 163.2 lb

## 2021-07-15 DIAGNOSIS — H93A3 Pulsatile tinnitus, bilateral: Secondary | ICD-10-CM

## 2021-07-15 DIAGNOSIS — J302 Other seasonal allergic rhinitis: Secondary | ICD-10-CM | POA: Diagnosis not present

## 2021-07-15 MED ORDER — FLUTICASONE PROPIONATE 50 MCG/ACT NA SUSP: 2.0000 | Freq: Every day | NASAL | 6 refills | Status: DC

## 2021-07-15 NOTE — Assessment & Plan Note (Signed)
Recommend daily flonase to start to see if tinnitus improves.  ?

## 2021-07-15 NOTE — Progress Notes (Signed)
? ?Established Patient Office Visit ? ?Subjective:  ?Patient ID: Christie Wells, female    DOB: [birth year]  Age: 71 y.o. MRN: [id number] ? ?CC:  ?Chief Complaint  ?Patient presents with  ?? Ear Fullness  ?  Pulsing in ear X 2 weeks.  ? ? ?HPI ?Christie Wells is here today with concerns.  ? ?About three weeks ago pt noted when she lies down she feels her heartbeat in her left ear. Feelings the beating anytime she lies down. Having to sleep sitting up so that she doesn't hear it, and admits to taking leftover 100 mg gabapentin at night to fall asleep. ? ?Last neurology visit increased nortriptyline to 40 mg nightly but pt did not do this.  ?Did play golf yesterday, has been able to continue IADLs ? ? ? ?Past Medical History:  ?Diagnosis Date  ?? Arthritis   ?? Chronic headaches   ?? Colitis 1984  ?? COVID-19 virus infection 10/10/2020  ?? GERD (gastroesophageal reflux disease)   ?? Hypothyroidism   ?? Migraines   ?? TIA (transient ischemic attack)   ?? UTI (urinary tract infection)   ? ? ?Past Surgical History:  ?Procedure Laterality Date  ?? BUNIONECTOMY Bilateral   ?? CARDIAC ELECTROPHYSIOLOGY STUDY AND ABLATION  02/22/2020  ?? COLONOSCOPY  2015  ?? FOOT SURGERY Bilateral   ? 5 screws   ?? TUBAL LIGATION    ?? UPPER GI ENDOSCOPY    ? Gastritis  ? ? ?Family History  ?Problem Relation Age of Onset  ?? Alzheimer's disease Mother   ?? Hypertension Mother   ?? Hyperlipidemia Mother   ?? COPD Mother   ?? Aneurysm Mother   ?? Stroke Father   ?? Hypertension Father   ?? Heart disease Maternal Grandmother   ?? Parkinson's disease Paternal Grandfather   ?? Aneurysm Brother   ?? Colon cancer Neg Hx   ?? Esophageal cancer Neg Hx   ?? Breast cancer Neg Hx   ? ? ?Social History  ? ?Socioeconomic History  ?? Marital status: Divorced  ?  Spouse name: Not on file  ?? Number of children: 2  ?? Years of education: Not on file  ?? Highest education level: Not on file  ?Occupational History  ?? Not on file  ?Tobacco Use  ?? Smoking  status: Never  ?? Smokeless tobacco: Never  ?Vaping Use  ?? Vaping Use: Never used  ?Substance and Sexual Activity  ?? Alcohol use: Yes  ?  Alcohol/week: 1.0 standard drink  ?  Types: 1 Shots of liquor per week  ?? Drug use: No  ?? Sexual activity: Not on file  ?Other Topics Concern  ?? Not on file  ?Social History Narrative  ? Divorced.  ? 2 children.  ? Moved from [place].  ? Once worked as an Journalist, newspaper.  ?    ? ?Social Determinants of Health  ? ?Financial Resource Strain: Not on file  ?Food Insecurity: Not on file  ?Transportation Needs: Not on file  ?Physical Activity: Not on file  ?Stress: Not on file  ?Social Connections: Not on file  ?Intimate Partner Violence: Not on file  ? ? ?Outpatient Medications Prior to Visit  ?Medication Sig Dispense Refill  ?? amLODipine (NORVASC) 2.5 MG tablet Take 1 tablet (2.5 mg total) by mouth in the morning and at bedtime. For blood pressure. 180 tablet 2  ?? apixaban (ELIQUIS) 5 MG TABS tablet TAKE 1 TABLET TWICE A DAY 180 tablet 2  ?? celecoxib (CELEBREX) 100  MG capsule Take 100 mg by mouth 2 (two) times daily.    ?? cholecalciferol (VITAMIN D) 1000 units tablet Take 3,000 Units by mouth daily.    ?? cyanocobalamin 1000 MCG tablet Take 1,000 mcg by mouth daily.    ?? gabapentin (NEURONTIN) 100 MG capsule Take 100 mg by mouth at bedtime.    ?? hydrOXYzine (ATARAX/VISTARIL) 10 MG tablet TAKE 1-2 TABLETS (10-20 MG TOTAL) BY MOUTH 2 (TWO) TIMES DAILY AS NEEDED FOR ANXIETY. 90 tablet 0  ?? lansoprazole (PREVACID) 30 MG capsule TAKE 1 CAPSULE TWICE A DAY BEFORE MEALS FOR HEARTBURN 180 capsule 2  ?? levothyroxine (SYNTHROID) 88 MCG tablet TAKE 1 TAB EVERY MORNING ON AN EMPTY STOMACH WITH WATER ONLY. NO FOOD OR OTHER MEDS FOR 30 MIN 90 tablet 2  ?? nortriptyline (PAMELOR) 10 MG capsule Take by mouth.    ?? rizatriptan (MAXALT) 10 MG tablet PLEASE SEE ATTACHED FOR DETAILED DIRECTIONS    ?? SUMAtriptan (IMITREX) 50 MG tablet 1 TABLET BY MOUTH AT MIGRAINE ONSET MAY REPEAT  WITH SECOND TABLET IN 2 HOURS IF MIGRAINE PERSIST 10 tablet 0  ?? albuterol (VENTOLIN HFA) 108 (90 Base) MCG/ACT inhaler Inhale 2 puffs into the lungs every 6 (six) hours as needed for shortness of breath. (Patient not taking: Reported on 04/08/2021) 1 each 0  ?? ipratropium (ATROVENT) 0.03 % nasal spray Place 2 sprays into both nostrils every 12 (twelve) hours. (Patient not taking: Reported on 04/08/2021)    ? ?No facility-administered medications prior to visit.  ? ? ?Allergies  ?Allergen Reactions  ?? Codeine Nausea And Vomiting  ?  Other reaction(s): vomiting ?  ?? Peanut-Containing Drug Products   ?  Headaches  ?? Sulfa Antibiotics Rash  ?  Other reaction(s): rash  ?? Topamax [Topiramate] Other (See Comments)  ?  Dizzy, numbness/tingling  ? ? ?ROS ?Review of Systems  ?Constitutional:  Negative for chills and fever.  ?HENT:  Positive for tinnitus (hears heart in her left ear). Negative for congestion, ear pain, sinus pressure and sore throat.   ?Respiratory:  Negative for cough, shortness of breath and wheezing.   ?Cardiovascular:  Negative for chest pain and palpitations.  ? ?  ?Objective:  ?  ?Physical Exam ?Constitutional:   ?   General: She is not in acute distress. ?   Appearance: Normal appearance. She is normal weight. She is not ill-appearing, toxic-appearing or diaphoretic.  ?HENT:  ?   Right Ear: Hearing, ear canal and external ear normal. Tympanic membrane is retracted. Tympanic membrane is not erythematous or bulging.  ?   Left Ear: Hearing and external ear normal. A middle ear effusion (slight base of TM with clear effusion) is present. Tympanic membrane is not erythematous or bulging.  ?   Ears:  ?   Comments: Left ear canal with mild erythema ? ?   Nose: Rhinorrhea present. No nasal tenderness.  ?   Right Turbinates: Swollen.  ?   Left Turbinates: Swollen.  ?   Right Sinus: No maxillary sinus tenderness or frontal sinus tenderness.  ?   Left Sinus: No maxillary sinus tenderness or frontal sinus  tenderness.  ?   Mouth/Throat:  ?   Pharynx: Posterior oropharyngeal erythema (with cobblestoning of throat) present.  ?Cardiovascular:  ?   Rate and Rhythm: Normal rate and regular rhythm.  ?   Chest Wall: No thrill.  ?   Heart sounds: Normal heart sounds. No murmur heard. ?   Comments: No carotid bruits ?No jugular venous  distension ?Neurological:  ?   Mental Status: She is alert.  ? ? ?BP 122/80   Pulse 71   Temp 98.2 ?F (36.8 ?C)   Resp 16   Ht '5\' 6"'$  (1.676 m)   Wt 163 lb 4 oz (74 kg)   SpO2 98%   BMI 26.35 kg/m?  ?Wt Readings from Last 3 Encounters:  ?07/15/21 163 lb 4 oz (74 kg)  ?04/08/21 165 lb (74.8 kg)  ?01/23/21 167 lb (75.8 kg)  ? ? ? ?Health Maintenance Due  ?Topic Date Due  ?? COVID-19 Vaccine (5 - Booster for Pfizer series) 08/27/2020  ?? COLONOSCOPY (Pts 45-42yr Insurance coverage will need to be confirmed)  04/01/2021  ? ? ?There are no preventive care reminders to display for this patient. ? ?Lab Results  ?Component Value Date  ? TSH 0.84 04/08/2021  ? ?Lab Results  ?Component Value Date  ? WBC 11.6 (H) 10/13/2019  ? HGB 16.6 (H) 10/13/2019  ? HCT 46.4 (H) 10/13/2019  ? MCV 88.7 10/13/2019  ? PLT 216 10/13/2019  ? ?Lab Results  ?Component Value Date  ? NA 141 04/08/2021  ? K 3.7 04/08/2021  ? CO2 30 04/08/2021  ? GLUCOSE 79 04/08/2021  ? BUN 16 04/08/2021  ? CREATININE 0.99 04/08/2021  ? BILITOT 0.9 04/08/2021  ? ALKPHOS 87 04/08/2021  ? AST 15 04/08/2021  ? ALT 11 04/08/2021  ? PROT 6.8 04/08/2021  ? ALBUMIN 4.3 04/08/2021  ? CALCIUM 9.6 04/08/2021  ? ANIONGAP 11 10/13/2019  ? GFR 58.28 (L) 04/08/2021  ? ?Lab Results  ?Component Value Date  ? HGBA1C 5.2 04/08/2021  ? ? ?  ?Assessment & Plan:  ? ?Problem List Items Addressed This Visit   ? ?  ? Respiratory  ? Seasonal allergic rhinitis  ?  Recommend daily flonase to start to see if tinnitus improves.  ? ?  ?  ? Relevant Medications  ? fluticasone (FLONASE) 50 MCG/ACT nasal spray  ?  ? Nervous and Auditory  ? Pulsatile tinnitus of both ears  - Primary  ?  With PE finding possible exacerbation of allergic rhinitis ?Recommend daily flonase to see if improvement ?Referral to ENT as well for eval/treat ?Advise neurologist as well of new symptom onset.  ? ?Ad

## 2021-07-15 NOTE — Patient Instructions (Signed)
A referral was placed today to ENT.  ?Please let us know if you have not heard back within 2 weeks about the referral. ? ?Also suggest you advise your neurologist of pulsatile tinnitus.  ? ?Suspicion this may simply be allergies given redness of throat, evidence of post nasal drip as well as mild fluid left ear, suggest starting flonase nose spray once daily.  ? ?Due to recent changes in healthcare laws, you may see results of your imaging and/or laboratory studies on MyChart before I have had a chance to review them.  I understand that in some cases there may be results that are confusing or concerning to you. Please understand that not all results are received at the same time and often I may need to interpret multiple results in order to provide you with the best plan of care or course of treatment. Therefore, I ask that you please give me 2 business days to thoroughly review all your results before contacting my office for clarification. Should we see a critical lab result, you will be contacted sooner.  ? ?It was a pleasure seeing you today! Please do not hesitate to reach out with any questions and or concerns. ? ?Regards,  ? ?Arrianna Catala ?FNP-C ? ? ?

## 2021-07-15 NOTE — Assessment & Plan Note (Addendum)
With PE finding possible exacerbation of allergic rhinitis ?Recommend daily flonase to see if improvement ?Referral to ENT as well for eval/treat ?Advise neurologist as well of new symptom onset.  ? ?Advised pt to alert for s/s such as slurring speech, weakness of extremities, dropping of face/signs of stroke and if so call 911 or seek emergent care. No jugular vein distension, heart rate NSR no murmurs no carotid bruits. Pt otherwise appears healthy at current. ? ?Advised pt to stop gabapentin as no longer prescribed as with nortriptyline increase.  ?

## 2021-07-17 ENCOUNTER — Ambulatory Visit: Payer: Medicare Other | Admitting: Primary Care

## 2021-07-29 NOTE — Telephone Encounter (Signed)
Ashtyn, ? ?Can you take a look at the ENT referral that Tabatha placed on 07/15/2021? ? ?Thanks! ?Allie Bossier, NP-C ? ?

## 2021-07-30 ENCOUNTER — Telehealth: Payer: Self-pay | Admitting: Primary Care

## 2021-07-30 NOTE — Telephone Encounter (Signed)
?  Tried calling patient to  schedule Medicare Annual Wellness Visit (AWV) either virtually or phone ? ?Phone problems call never went through  sent my chart message  ? ? ?Awvi last WTM 10/05/17 ?please schedule at anytime with health coach ? ?This should be a 45 minute visit.   ? ?My direct # [phone] ?

## 2021-08-05 ENCOUNTER — Other Ambulatory Visit: Payer: Self-pay | Admitting: Physician Assistant

## 2021-08-05 DIAGNOSIS — H93A3 Pulsatile tinnitus, bilateral: Secondary | ICD-10-CM

## 2021-08-05 NOTE — Telephone Encounter (Signed)
Christie Wells, any chance we can get her in with ENT in [place] sooner than she's scheduled for mid July at [facility]?

## 2021-08-08 NOTE — Telephone Encounter (Signed)
Noted.  I know that Christie Wells has been overwhelmed with the significant number of referrals our practice generates.  I notified the patient that she would likely hear back by late next week.   Marc Morgans

## 2021-08-08 NOTE — Telephone Encounter (Signed)
Pt was calling "about her ENT appointment, has not heard back from the scheduler, she just wanted Anda Kraft to know this information."  Callback number: [phone]

## 2021-08-10 ENCOUNTER — Ambulatory Visit
Admission: RE | Admit: 2021-08-10 | Discharge: 2021-08-10 | Disposition: A | Discharge status: Home / Self Care | Payer: Medicare Other | Source: Ambulatory Visit | Attending: Physician Assistant | Admitting: Physician Assistant

## 2021-08-10 DIAGNOSIS — H93A3 Pulsatile tinnitus, bilateral: Secondary | ICD-10-CM | POA: Insufficient documentation

## 2021-08-10 IMAGING — MR MR MRA HEAD W/O CM
1 series · 27 of 48 positions shown · non-contrast
Comparison: None Available.

CLINICAL DATA: Pulsatile tinnitus, bilateral H93.A3 ([1T]-CM).

EXAM:
MRA HEAD WITHOUT CONTRAST
TECHNIQUE: Angiographic images of the Circle of Willis were acquired using MRA
technique without intravenous contrast.

[Series 5: TOF · axial · 0.5mm · 0.48mm/px · z∈[-74,+23]mm · 27 of 217 slices shown]
[im 1/217]
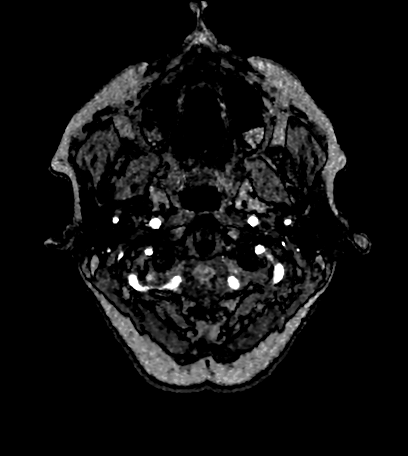
[im 5/217]
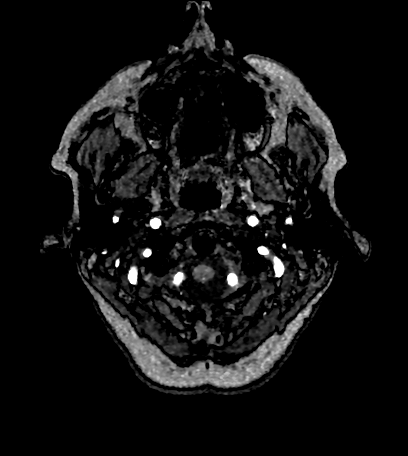
[im 10/217]
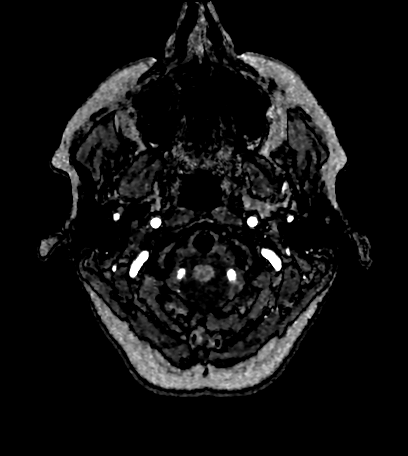
[im 14/217]
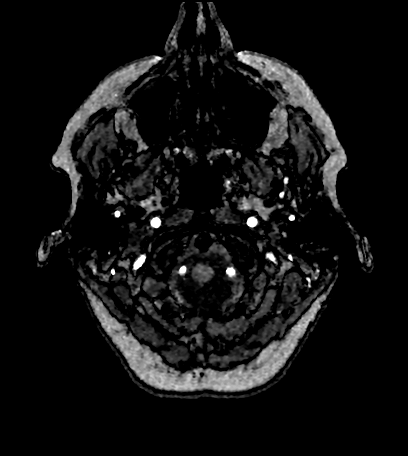
[im 19/217]
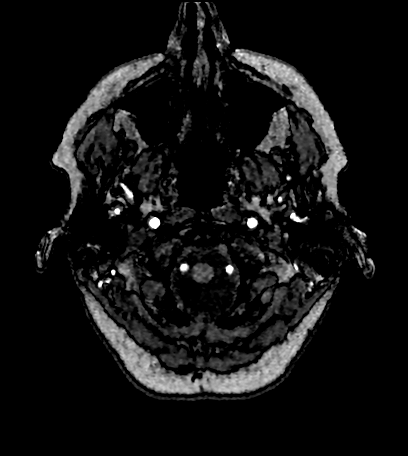
[im 23/217]
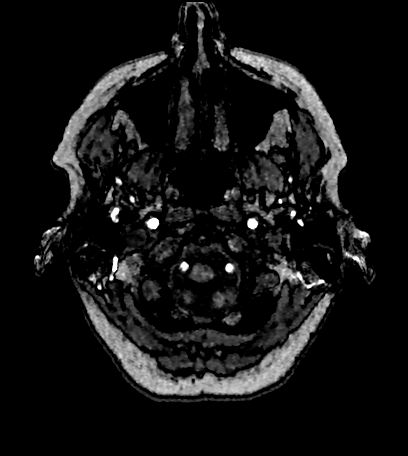
[im 28/217]
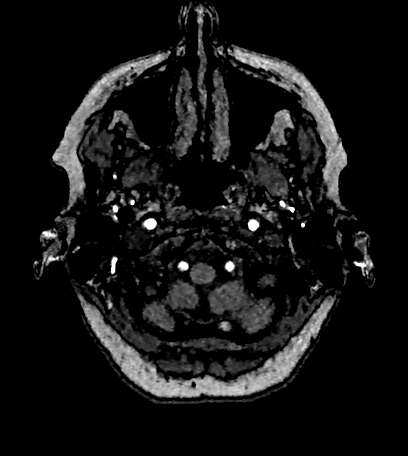
[im 33/217]
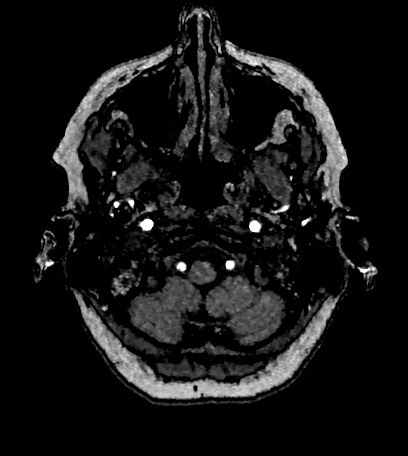
[im 37/217]
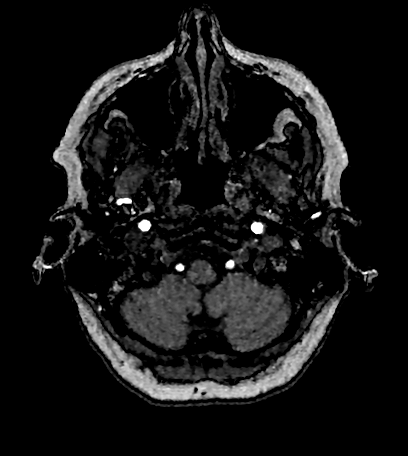
[im 42/217]
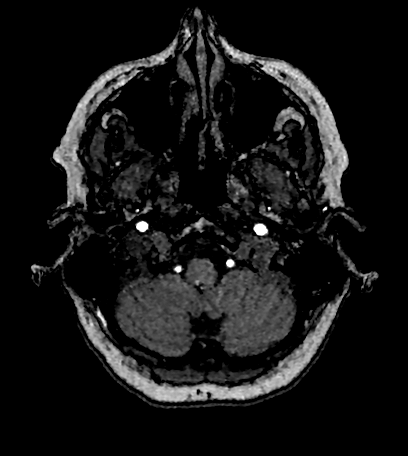
[im 46/217]
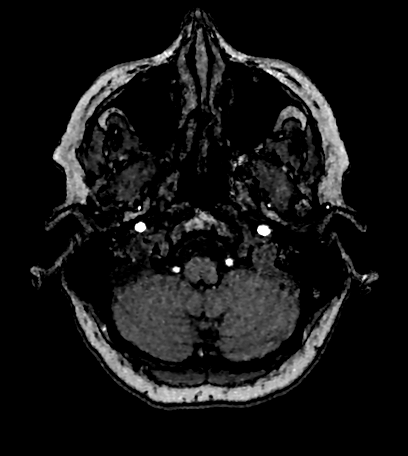
[im 51/217]
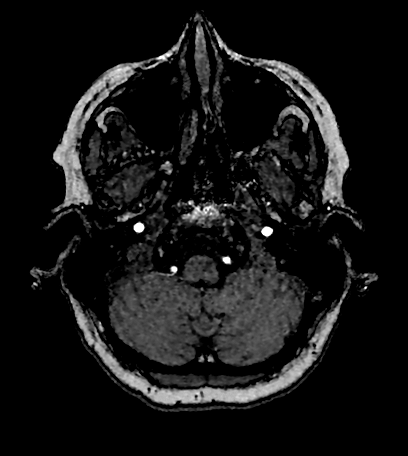
[im 56/217]
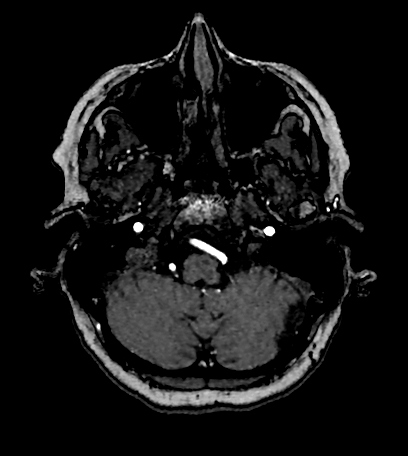
[im 60/217]
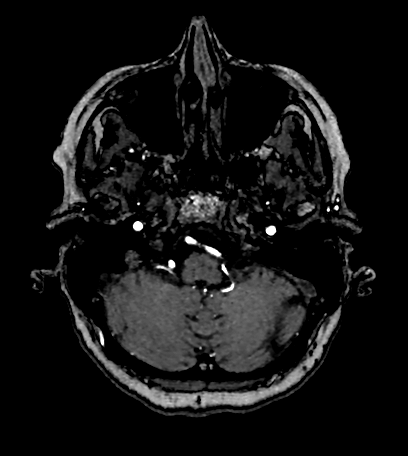
[im 65/217]
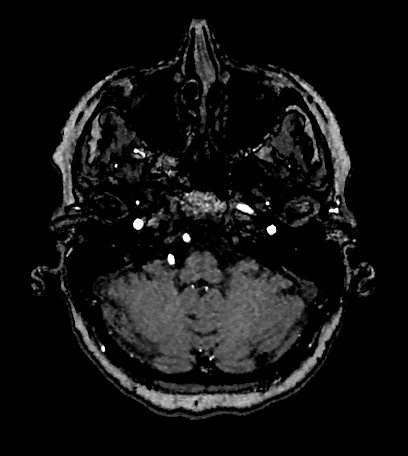
[im 69/217]
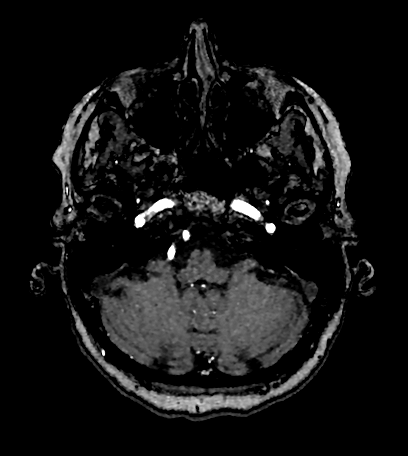
[im 74/217]
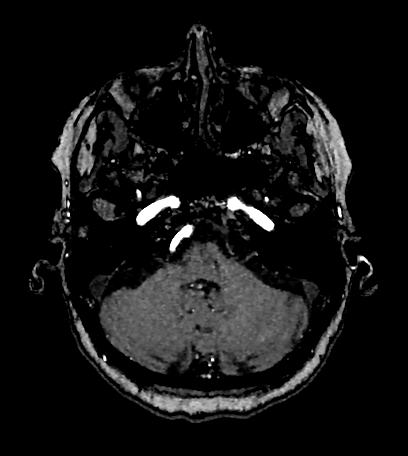
[im 79/217]
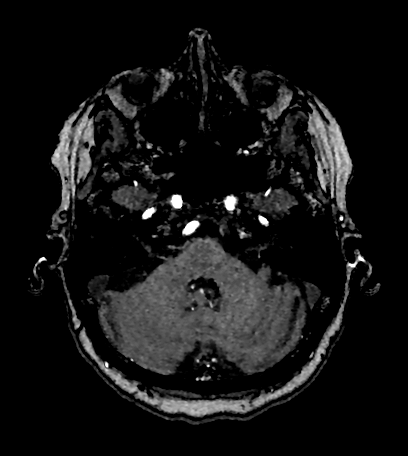
[im 83/217]
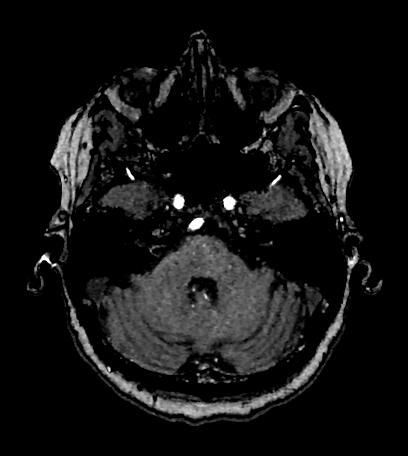
[im 88/217]
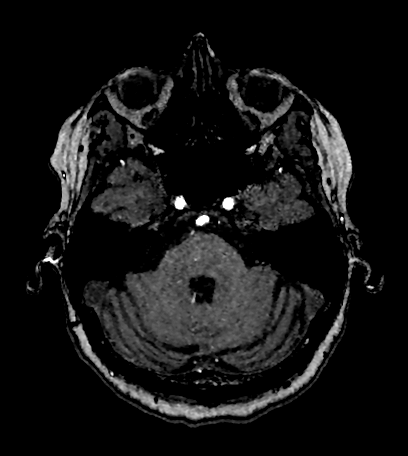
[im 97/217]
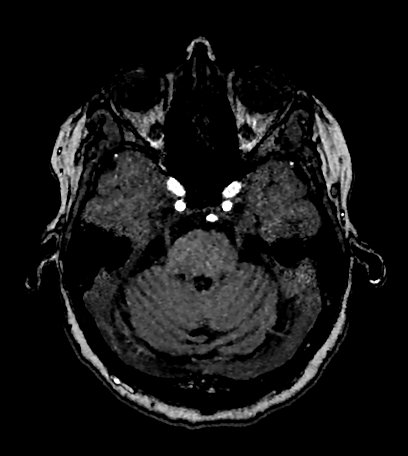
[im 111/217]
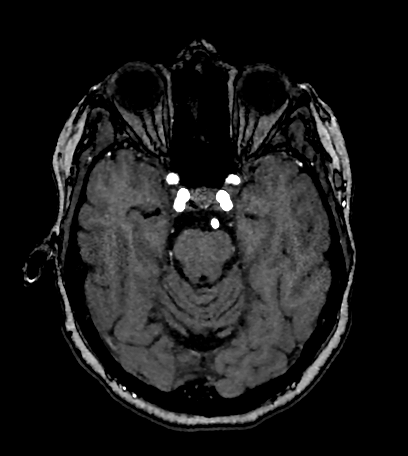
[im 125/217]
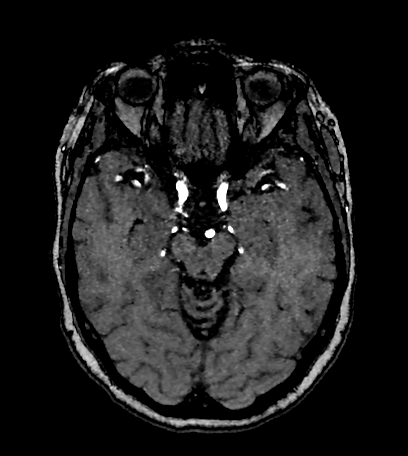
[im 152/217]
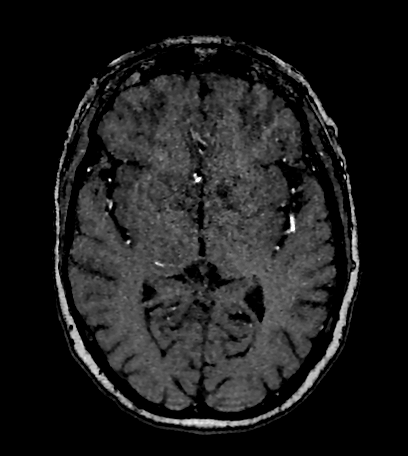
[im 180/217]
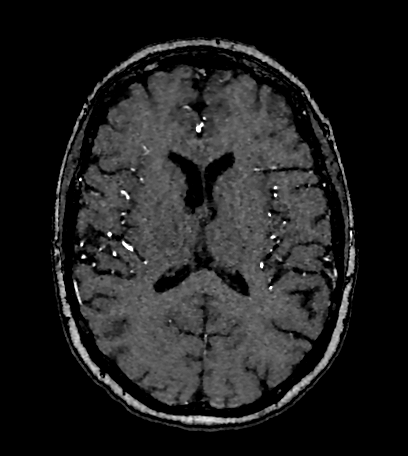
[im 184/217]
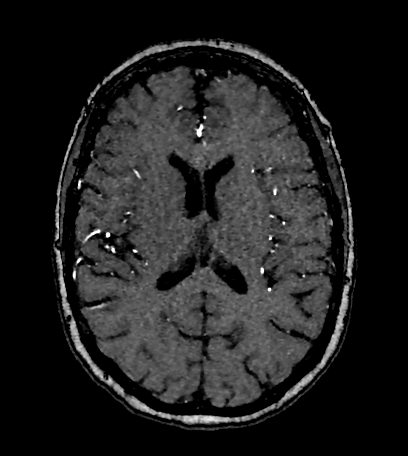
[im 207/217]
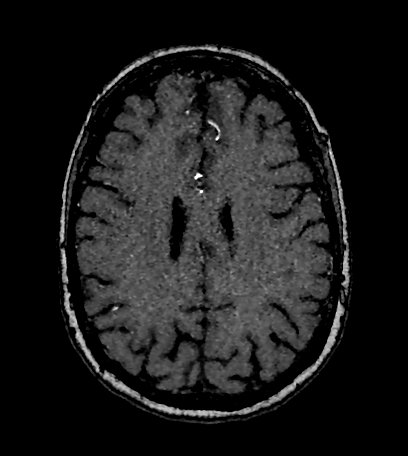

[27 of 48 positions shown; findings below may reference images not displayed]

FINDINGS: Anterior circulation: The visualized portions of the distal cervical
and intracranial internal carotid arteries are widely patent with
normal flow related enhancement noting mild ectasia of the
intracranial right ICA. The bilateral anterior cerebral arteries and
middle cerebral arteries are widely patent with anterograde flow
without high-grade flow-limiting stenosis or proximal branch
occlusion. No intracranial aneurysm within the anterior circulation.

Posterior circulation: The vertebral arteries are widely patent with
anterograde flow. Vertebrobasilar junction and basilar artery are
widely patent with anterograde flow without evidence of basilar
stenosis or aneurysm. Posterior cerebral arteries are normal
bilaterally. No intracranial aneurysm within the posterior
circulation.

Anatomic variants: None significant.

Other: None.
IMPRESSION: No hemodynamically significant stenosis, intracranial aneurysm or
evidence of arteriovenous shunt.

## 2021-08-18 DIAGNOSIS — H93A3 Pulsatile tinnitus, bilateral: Secondary | ICD-10-CM

## 2021-08-18 NOTE — Telephone Encounter (Signed)
Re sending to [place] who saw the patient.

## 2021-08-19 NOTE — Telephone Encounter (Signed)
Ashtyn, FYI.

## 2021-08-21 NOTE — Telephone Encounter (Signed)
Referral re-faxed  Letter attached to patient mychart with referral information  Message to patient to contact their office to schedule - I unfortunately have no say over their appointments and scheduling so they are only going to be able to schedule as early as their first available appt or cancellation.

## 2021-08-22 NOTE — Telephone Encounter (Signed)
Noted  

## 2021-08-28 NOTE — Telephone Encounter (Signed)
Please print and fax the referral and OV notes to [facility].  They are not receiving our referral via electronic faxing for some reason. I have refaxed it again on my end but not sure it will work.  Fax: [phone]

## 2021-08-28 NOTE — Telephone Encounter (Signed)
Faxed referral and 07/15/21 OV notes to [facility] at [phone].

## 2021-09-01 NOTE — Telephone Encounter (Signed)
12 weeks seems pretty far out. Are there other groups that can see her sooner?

## 2021-09-02 NOTE — Telephone Encounter (Signed)
She can call [facility] and continue checking for cancellations... everywhere is booked out several weeks. [facility] is usually very good about finding cancellations and working people in.   We can send her referral to [facility] on [facility]. She will need to contact her insurance to see if they are in Network before we send the referral.

## 2021-09-03 NOTE — Telephone Encounter (Signed)
FYI, Christie Wells. I placed a stat referral to [facility].

## 2021-09-03 NOTE — Addendum Note (Signed)
Addended by: Pleas Koch on: 09/03/2021 03:30 AM   Modules accepted: Orders

## 2021-09-04 ENCOUNTER — Encounter: Payer: Self-pay | Admitting: *Deleted

## 2021-09-04 NOTE — Telephone Encounter (Signed)
Referral sent to [facility] Mychart message and letter sent to patient making her aware.

## 2021-09-23 ENCOUNTER — Telehealth: Payer: Self-pay

## 2021-09-23 DIAGNOSIS — K219 Gastro-esophageal reflux disease without esophagitis: Secondary | ICD-10-CM

## 2021-09-23 DIAGNOSIS — I480 Paroxysmal atrial fibrillation: Secondary | ICD-10-CM

## 2021-09-23 DIAGNOSIS — I10 Essential (primary) hypertension: Secondary | ICD-10-CM

## 2021-09-23 MED ORDER — APIXABAN 5 MG PO TABS: 5.0000 mg | ORAL_TABLET | Freq: Two times a day (BID) | ORAL | 0 refills | Status: DC

## 2021-09-23 MED ORDER — LANSOPRAZOLE 30 MG PO CPDR: 30.0000 mg | DELAYED_RELEASE_CAPSULE | Freq: Two times a day (BID) | ORAL | 0 refills | Status: DC

## 2021-09-23 MED ORDER — AMLODIPINE BESYLATE 2.5 MG PO TABS: 2.5000 mg | ORAL_TABLET | Freq: Every day | ORAL | 0 refills | Status: DC

## 2021-09-23 NOTE — Telephone Encounter (Signed)
Noted. Short term supply sent to pharmacy as requested.

## 2021-09-23 NOTE — Telephone Encounter (Addendum)
Patient is calling in stating that she is two days short of medication as she did not pack enough [place]. Wondered if we can send in a small supply to last until she gets back home. She is short on amLODipine (NORVASC) 2.5 MG tablet ----- lansoprazole (PREVACID) 30 MG capsule -------apixaban (ELIQUIS) 5 MG TABS tablet. [facility]

## 2021-09-24 NOTE — Telephone Encounter (Signed)
Left detailed message on voicemail (per DPR.) that script has been sent in.

## 2021-09-30 ENCOUNTER — Other Ambulatory Visit: Payer: Self-pay | Admitting: Unknown Physician Specialty

## 2021-09-30 DIAGNOSIS — H93A3 Pulsatile tinnitus, bilateral: Secondary | ICD-10-CM

## 2021-10-07 ENCOUNTER — Ambulatory Visit
Admission: RE | Admit: 2021-10-07 | Discharge: 2021-10-07 | Disposition: A | Discharge status: Home / Self Care | Payer: Medicare Other | Source: Ambulatory Visit | Attending: Unknown Physician Specialty | Admitting: Unknown Physician Specialty

## 2021-10-07 DIAGNOSIS — H93A3 Pulsatile tinnitus, bilateral: Secondary | ICD-10-CM

## 2021-11-13 ENCOUNTER — Ambulatory Visit (INDEPENDENT_AMBULATORY_CARE_PROVIDER_SITE_OTHER): Payer: Medicare Other | Admitting: *Deleted

## 2021-11-13 DIAGNOSIS — Z1211 Encounter for screening for malignant neoplasm of colon: Secondary | ICD-10-CM

## 2021-11-13 DIAGNOSIS — Z0000 Encounter for general adult medical examination without abnormal findings: Secondary | ICD-10-CM

## 2021-11-13 NOTE — Patient Instructions (Signed)
Christie Wells , Thank you for taking time to come for your Medicare Wellness Visit. I appreciate your ongoing commitment to your health goals. Please review the following plan we discussed and let me know if I can assist you in the future.   Screening recommendations/referrals: Colonoscopy: Education provided Mammogram: up to date Bone Density: Education provided Recommended yearly ophthalmology/optometry visit for glaucoma screening and checkup Recommended yearly dental visit for hygiene and checkup  Vaccinations: Influenza vaccine: up to date Pneumococcal vaccine: up to date Tdap vaccine: up to date Shingles vaccine: up to date    Advanced directives: not on file      Preventive Care 70 Years and Older, Female Preventive care refers to lifestyle choices and visits with your health care provider that can promote health and wellness. What does preventive care include? A yearly physical exam. This is also called an annual well check. Dental exams once or twice a year. Routine eye exams. Ask your health care provider how often you should have your eyes checked. Personal lifestyle choices, including: Daily care of your teeth and gums. Regular physical activity. Eating a healthy diet. Avoiding tobacco and drug use. Limiting alcohol use. Practicing safe sex. Taking low-dose aspirin every day. Taking vitamin and mineral supplements as recommended by your health care provider. What happens during an annual well check? The services and screenings done by your health care provider during your annual well check will depend on your age, overall health, lifestyle risk factors, and family history of disease. Counseling  Your health care provider may ask you questions about your: Alcohol use. Tobacco use. Drug use. Emotional well-being. Home and relationship well-being. Sexual activity. Eating habits. History of falls. Memory and ability to understand (cognition). Work and work  Statistician. Reproductive health. Screening  You may have the following tests or measurements: Height, weight, and BMI. Blood pressure. Lipid and cholesterol levels. These may be checked every 5 years, or more frequently if you are over 70 years old. Skin check. Lung cancer screening. You may have this screening every year starting at age 70 if you have a 30-pack-year history of smoking and currently smoke or have quit within the past 15 years. Fecal occult blood test (FOBT) of the stool. You may have this test every year starting at age 70. Flexible sigmoidoscopy or colonoscopy. You may have a sigmoidoscopy every 5 years or a colonoscopy every 10 years starting at age 70. Hepatitis C blood test. Hepatitis B blood test. Sexually transmitted disease (STD) testing. Diabetes screening. This is done by checking your blood sugar (glucose) after you have not eaten for a while (fasting). You may have this done every 1-3 years. Bone density scan. This is done to screen for osteoporosis. You may have this done starting at age 70. Mammogram. This may be done every 1-2 years. Talk to your health care provider about how often you should have regular mammograms. Talk with your health care provider about your test results, treatment options, and if necessary, the need for more tests. Vaccines  Your health care provider may recommend certain vaccines, such as: Influenza vaccine. This is recommended every year. Tetanus, diphtheria, and acellular pertussis (Tdap, Td) vaccine. You may need a Td booster every 10 years. Zoster vaccine. You may need this after age 54. Pneumococcal 13-valent conjugate (PCV13) vaccine. One dose is recommended after age 81. Pneumococcal polysaccharide (PPSV23) vaccine. One dose is recommended after age 8. Talk to your health care provider about which screenings and vaccines you need and  how often you need them. This information is not intended to replace advice given to you by  your health care provider. Make sure you discuss any questions you have with your health care provider. Document Released: 03/29/2015 Document Revised: 11/20/2015 Document Reviewed: 01/01/2015 Elsevier Interactive Patient Education  2017 [place] Prevention in the Home Falls can cause injuries. They can happen to people of all ages. There are many things you can do to make your home safe and to help prevent falls. What can I do on the outside of my home? Regularly fix the edges of walkways and driveways and fix any cracks. Remove anything that might make you trip as you walk through a door, such as a raised step or threshold. Trim any bushes or trees on the path to your home. Use bright outdoor lighting. Clear any walking paths of anything that might make someone trip, such as rocks or tools. Regularly check to see if handrails are loose or broken. Make sure that both sides of any steps have handrails. Any raised decks and porches should have guardrails on the edges. Have any leaves, snow, or ice cleared regularly. Use sand or salt on walking paths during winter. Clean up any spills in your garage right away. This includes oil or grease spills. What can I do in the bathroom? Use night lights. Install grab bars by the toilet and in the tub and shower. Do not use towel bars as grab bars. Use non-skid mats or decals in the tub or shower. If you need to sit down in the shower, use a plastic, non-slip stool. Keep the floor dry. Clean up any water that spills on the floor as soon as it happens. Remove soap buildup in the tub or shower regularly. Attach bath mats securely with double-sided non-slip rug tape. Do not have throw rugs and other things on the floor that can make you trip. What can I do in the bedroom? Use night lights. Make sure that you have a light by your bed that is easy to reach. Do not use any sheets or blankets that are too big for your bed. They should not hang  down onto the floor. Have a firm chair that has side arms. You can use this for support while you get dressed. Do not have throw rugs and other things on the floor that can make you trip. What can I do in the kitchen? Clean up any spills right away. Avoid walking on wet floors. Keep items that you use a lot in easy-to-reach places. If you need to reach something above you, use a strong step stool that has a grab bar. Keep electrical cords out of the way. Do not use floor polish or wax that makes floors slippery. If you must use wax, use non-skid floor wax. Do not have throw rugs and other things on the floor that can make you trip. What can I do with my stairs? Do not leave any items on the stairs. Make sure that there are handrails on both sides of the stairs and use them. Fix handrails that are broken or loose. Make sure that handrails are as long as the stairways. Check any carpeting to make sure that it is firmly attached to the stairs. Fix any carpet that is loose or worn. Avoid having throw rugs at the top or bottom of the stairs. If you do have throw rugs, attach them to the floor with carpet tape. Make sure that you have  a light switch at the top of the stairs and the bottom of the stairs. If you do not have them, ask someone to add them for you. What else can I do to help prevent falls? Wear shoes that: Do not have high heels. Have rubber bottoms. Are comfortable and fit you well. Are closed at the toe. Do not wear sandals. If you use a stepladder: Make sure that it is fully opened. Do not climb a closed stepladder. Make sure that both sides of the stepladder are locked into place. Ask someone to hold it for you, if possible. Clearly mark and make sure that you can see: Any grab bars or handrails. First and last steps. Where the edge of each step is. Use tools that help you move around (mobility aids) if they are needed. These  include: Canes. Walkers. Scooters. Crutches. Turn on the lights when you go into a dark area. Replace any light bulbs as soon as they burn out. Set up your furniture so you have a clear path. Avoid moving your furniture around. If any of your floors are uneven, fix them. If there are any pets around you, be aware of where they are. Review your medicines with your doctor. Some medicines can make you feel dizzy. This can increase your chance of falling. Ask your doctor what other things that you can do to help prevent falls. This information is not intended to replace advice given to you by your health care provider. Make sure you discuss any questions you have with your health care provider. Document Released: 12/27/2008 Document Revised: 08/08/2015 Document Reviewed: 04/06/2014 Elsevier Interactive Patient Education  2017 [employer].

## 2021-11-13 NOTE — Progress Notes (Signed)
Subjective:   SELENNE COGGIN is a 71 y.o. female who presents for Medicare Annual (Subsequent) preventive examination.  I connected with  Falyn Rubel Capwell on 11/13/21 by a telephone  enabled telemedicine application and verified that I am speaking with the correct person using two identifiers.   I discussed the limitations of evaluation and management by telemedicine. The patient expressed understanding and agreed to proceed.  Patient location: home  Provider location: Tele-Health-home    Review of Systems     Cardiac Risk Factors include: advanced age (>25mn, >>89women);hypertension     Objective:    Today's Vitals   There is no height or weight on file to calculate BMI.     11/13/2021   10:53 AM 10/13/2019    3:37 PM  Advanced Directives  Does Patient Have a Medical Advance Directive? Yes No  Type of Advance Directive HBoswellin Chart? No - copy requested     Current Medications (verified) Outpatient Encounter Medications as of 11/13/2021  Medication Sig   ACETAMINOPHEN-BUTALBITAL 50-325 MG TABS Take by mouth. 50-325-40   as needed   amLODipine (NORVASC) 2.5 MG tablet Take 1 tablet (2.5 mg total) by mouth in the morning and at bedtime. For blood pressure.   celecoxib (CELEBREX) 100 MG capsule Take 100 mg by mouth 2 (two) times daily.   cholecalciferol (VITAMIN D) 1000 units tablet Take 3,000 Units by mouth daily.   gabapentin (NEURONTIN) 100 MG capsule Take 100 mg by mouth at bedtime.   hydrOXYzine (ATARAX/VISTARIL) 10 MG tablet TAKE 1-2 TABLETS (10-20 MG TOTAL) BY MOUTH 2 (TWO) TIMES DAILY AS NEEDED FOR ANXIETY.   lansoprazole (PREVACID) 30 MG capsule TAKE 1 CAPSULE TWICE A DAY BEFORE MEALS FOR HEARTBURN   levothyroxine (SYNTHROID) 88 MCG tablet TAKE 1 TAB EVERY MORNING ON AN EMPTY STOMACH WITH WATER ONLY. NO FOOD OR OTHER MEDS FOR 30 MIN   nortriptyline (PAMELOR) 10 MG capsule Take by mouth.   amLODipine  (NORVASC) 2.5 MG tablet Take 1 tablet (2.5 mg total) by mouth daily. for blood pressure.   apixaban (ELIQUIS) 5 MG TABS tablet TAKE 1 TABLET TWICE A DAY   apixaban (ELIQUIS) 5 MG TABS tablet Take 1 tablet (5 mg total) by mouth 2 (two) times daily.   cyanocobalamin 1000 MCG tablet Take 1,000 mcg by mouth daily.   fluticasone (FLONASE) 50 MCG/ACT nasal spray Place 2 sprays into both nostrils daily. (Patient not taking: Reported on 11/13/2021)   lansoprazole (PREVACID) 30 MG capsule Take 1 capsule (30 mg total) by mouth 2 (two) times daily before a meal. For heartburn.   rizatriptan (MAXALT) 10 MG tablet PLEASE SEE ATTACHED FOR DETAILED DIRECTIONS (Patient not taking: Reported on 11/13/2021)   SUMAtriptan (IMITREX) 50 MG tablet 1 TABLET BY MOUTH AT MIGRAINE ONSET MAY REPEAT WITH SECOND TABLET IN 2 HOURS IF MIGRAINE PERSIST (Patient not taking: Reported on 11/13/2021)   No facility-administered encounter medications on file as of 11/13/2021.    Allergies (verified) Codeine, Peanut-containing drug products, Sulfa antibiotics, and Topamax [topiramate]   History: Past Medical History:  Diagnosis Date   Arthritis    Chronic headaches    Colitis 1984   COVID-19 virus infection 10/10/2020   GERD (gastroesophageal reflux disease)    Hypothyroidism    Migraines    TIA (transient ischemic attack)    UTI (urinary tract infection)    Past Surgical History:  Procedure Laterality Date  BUNIONECTOMY Bilateral    CARDIAC ELECTROPHYSIOLOGY STUDY AND ABLATION  02/22/2020   COLONOSCOPY  2015   FOOT SURGERY Bilateral    5 screws    TUBAL LIGATION     UPPER GI ENDOSCOPY     Gastritis   Family History  Problem Relation Age of Onset   Alzheimer's disease Mother    Hypertension Mother    Hyperlipidemia Mother    COPD Mother    Aneurysm Mother    Stroke Father    Hypertension Father    Heart disease Maternal Grandmother    Parkinson's disease Paternal Grandfather    Aneurysm Brother    Colon  cancer Neg Hx    Esophageal cancer Neg Hx    Breast cancer Neg Hx    Social History   Socioeconomic History   Marital status: Divorced    Spouse name: Not on file   Number of children: 2   Years of education: Not on file   Highest education level: Not on file  Occupational History   Not on file  Tobacco Use   Smoking status: Never   Smokeless tobacco: Never  Vaping Use   Vaping Use: Never used  Substance and Sexual Activity   Alcohol use: Yes    Alcohol/week: 1.0 standard drink of alcohol    Types: 1 Shots of liquor per week   Drug use: No   Sexual activity: Not on file  Other Topics Concern   Not on file  Social History Narrative   Divorced.   2 children.   Moved from [place].   Once worked as an Journalist, newspaper.       Social Determinants of Health   Financial Resource Strain: Low Risk  (11/13/2021)   Overall Financial Resource Strain (CARDIA)    Difficulty of Paying Living Expenses: Not hard at all  Food Insecurity: No Food Insecurity (11/13/2021)   Hunger Vital Sign    Worried About Running Out of Food in the Last Year: Never true    Ran Out of Food in the Last Year: Never true  Transportation Needs: No Transportation Needs (11/13/2021)   PRAPARE - Hydrologist (Medical): No    Lack of Transportation (Non-Medical): No  Physical Activity: Sufficiently Active (11/13/2021)   Exercise Vital Sign    Days of Exercise per Week: 4 days    Minutes of Exercise per Session: 50 min  Stress: No Stress Concern Present (11/13/2021)   [place]    Feeling of Stress : Not at all  Social Connections: Moderately Isolated (11/13/2021)   Social Connection and Isolation Panel [NHANES]    Frequency of Communication with Friends and Family: More than three times a week    Frequency of Social Gatherings with Friends and Family: More than three times a week    Attends Religious  Services: Never    Marine scientist or Organizations: Yes    Attends Music therapist: More than 4 times per year    Marital Status: Divorced    Tobacco Counseling Counseling given: Not Answered   Clinical Intake:  Pre-visit preparation completed: Yes  Pain : No/denies pain     Nutritional Risks: None Diabetes: No  How often do you need to have someone help you when you read instructions, pamphlets, or other written materials from your doctor or pharmacy?: 1 - Never  Diabetic? no  Interpreter Needed?: No  Information entered by ::  Leroy Kennedy LPN   Activities of Daily Living    11/13/2021   10:15 AM 01/23/2021    2:20 PM  In your present state of health, do you have any difficulty performing the following activities:  Hearing? 0 0  Vision? 0 1  Comment  glasses  Difficulty concentrating or making decisions? 0 0  Walking or climbing stairs? 0 0  Dressing or bathing? 0 0  Doing errands, shopping? 0 0  Preparing Food and eating ? N   Using the Toilet? N   In the past six months, have you accidently leaked urine? N   Do you have problems with loss of bowel control? N   Managing your Medications? N   Managing your Finances? N   Housekeeping or managing your Housekeeping? N     Patient Care Team: Pleas Koch, NP as PCP - General (Internal Medicine) Vickie Epley, MD as PCP - Electrophysiology (Cardiology) Laroy Apple, MD as Referring Physician (Physical Medicine and Rehabilitation)  Indicate any recent Medical Services you may have received from other than [facility] providers in the past year (date may be approximate).     Assessment:   This is a routine wellness examination for [place].  Hearing/Vision screen Hearing Screening - Comments:: No trouble hearing Vision Screening - Comments:: Up to date Cataract surgery 2022  [facility]  Dietary issues and exercise activities discussed: Current Exercise Habits: Home  exercise routine, Type of exercise: walking;Other - see comments (golf and swimming), Time (Minutes): 40, Frequency (Times/Week): 3, Weekly Exercise (Minutes/Week): 120, Intensity: Mild   Goals Addressed             This Visit's Progress    Weight (lb) < 200 lb (90.7 kg)       Hit 155 as a weight More consistent with exercise       Depression Screen    11/13/2021   10:19 AM 01/23/2021    2:19 PM 03/04/2020    2:17 PM 10/05/2017   12:22 PM  PHQ 2/9 Scores  PHQ - 2 Score 0 0 0 0  PHQ- 9 Score  0 0     Fall Risk    11/13/2021   10:05 AM 01/23/2021    2:19 PM 03/04/2020    2:16 PM 01/16/2020   11:57 AM 10/05/2017   12:22 PM  Fall Risk   Falls in the past year? 1 0 0 0 No  Number falls in past yr: 0 0 0 0   Injury with Fall? 0 0 0 0   Follow up Falls evaluation completed;Education provided;Falls prevention discussed        FALL RISK PREVENTION PERTAINING TO THE HOME:  Any stairs in or around the home? No  If so, are there any without handrails? No  Home free of loose throw rugs in walkways, pet beds, electrical cords, etc? Yes  Adequate lighting in your home to reduce risk of falls? Yes   ASSISTIVE DEVICES UTILIZED TO PREVENT FALLS:  Life alert? No  Use of a cane, walker or w/c? No  Grab bars in the bathroom? No  Shower chair or bench in shower? Yes  Elevated toilet seat or a handicapped toilet? Yes   TIMED UP AND GO:  Was the test performed? No .    Cognitive Function:        11/13/2021   10:07 AM  6CIT Screen  What Year? 0 points  What month? 0 points  What time? 0 points  Count back from 20 0 points  Months in reverse 0 points  Repeat phrase 0 points  Total Score 0 points    Immunizations Immunization History  Administered Date(s) Administered   Fluad Quad(high Dose 65+) 12/28/2018, 11/15/2020   Influenza, High Dose Seasonal PF 02/01/2017   Influenza,inj,Quad PF,6+ Mos 03/02/2016, 11/25/2017, 01/16/2020   Influenza,inj,quad, With  Preservative 03/02/2016   PFIZER Comirnaty(Gray Top)Covid-19 Tri-Sucrose Vaccine 07/02/2020   PFIZER(Purple Top)SARS-COV-2 Vaccination 04/22/2019, 05/16/2019, 12/27/2019   PPD Test 12/08/2017   Pneumococcal Conjugate-13 01/11/2019   Pneumococcal Polysaccharide-23 03/04/2020   Tdap 06/01/2014   Zoster Recombinat (Shingrix) 10/28/2016, 04/23/2017   Zoster, Live 07/18/2012    TDAP status: Up to date  Flu Vaccine status: Up to date  Pneumococcal vaccine status: Up to date  Covid-19 vaccine status: Information provided on how to obtain vaccines.   Qualifies for Shingles Vaccine? Yes   Zostavax completed Yes   Shingrix Completed?: Yes  Screening Tests Health Maintenance  Topic Date Due   COVID-19 Vaccine (5 - Pfizer series) 08/27/2020   COLONOSCOPY (Pts 45-6yr Insurance coverage will need to be confirmed)  04/01/2021   INFLUENZA VACCINE  10/14/2021   MAMMOGRAM  04/24/2023   TETANUS/TDAP  05/31/2024   Pneumonia Vaccine 70 Years old  Completed   DEXA SCAN  Completed   Hepatitis C Screening  Completed   Zoster Vaccines- Shingrix  Completed   HPV VACCINES  Aged Out    Health Maintenance  Health Maintenance Due  Topic Date Due   COVID-19 Vaccine (5 - Pfizer series) 08/27/2020   COLONOSCOPY (Pts 45-446yrInsurance coverage will need to be confirmed)  04/01/2021   INFLUENZA VACCINE  10/14/2021    Colorectal cancer screening: Type of screening: Colonoscopy. Completed 2020. Repeat every 3 years  Mammogram status: Completed 2023. Repeat every year  Bone Density status: Ordered  . Pt provided with contact info and advised to call to schedule appt.  Lung Cancer Screening: (Low Dose CT Chest recommended if Age 70-80ears, 30 pack-year currently smoking OR have quit w/in 15years.) does not qualify.   Lung Cancer Screening Referral:   Additional Screening:  Hepatitis C Screening: does not qualify; Completed 2017  Vision Screening: Recommended annual ophthalmology exams for  early detection of glaucoma and other disorders of the eye. Is the patient up to date with their annual eye exam?  Yes  Who is the provider or what is the name of the office in which the patient attends annual eye exams? [place] is not established with a provider, would they like to be referred to a provider to establish care? No .   Dental Screening: Recommended annual dental exams for proper oral hygiene  Community Resource Referral / Chronic Care Management: CRR required this visit?  No   CCM required this visit?  No      Plan:     I have personally reviewed and noted the following in the patient's chart:   Medical and social history Use of alcohol, tobacco or illicit drugs  Current medications and supplements including opioid prescriptions. Patient is not currently taking opioid prescriptions. Functional ability and status Nutritional status Physical activity Advanced directives List of other physicians Hospitalizations, surgeries, and ER visits in previous 12 months Vitals Screenings to include cognitive, depression, and falls Referrals and appointments  In addition, I have reviewed and discussed with patient certain preventive protocols, quality metrics, and best practice recommendations. A written personalized care plan for preventive services as well as general preventive  health recommendations were provided to patient.     Leroy Kennedy, LPN   [id number]   Nurse Notes:

## 2021-12-02 ENCOUNTER — Ambulatory Visit (INDEPENDENT_AMBULATORY_CARE_PROVIDER_SITE_OTHER): Payer: Medicare Other | Admitting: Family

## 2021-12-02 VITALS — BP 126/78 | HR 72 | Temp 98.2°F | Resp 16 | Ht 66.0 in | Wt 170.4 lb

## 2021-12-02 DIAGNOSIS — J029 Acute pharyngitis, unspecified: Secondary | ICD-10-CM | POA: Diagnosis not present

## 2021-12-02 DIAGNOSIS — Z20822 Contact with and (suspected) exposure to covid-19: Secondary | ICD-10-CM | POA: Diagnosis not present

## 2021-12-02 DIAGNOSIS — U071 COVID-19: Secondary | ICD-10-CM

## 2021-12-02 HISTORY — DX: COVID-19: U07.1

## 2021-12-02 LAB — POCT RAPID STREP A (OFFICE): Rapid Strep A Screen: NEGATIVE

## 2021-12-02 LAB — POC COVID19 BINAXNOW: SARS Coronavirus 2 Ag: POSITIVE — AB

## 2021-12-02 MED ORDER — MOLNUPIRAVIR EUA 200MG CAPSULE: 4.0000 | ORAL_CAPSULE | Freq: Two times a day (BID) | ORAL | 0 refills | Status: AC

## 2021-12-02 MED ORDER — AZITHROMYCIN 250 MG PO TABS: ORAL_TABLET | ORAL | 0 refills | Status: AC

## 2021-12-02 NOTE — Progress Notes (Signed)
Established Patient Office Visit  Subjective:  Patient ID: Christie Wells, female    DOB: 1950-09-29  Age: 71 y.o. MRN: [id number]  CC:  Chief Complaint  Patient presents with   Sore Throat    Only at night X 4 days     HPI [facility] is here today with concerns.   For the last five days with scratchy sore throat. Has noticed bothersome post nasal drip and she will drink some water with some improvement however last night the sore throat was very uncomfortable and worsening post nasal drip that made it hard for her to go to sleep. She didn't get to sleep until 3 am due to symptoms, to which she took benadryl which helped her sleep. She has been taking claritin the last three days without much relief.   No fever or chills.  No sinus pressure, no nasal congestion, and or ear pain.  Sore throat as of today is gone, but still scratchy.  Cough has started as well, and thinks post nasal drip. No chest congestion.   Past Medical History:  Diagnosis Date   Arthritis    Chronic headaches    Colitis 1984   COVID-19 virus infection 10/10/2020   GERD (gastroesophageal reflux disease)    Hypothyroidism    Migraines    TIA (transient ischemic attack)    UTI (urinary tract infection)     Past Surgical History:  Procedure Laterality Date   BUNIONECTOMY Bilateral    CARDIAC ELECTROPHYSIOLOGY STUDY AND ABLATION  02/22/2020   COLONOSCOPY  2015   FOOT SURGERY Bilateral    5 screws    TUBAL LIGATION     UPPER GI ENDOSCOPY     Gastritis    Family History  Problem Relation Age of Onset   Alzheimer's disease Mother    Hypertension Mother    Hyperlipidemia Mother    COPD Mother    Aneurysm Mother    Stroke Father    Hypertension Father    Heart disease Maternal Grandmother    Parkinson's disease Paternal Grandfather    Aneurysm Brother    Colon cancer Neg Hx    Esophageal cancer Neg Hx    Breast cancer Neg Hx     Social History   Socioeconomic History   Marital status:  Divorced    Spouse name: Not on file   Number of children: 2   Years of education: Not on file   Highest education level: Not on file  Occupational History   Not on file  Tobacco Use   Smoking status: Never   Smokeless tobacco: Never  Vaping Use   Vaping Use: Never used  Substance and Sexual Activity   Alcohol use: Yes    Alcohol/week: 1.0 standard drink of alcohol    Types: 1 Shots of liquor per week   Drug use: No   Sexual activity: Not on file  Other Topics Concern   Not on file  Social History Narrative   Divorced.   2 children.   Moved from [place].   Once worked as an Journalist, newspaper.       Social Determinants of Health   Financial Resource Strain: Low Risk  (11/13/2021)   Overall Financial Resource Strain (CARDIA)    Difficulty of Paying Living Expenses: Not hard at all  Food Insecurity: No Food Insecurity (11/13/2021)   Hunger Vital Sign    Worried About Running Out of Food in the Last Year: Never true  Ran Out of Food in the Last Year: Never true  Transportation Needs: No Transportation Needs (11/13/2021)   PRAPARE - Hydrologist (Medical): No    Lack of Transportation (Non-Medical): No  Physical Activity: Sufficiently Active (11/13/2021)   Exercise Vital Sign    Days of Exercise per Week: 4 days    Minutes of Exercise per Session: 50 min  Stress: No Stress Concern Present (11/13/2021)   [place]    Feeling of Stress : Not at all  Social Connections: Moderately Isolated (11/13/2021)   Social Connection and Isolation Panel [NHANES]    Frequency of Communication with Friends and Family: More than three times a week    Frequency of Social Gatherings with Friends and Family: More than three times a week    Attends Religious Services: Never    Marine scientist or Organizations: Yes    Attends Music therapist: More than 4 times per year     Marital Status: Divorced  Intimate Partner Violence: Not At Risk (11/13/2021)   Humiliation, Afraid, Rape, and Kick questionnaire    Fear of Current or Ex-Partner: No    Emotionally Abused: No    Physically Abused: No    Sexually Abused: No    Outpatient Medications Prior to Visit  Medication Sig Dispense Refill   ACETAMINOPHEN-BUTALBITAL 50-325 MG TABS Take by mouth. 50-325-40   as needed     amLODipine (NORVASC) 2.5 MG tablet Take 1 tablet (2.5 mg total) by mouth in the morning and at bedtime. For blood pressure. 180 tablet 2   amLODipine (NORVASC) 2.5 MG tablet Take 1 tablet (2.5 mg total) by mouth daily. for blood pressure. 30 tablet 0   apixaban (ELIQUIS) 5 MG TABS tablet Take 1 tablet (5 mg total) by mouth 2 (two) times daily. 60 tablet 0   celecoxib (CELEBREX) 100 MG capsule Take 100 mg by mouth 2 (two) times daily as needed.     cholecalciferol (VITAMIN D) 1000 units tablet Take 3,000 Units by mouth daily.     cyanocobalamin 1000 MCG tablet Take 1,000 mcg by mouth daily.     fluticasone (FLONASE) 50 MCG/ACT nasal spray Place 2 sprays into both nostrils daily. 16 g 6   gabapentin (NEURONTIN) 100 MG capsule Take 100 mg by mouth at bedtime.     hydrOXYzine (ATARAX/VISTARIL) 10 MG tablet TAKE 1-2 TABLETS (10-20 MG TOTAL) BY MOUTH 2 (TWO) TIMES DAILY AS NEEDED FOR ANXIETY. 90 tablet 0   lansoprazole (PREVACID) 30 MG capsule Take 1 capsule (30 mg total) by mouth 2 (two) times daily before a meal. For heartburn. 60 capsule 0   levothyroxine (SYNTHROID) 88 MCG tablet TAKE 1 TAB EVERY MORNING ON AN EMPTY STOMACH WITH WATER ONLY. NO FOOD OR OTHER MEDS FOR 30 MIN 90 tablet 2   nortriptyline (PAMELOR) 10 MG capsule Take by mouth.     rizatriptan (MAXALT) 10 MG tablet      SUMAtriptan (IMITREX) 50 MG tablet 1 TABLET BY MOUTH AT MIGRAINE ONSET MAY REPEAT WITH SECOND TABLET IN 2 HOURS IF MIGRAINE PERSIST 10 tablet 0   apixaban (ELIQUIS) 5 MG TABS tablet TAKE 1 TABLET TWICE A DAY 180 tablet 2    lansoprazole (PREVACID) 30 MG capsule TAKE 1 CAPSULE TWICE A DAY BEFORE MEALS FOR HEARTBURN 180 capsule 2   No facility-administered medications prior to visit.    Allergies  Allergen Reactions   Codeine Nausea  And Vomiting    Other reaction(s): vomiting    Peanut-Containing Drug Products     Headaches   Sulfa Antibiotics Rash    Other reaction(s): rash   Topamax [Topiramate] Other (See Comments)    Dizzy, numbness/tingling        Objective:    Physical Exam Constitutional:      General: She is not in acute distress.    Appearance: Normal appearance. She is not ill-appearing.  HENT:     Right Ear: Hearing, tympanic membrane, ear canal and external ear normal.     Left Ear: Hearing, ear canal and external ear normal. A middle ear effusion (clear) is present. Tympanic membrane is not erythematous or bulging.     Nose: Nose normal. No congestion or rhinorrhea.     Right Turbinates: Not enlarged or swollen.     Left Turbinates: Not enlarged or swollen.     Right Sinus: No maxillary sinus tenderness or frontal sinus tenderness.     Left Sinus: No maxillary sinus tenderness or frontal sinus tenderness.     Mouth/Throat:     Mouth: Mucous membranes are moist.     Pharynx: Posterior oropharyngeal erythema present. No pharyngeal swelling or oropharyngeal exudate.     Tonsils: No tonsillar exudate.  Eyes:     Extraocular Movements: Extraocular movements intact.     Conjunctiva/sclera: Conjunctivae normal.     Pupils: Pupils are equal, round, and reactive to light.  Neck:     Thyroid: No thyroid mass.  Cardiovascular:     Rate and Rhythm: Normal rate and regular rhythm.  Pulmonary:     Effort: Pulmonary effort is normal.     Breath sounds: Normal breath sounds.  Lymphadenopathy:     Cervical:     Right cervical: No superficial cervical adenopathy.    Left cervical: No superficial cervical adenopathy.  Neurological:     Mental Status: She is alert.     BP 126/78    Pulse 72   Temp 98.2 F (36.8 C)   Resp 16   Ht '5\' 6"'$  (1.676 m)   Wt 170 lb 6 oz (77.3 kg)   SpO2 99%   BMI 27.50 kg/m  Wt Readings from Last 3 Encounters:  12/02/21 170 lb 6 oz (77.3 kg)  07/15/21 163 lb 4 oz (74 kg)  04/08/21 165 lb (74.8 kg)     Health Maintenance Due  Topic Date Due   COVID-19 Vaccine (5 - Pfizer series) 08/27/2020   COLONOSCOPY (Pts 45-19yr Insurance coverage will need to be confirmed)  04/01/2021   INFLUENZA VACCINE  10/14/2021    There are no preventive care reminders to display for this patient.  Lab Results  Component Value Date   TSH 0.84 04/08/2021   Lab Results  Component Value Date   WBC 11.6 (H) 10/13/2019   HGB 16.6 (H) 10/13/2019   HCT 46.4 (H) 10/13/2019   MCV 88.7 10/13/2019   PLT 216 10/13/2019   Lab Results  Component Value Date   NA 141 04/08/2021   K 3.7 04/08/2021   CO2 30 04/08/2021   GLUCOSE 79 04/08/2021   BUN 16 04/08/2021   CREATININE 0.99 04/08/2021   BILITOT 0.9 04/08/2021   ALKPHOS 87 04/08/2021   AST 15 04/08/2021   ALT 11 04/08/2021   PROT 6.8 04/08/2021   ALBUMIN 4.3 04/08/2021   CALCIUM 9.6 04/08/2021   ANIONGAP 11 10/13/2019   GFR 58.28 (L) 04/08/2021   Lab Results  Component Value Date  HGBA1C 5.2 04/08/2021      Assessment & Plan:   Problem List Items Addressed This Visit       Other   COVID-19    Advised of CDC guidelines for self isolation/ ending isolation.  Advised of safe practice guidelines. Symptom Tier reviewed.  Encouraged to monitor for any worsening symptoms; watch for increased shortness of breath, weakness, and signs of dehydration. Advised when to seek emergency care.  Instructed to rest and hydrate well.  Advised to leave the house during recommended isolation period, only if it is necessary to seek medical care  rx zpack as ongoing with no improvement and bad h/o covid 19 exacerbation in the past  covid tested in office, positive.  Strep tested with sore throat, negative.    Pt considered high risk for hospitalization. have decided pt is a candidate for antiviral and pt agrees that she would like to take this. I have sent in RX for molnupiravir 200 mg capsules to be taken as directed.       Relevant Medications   molnupiravir EUA (LAGEVRIO) 200 mg CAPS capsule   azithromycin (ZITHROMAX) 250 MG tablet   Other Visit Diagnoses     Suspected COVID-19 virus infection    -  Primary   Relevant Medications   azithromycin (ZITHROMAX) 250 MG tablet   Other Relevant Orders   POCT rapid strep A (Completed)   POC COVID-19 BinaxNow (Completed)   Sore throat       Relevant Orders   POCT rapid strep A (Completed)   POC COVID-19 BinaxNow (Completed)       Meds ordered this encounter  Medications   molnupiravir EUA (LAGEVRIO) 200 mg CAPS capsule    Sig: Take 4 capsules (800 mg total) by mouth 2 (two) times daily for 5 days.    Dispense:  40 capsule    Refill:  0    Order Specific Question:   Supervising Provider    Answer:   BEDSOLE, AMY E [[id number]]   azithromycin (ZITHROMAX) 250 MG tablet    Sig: Take 2 tablets on day 1, then 1 tablet daily on days 2 through 5    Dispense:  6 tablet    Refill:  0    Order Specific Question:   Supervising Provider    Answer:   Diona Browner, AMY E [[id number]]    Follow-up: Return for f/u with primary care provider if no improvement.    Eugenia Pancoast, FNP

## 2021-12-02 NOTE — Assessment & Plan Note (Addendum)
Advised of CDC guidelines for self isolation/ ending isolation.  Advised of safe practice guidelines. Symptom Tier reviewed.  Encouraged to monitor for any worsening symptoms; watch for increased shortness of breath, weakness, and signs of dehydration. Advised when to seek emergency care.  Instructed to rest and hydrate well.  Advised to leave the house during recommended isolation period, only if it is necessary to seek medical care  rx zpack as ongoing with no improvement and bad h/o covid 19 exacerbation in the past  covid tested in office, positive.  Strep tested with sore throat, negative.   Pt considered high risk for hospitalization. have decided pt is a candidate for antiviral and pt agrees that she would like to take this. I have sent in RX for molnupiravir 200 mg capsules to be taken as directed.

## 2021-12-02 NOTE — Patient Instructions (Addendum)
  Gargle with warm salt water as needed for sore throat.   Be sure to rest, drink plenty of fluids, and use tylenol or ibuprofen as needed for pain. Follow up if fever >101, if symptoms worsen or if symptoms are not improved in 3 days.   You can try a few things over the counter to help with your symptoms including:  Get some over the counter mucinex.   You should be feeling better by day seven of symptoms, but please do contact me if this is not the case.   Regards,   Eugenia Pancoast FNP-C

## 2021-12-12 ENCOUNTER — Other Ambulatory Visit: Payer: Self-pay | Admitting: Primary Care

## 2021-12-12 DIAGNOSIS — K219 Gastro-esophageal reflux disease without esophagitis: Secondary | ICD-10-CM

## 2021-12-26 ENCOUNTER — Telehealth: Payer: Self-pay | Admitting: *Deleted

## 2021-12-26 NOTE — Telephone Encounter (Signed)
Clearance letter routed to PCP for Eliquis. Staff reminder sent to self to ensure clearance is done prior to procedure.

## 2021-12-26 NOTE — Telephone Encounter (Signed)
Need clearance for Eliquis please colonoscopy 01/20/22

## 2022-01-02 ENCOUNTER — Ambulatory Visit (AMBULATORY_SURGERY_CENTER): Payer: Self-pay

## 2022-01-02 VITALS — Ht 67.0 in | Wt 166.0 lb

## 2022-01-02 DIAGNOSIS — Z86010 Personal history of colonic polyps: Secondary | ICD-10-CM

## 2022-01-02 MED ORDER — NA SULFATE-K SULFATE-MG SULF 17.5-3.13-1.6 GM/177ML PO SOLN: 1.0000 | Freq: Once | ORAL | 0 refills | Status: AC

## 2022-01-02 NOTE — Telephone Encounter (Signed)
Christie Wells, Please resend request or call Dr. Jacqulyn Liner office to obtain the Eliquis hold for this patient as she has completed her PV today and has been sent her prep instructions and will need to have her hold instructions for her Eliquis.  Her procedure is scheduled for Tuesday 01/20/2022 with Dr. Tarri Glenn. Please let patient know of number of hold days for Eliquis as soon as possible;  Thank you

## 2022-01-02 NOTE — Progress Notes (Signed)
No egg or soy allergy known to patient  No issues known to pt with past sedation with any surgeries or procedures Patient denies ever being told they had issues or difficulty with intubation  No FH of Malignant Hyperthermia Pt is not on diet pills Pt is not on home 02  Pt is not on blood thinners  Pt denies issues with constipation  AFIB/AFLUTTER dx Have any cardiac testing pending--NO Pt instructed to use Singlecare.com or GoodRx for a price reduction on prep   Patient's chart reviewed by Osvaldo Angst CNRA prior to previsit and patient appropriate for the [place].  Previsit completed and red dot placed by patient's name on their procedure day (on provider's schedule).    Insurance verified during [place] appt=[employer] Medicare  Pre visit completed via phone call; Patient verified name, DOB, and address;   ELIQUIS hold still needs to be obtained from Dr. Janeece Riggers from [facility] for patient prior to procedure on Tuesday 01/20/2022; TE will be sent to Dr. Tarri Glenn CMA for this hold to be obtained and for the patient to be notified of this information as soon as possible;

## 2022-01-05 NOTE — Telephone Encounter (Signed)
Letter faxed to Dr. Janeece Riggers [phone].

## 2022-01-07 NOTE — Telephone Encounter (Signed)
Received fax from Dr. Janeece Riggers stating patient may hold eliquis for 2 days prior to procedure, and should restart as soon as allowed post procedure. Patient has been advised & verbalized all understanding.

## 2022-01-13 ENCOUNTER — Ambulatory Visit
Admission: RE | Admit: 2022-01-13 | Discharge: 2022-01-13 | Disposition: A | Discharge status: Home / Self Care | Payer: Medicare Other | Source: Ambulatory Visit | Attending: Primary Care | Admitting: Primary Care

## 2022-01-13 ENCOUNTER — Encounter: Payer: Self-pay | Admitting: Gastroenterology

## 2022-01-13 DIAGNOSIS — E2839 Other primary ovarian failure: Secondary | ICD-10-CM | POA: Diagnosis present

## 2022-01-20 ENCOUNTER — Ambulatory Visit (AMBULATORY_SURGERY_CENTER): Payer: Medicare Other | Admitting: Gastroenterology

## 2022-01-20 ENCOUNTER — Encounter: Payer: Self-pay | Admitting: Gastroenterology

## 2022-01-20 VITALS — BP 136/87 | HR 71 | Temp 97.5°F | Resp 13 | Ht 67.0 in | Wt 166.0 lb

## 2022-01-20 DIAGNOSIS — Z09 Encounter for follow-up examination after completed treatment for conditions other than malignant neoplasm: Secondary | ICD-10-CM | POA: Diagnosis not present

## 2022-01-20 DIAGNOSIS — Z86010 Personal history of colonic polyps: Secondary | ICD-10-CM

## 2022-01-20 DIAGNOSIS — D124 Benign neoplasm of descending colon: Secondary | ICD-10-CM

## 2022-01-20 DIAGNOSIS — K635 Polyp of colon: Secondary | ICD-10-CM | POA: Diagnosis not present

## 2022-01-20 DIAGNOSIS — D123 Benign neoplasm of transverse colon: Secondary | ICD-10-CM | POA: Diagnosis not present

## 2022-01-20 DIAGNOSIS — D122 Benign neoplasm of ascending colon: Secondary | ICD-10-CM | POA: Diagnosis not present

## 2022-01-20 MED ORDER — SODIUM CHLORIDE 0.9 % IV SOLN: 500.0000 mL | Freq: Once | INTRAVENOUS | Status: DC

## 2022-01-20 NOTE — Progress Notes (Signed)
Pt's states no medical or surgical changes since previsit or office visit. 

## 2022-01-20 NOTE — Patient Instructions (Signed)
   Handout on polyps given to you today   Await pathology results on polyps removed     YOU HAD AN ENDOSCOPIC PROCEDURE TODAY AT [facility]:   Refer to the procedure report that was given to you for any specific questions about what was found during the examination.  If the procedure report does not answer your questions, please call your gastroenterologist to clarify.  If you requested that your care partner not be given the details of your procedure findings, then the procedure report has been included in a sealed envelope for you to review at your convenience later.  YOU SHOULD EXPECT: Some feelings of bloating in the abdomen. Passage of more gas than usual.  Walking can help get rid of the air that was put into your GI tract during the procedure and reduce the bloating. If you had a lower endoscopy (such as a colonoscopy or flexible sigmoidoscopy) you may notice spotting of blood in your stool or on the toilet paper. If you underwent a bowel prep for your procedure, you may not have a normal bowel movement for a few days.  Please Note:  You might notice some irritation and congestion in your nose or some drainage.  This is from the oxygen used during your procedure.  There is no need for concern and it should clear up in a day or so.  SYMPTOMS TO REPORT IMMEDIATELY:  Following lower endoscopy (colonoscopy or flexible sigmoidoscopy):  Excessive amounts of blood in the stool  Significant tenderness or worsening of abdominal pains  Swelling of the abdomen that is new, acute  Fever of 100F or higher   For urgent or emergent issues, a gastroenterologist can be reached at any hour by calling [phone]. Do not use MyChart messaging for urgent concerns.    DIET:  We do recommend a small meal at first, but then you may proceed to your regular diet.  Drink plenty of fluids but you should avoid alcoholic beverages for 24 hours.  ACTIVITY:  You should plan to take it easy  for the rest of today and you should NOT DRIVE or use heavy machinery until tomorrow (because of the sedation medicines used during the test).    FOLLOW UP: Our staff will call the number listed on your records the next business day following your procedure.  We will call around 7:15- 8:00 am to check on you and address any questions or concerns that you may have regarding the information given to you following your procedure. If we do not reach you, we will leave a message.     If any biopsies were taken you will be contacted by phone or by letter within the next 1-3 weeks.  Please call us at [phone] if you have not heard about the biopsies in 3 weeks.    SIGNATURES/CONFIDENTIALITY: You and/or your care partner have signed paperwork which will be entered into your electronic medical record.  These signatures attest to the fact that that the information above on your After Visit Summary has been reviewed and is understood.  Full responsibility of the confidentiality of this discharge information lies with you and/or your care-partner.

## 2022-01-20 NOTE — Progress Notes (Signed)
Pt said she is no longer taking a blood thinner ,that her physician took her off of Eliquis after she had an Ablation.

## 2022-01-20 NOTE — Progress Notes (Signed)
Referring Provider: Pleas Koch, NP Primary Care Physician:  Pleas Koch, NP  Indication for Procedure:  Colon cancer Surveillance   IMPRESSION:  Need for colon cancer surveillance Appropriate candidate for monitored anesthesia care  PLAN: Colonoscopy in the [place] today   HPI: Christie Wells is a 71 y.o. female presents for surveillance colonoscopy.  Prior endoscopic history:    - Colonoscopy in [place] 12/29/2013    - 3 tubular adenomas on colonoscopy 04/01/2018    - Surveillance colonoscopy due 2023    No baseline GI symptoms.   No known family history of colon cancer or polyps. No family history of uterine/endometrial cancer, pancreatic cancer or gastric/stomach cancer.   Past Medical History:  Diagnosis Date   Arthritis    generalized   Blood transfusion without reported diagnosis 1953   Cataract 2022   bilateral sx   Chronic headaches    Colitis 1984   COVID-19 virus infection 10/10/2020   GERD (gastroesophageal reflux disease)    on meds   Hypertension    on meds   Hypothyroidism    on meds   Migraines    Peripheral neuropathy    bilateral feet   Personal history of atrial flutter    had ablation sx 02/2020   TIA (transient ischemic attack)    UTI (urinary tract infection)     Past Surgical History:  Procedure Laterality Date   BUNIONECTOMY Bilateral    CARDIAC ELECTROPHYSIOLOGY STUDY AND ABLATION  02/22/2020   CATARACT EXTRACTION, BILATERAL Bilateral 2022   COLONOSCOPY  2015   COLONOSCOPY  2020   KB-MAC-Suprep(exc)-SSA/TA   FOOT SURGERY Bilateral 2010   5 screws  and again in 2012   POLYPECTOMY  2020   SSA/TA   TUBAL LIGATION     UPPER GI ENDOSCOPY     Gastritis   WISDOM TOOTH EXTRACTION      Current Outpatient Medications  Medication Sig Dispense Refill   amLODipine (NORVASC) 2.5 MG tablet Take 1 tablet (2.5 mg total) by mouth in the morning and at bedtime. For blood pressure. 180 tablet 2   CALCIUM-VITAMIN D PO Take 1 tablet  by mouth daily at 6 (six) AM.     cholecalciferol (VITAMIN D) 1000 units tablet Take 3,000 Units by mouth daily.     cyanocobalamin 1000 MCG tablet Take 1,000 mcg by mouth daily.     lansoprazole (PREVACID) 30 MG capsule Take 1 capsule (30 mg total) by mouth 2 (two) times daily before a meal. For heartburn. 60 capsule 0   levothyroxine (SYNTHROID) 88 MCG tablet TAKE 1 TAB EVERY MORNING ON AN EMPTY STOMACH WITH WATER ONLY. NO FOOD OR OTHER MEDS FOR 30 MIN 90 tablet 2   nortriptyline (PAMELOR) 10 MG capsule Take 30 mg by mouth daily at 6 (six) AM.     ACETAMINOPHEN-BUTALBITAL 50-325 MG TABS Take by mouth. 73-220-25   as needed (Patient not taking: Reported on 01/20/2022)     butalbital-acetaminophen-caffeine (FIORICET) 50-325-40 MG tablet Take 1 tablet by mouth every 4 (four) hours as needed. Take 1 tablet at headache onset, can repeat after 4 hours. No more than 2 pills in 24 hours. Do not take more than 2-3 times a week MAXIMUM. (Patient not taking: Reported on 01/02/2022)     celecoxib (CELEBREX) 100 MG capsule Take 100 mg by mouth 2 (two) times daily as needed.     EMGALITY 120 MG/ML SOAJ Inject 1 mL into the skin every 30 (thirty) days. (Patient not  taking: Reported on 01/02/2022)     fluticasone (FLONASE) 50 MCG/ACT nasal spray Place 2 sprays into both nostrils daily. (Patient not taking: Reported on 01/20/2022) 16 g 6   gabapentin (NEURONTIN) 100 MG capsule Take 100 mg by mouth at bedtime. (Patient not taking: Reported on 01/20/2022)     hydrOXYzine (ATARAX/VISTARIL) 10 MG tablet TAKE 1-2 TABLETS (10-20 MG TOTAL) BY MOUTH 2 (TWO) TIMES DAILY AS NEEDED FOR ANXIETY. 90 tablet 0   rizatriptan (MAXALT) 10 MG tablet Take 10 mg by mouth as needed for migraine.     SUMAtriptan (IMITREX) 50 MG tablet 1 TABLET BY MOUTH AT MIGRAINE ONSET MAY REPEAT WITH SECOND TABLET IN 2 HOURS IF MIGRAINE PERSIST 10 tablet 0   Ubrogepant 100 MG TABS Take by mouth.     Current Facility-Administered Medications  Medication  Dose Route Frequency Provider Last Rate Last Admin   0.9 %  sodium chloride infusion  500 mL Intravenous Once Thornton Park, MD        Allergies as of 01/20/2022 - Review Complete 01/20/2022  Allergen Reaction Noted   Codeine Nausea And Vomiting 09/19/2009   Peanut-containing drug products  04/21/2017   Sulfa antibiotics Rash 09/19/2009   Topamax [topiramate] Other (See Comments) 12/20/2020    Family History  Problem Relation Age of Onset   Alzheimer's disease Mother    Hypertension Mother    Hyperlipidemia Mother    COPD Mother    Aneurysm Mother    Stroke Father    Hypertension Father    Aneurysm Brother    Heart disease Maternal Grandmother    Parkinson's disease Paternal Grandfather    Colon cancer Neg Hx    Esophageal cancer Neg Hx    Breast cancer Neg Hx    Rectal cancer Neg Hx    Stomach cancer Neg Hx      Physical Exam: General:   Alert,  well-nourished, pleasant and cooperative in NAD Head:  Normocephalic and atraumatic. Eyes:  Sclera clear, no icterus.   Conjunctiva pink. Mouth:  No deformity or lesions.   Neck:  Supple; no masses or thyromegaly. Lungs:  Clear throughout to auscultation.   No wheezes. Heart:  Regular rate and rhythm; no murmurs. Abdomen:  Soft, non-tender, nondistended, normal bowel sounds, no rebound or guarding.  Msk:  Symmetrical. No boney deformities LAD: No inguinal or umbilical LAD Extremities:  No clubbing or edema. Neurologic:  Alert and  oriented x4;  grossly nonfocal Skin:  No obvious rash or bruise. Psych:  Alert and cooperative. Normal mood and affect.     Studies/Results: No results found.    America Sandall L. Tarri Glenn, MD, MPH 01/20/2022, 9:18 AM

## 2022-01-20 NOTE — Progress Notes (Signed)
A and O x3. Report to RN. Tolerated MAC anesthesia well. 

## 2022-01-20 NOTE — Progress Notes (Signed)
Called to room to assist during endoscopic procedure.  Patient ID and intended procedure confirmed with present staff. Received instructions for my participation in the procedure from the performing physician.  

## 2022-01-20 NOTE — Op Note (Signed)
[place] Patient Name: Christie Wells Procedure Date: 01/20/2022 9:11 AM MRN: [id number] Endoscopist: Thornton Park MD, MD, [id number] Age: 71 Referring MD:  Date of Birth: [birth year] Gender: Female Account #: [id number] Procedure:                Colonoscopy Indications:              High risk colon cancer surveillance: Personal                            history of multiple (3 or more) adenomas                           - Colonoscopy 2015 in [place]: 3 tubular adenomas                           - Colonoscopy 2020: 3 small tubular adenomas Medicines:                Monitored Anesthesia Care Procedure:                Pre-Anesthesia Assessment:                           - Prior to the procedure, a History and Physical                            was performed, and patient medications and                            allergies were reviewed. The patient's tolerance of                            previous anesthesia was also reviewed. The risks                            and benefits of the procedure and the sedation                            options and risks were discussed with the patient.                            All questions were answered, and informed consent                            was obtained. Prior Anticoagulants: The patient has                            taken no anticoagulant or antiplatelet agents. ASA                            Grade Assessment: II - A patient with mild systemic                            disease. After reviewing the risks and benefits,  the patient was deemed in satisfactory condition to                            undergo the procedure.                           After obtaining informed consent, the colonoscope                            was passed under direct vision. Throughout the                            procedure, the patient's blood pressure, pulse, and                            oxygen saturations were  monitored continuously. The                            CF [id number] #[id number] was introduced through the anus                            and advanced to the 3 cm into the ileum. A second                            forward view of the right colon was performed. The                            colonoscopy was performed without difficulty. The                            patient tolerated the procedure well. The quality                            of the bowel preparation was good. The terminal                            ileum, ileocecal valve, appendiceal orifice, and                            rectum were photographed. Scope In: 9:29:50 AM Scope Out: 9:49:38 AM Scope Withdrawal Time: 0 hours 17 minutes 39 seconds  Total Procedure Duration: 0 hours 19 minutes 48 seconds  Findings:                 The perianal and digital rectal examinations were                            normal.                           A 10 mm polyp was found in the proximal ascending                            colon. The polyp was flat. Preparations were made  for mucosal resection. 3.5cc Saline was injected to                            raise the lesion. Snare mucosal resection was                            performed. Resection and retrieval were complete.                           A 3 mm polyp was found in the transverse colon. The                            polyp was sessile. The polyp was removed with a                            cold snare. Resection and retrieval were complete.                            Estimated blood loss was minimal.                           Two sessile polyps were found in the descending                            colon. The polyps were 2 to 3 mm in size. These                            polyps were removed with a cold snare. Resection                            and retrieval were complete. Estimated blood loss                            was minimal.                            The exam was otherwise without abnormality on                            direct and retroflexion views. Complications:            No immediate complications. Estimated Blood Loss:     Estimated blood loss was minimal. Impression:               - One 10 mm polyp in the proximal ascending colon,                            removed with mucosal resection. Resected and                            retrieved.                           - One 3 mm polyp in the transverse colon, removed  with a cold snare. Resected and retrieved.                           - Two 2 to 3 mm polyps in the descending colon,                            removed with a cold snare. Resected and retrieved.                           - The examination was otherwise normal on direct                            and retroflexion views.                           - Mucosal resection was performed. Resection and                            retrieval were complete. Recommendation:           - Patient has a contact number available for                            emergencies. The signs and symptoms of potential                            delayed complications were discussed with the                            patient. Return to normal activities tomorrow.                            Written discharge instructions were provided to the                            patient.                           - Resume previous diet.                           - Continue present medications.                           - Await pathology results.                           - Repeat colonoscopy in 3 years for surveillance.                           - Emerging evidence supports eating a diet of                            fruits, vegetables, grains, calcium, and yogurt  while reducing red meat and alcohol may reduce the                            risk of colon cancer.                           - Thank you  for allowing me to be involved in your                            colon cancer prevention. Thornton Park MD, MD 01/20/2022 10:00:32 AM This report has been signed electronically.

## 2022-01-21 ENCOUNTER — Telehealth: Payer: Self-pay

## 2022-01-21 NOTE — Telephone Encounter (Signed)
  Follow up Call-     01/20/2022    8:32 AM  Call back number  Post procedure Call Back phone  # [phone]  Permission to leave phone message Yes     Patient questions:  Do you have a fever, pain , or abdominal swelling? No. Pain Score  0 *  Have you tolerated food without any problems? Yes.    Have you been able to return to your normal activities? Yes.    Do you have any questions about your discharge instructions: Diet   No. Medications  No. Follow up visit  No.  Do you have questions or concerns about your Care? No.  Actions: * If pain score is 4 or above: No action needed, pain <4.

## 2022-01-22 ENCOUNTER — Other Ambulatory Visit: Payer: Self-pay | Admitting: Primary Care

## 2022-01-22 DIAGNOSIS — I10 Essential (primary) hypertension: Secondary | ICD-10-CM

## 2022-01-27 ENCOUNTER — Encounter: Payer: Self-pay | Admitting: Gastroenterology

## 2022-02-17 ENCOUNTER — Ambulatory Visit (INDEPENDENT_AMBULATORY_CARE_PROVIDER_SITE_OTHER): Payer: Medicare Other | Admitting: Primary Care

## 2022-02-17 ENCOUNTER — Encounter: Payer: Self-pay | Admitting: Primary Care

## 2022-02-17 VITALS — BP 144/82 | HR 70 | Temp 99.0°F | Ht 67.0 in | Wt 174.0 lb

## 2022-02-17 DIAGNOSIS — R300 Dysuria: Secondary | ICD-10-CM | POA: Insufficient documentation

## 2022-02-17 DIAGNOSIS — R3129 Other microscopic hematuria: Secondary | ICD-10-CM | POA: Diagnosis not present

## 2022-02-17 HISTORY — DX: Other microscopic hematuria: R31.29

## 2022-02-17 LAB — URINALYSIS, MICROSCOPIC ONLY: RBC / HPF: NONE SEEN (ref 0–?)

## 2022-02-17 LAB — POC URINALSYSI DIPSTICK (AUTOMATED)
Bilirubin, UA: NEGATIVE
Blood, UA: POSITIVE
Glucose, UA: NEGATIVE
Ketones, UA: NEGATIVE
Leukocytes, UA: NEGATIVE
Nitrite, UA: NEGATIVE
Protein, UA: NEGATIVE
Spec Grav, UA: 1.015 (ref 1.010–1.025)
Urobilinogen, UA: 0.2 E.U./dL
pH, UA: 6 (ref 5.0–8.0)

## 2022-02-17 NOTE — Patient Instructions (Signed)
We will be in touch on Thursday regarding your urine test results.  It was a pleasure to see you today!

## 2022-02-17 NOTE — Assessment & Plan Note (Addendum)
Noted on UA today and also from UA from April 2022 per [facility].  Urine microscopic and culture pending. Consider further work up for hematuria if warranted.

## 2022-02-17 NOTE — Assessment & Plan Note (Addendum)
Resolved.  UA today with trace blood, otherwise negative.   Urine culture added and pending. Adding urine microscopic for trace blood.   Await results.

## 2022-02-17 NOTE — Progress Notes (Signed)
Subjective:    Patient ID: Christie Wells, female    DOB: 1950/07/29, 71 y.o.   MRN: [id number]  HPI  Christie Wells is a very pleasant 71 y.o. female with a history of hypertension, migraines, paroxysmal atrial flutter, hypothyroidism, anxiety, who presents today to discuss painful urination.  Symptom onset three days ago with dysuria. She denies increased urinary frequency, hematuria, abdominal pain, pelvic pain, vaginal itching/discharge.   She took one dose of AZO two days ago and her symptoms have since abated. She drinks plenty of water during the day.    Review of Systems  Constitutional:  Negative for fever.  Gastrointestinal:  Negative for abdominal pain.  Genitourinary:  Positive for dysuria. Negative for flank pain, pelvic pain and vaginal discharge.         Past Medical History:  Diagnosis Date   Arthritis    generalized   Blood transfusion without reported diagnosis 1953   Cataract 2022   bilateral sx   Chronic headaches    Colitis 1984   COVID-19 virus infection 10/10/2020   GERD (gastroesophageal reflux disease)    on meds   Hypertension    on meds   Hypothyroidism    on meds   Migraines    Peripheral neuropathy    bilateral feet   Personal history of atrial flutter    had ablation sx 02/2020   TIA (transient ischemic attack)    UTI (urinary tract infection)     Social History   Socioeconomic History   Marital status: Divorced    Spouse name: Not on file   Number of children: 2   Years of education: Not on file   Highest education level: Not on file  Occupational History   Not on file  Tobacco Use   Smoking status: Never   Smokeless tobacco: Never  Vaping Use   Vaping Use: Never used  Substance and Sexual Activity   Alcohol use: Yes    Alcohol/week: 0.0 - 2.0 standard drinks of alcohol   Drug use: No   Sexual activity: Not on file  Other Topics Concern   Not on file  Social History Narrative   Divorced.   2 children.   Moved  from [place].   Once worked as an Journalist, newspaper.       Social Determinants of Health   Financial Resource Strain: Low Risk  (11/13/2021)   Overall Financial Resource Strain (CARDIA)    Difficulty of Paying Living Expenses: Not hard at all  Food Insecurity: No Food Insecurity (11/13/2021)   Hunger Vital Sign    Worried About Running Out of Food in the Last Year: Never true    Ran Out of Food in the Last Year: Never true  Transportation Needs: No Transportation Needs (11/13/2021)   PRAPARE - Hydrologist (Medical): No    Lack of Transportation (Non-Medical): No  Physical Activity: Sufficiently Active (11/13/2021)   Exercise Vital Sign    Days of Exercise per Week: 4 days    Minutes of Exercise per Session: 50 min  Stress: No Stress Concern Present (11/13/2021)   [place]    Feeling of Stress : Not at all  Social Connections: Moderately Isolated (11/13/2021)   Social Connection and Isolation Panel [NHANES]    Frequency of Communication with Friends and Family: More than three times a week    Frequency of Social Gatherings with Friends and Family: More  than three times a week    Attends Religious Services: Never    Active Member of Clubs or Organizations: Yes    Attends Archivist Meetings: More than 4 times per year    Marital Status: Divorced  Intimate Partner Violence: Not At Risk (11/13/2021)   Humiliation, Afraid, Rape, and Kick questionnaire    Fear of Current or Ex-Partner: No    Emotionally Abused: No    Physically Abused: No    Sexually Abused: No    Past Surgical History:  Procedure Laterality Date   BUNIONECTOMY Bilateral    CARDIAC ELECTROPHYSIOLOGY STUDY AND ABLATION  02/22/2020   CATARACT EXTRACTION, BILATERAL Bilateral 2022   COLONOSCOPY  2015   COLONOSCOPY  2020   KB-MAC-Suprep(exc)-SSA/TA   FOOT SURGERY Bilateral 2010   5 screws  and again in 2012    POLYPECTOMY  2020   SSA/TA   TUBAL LIGATION     UPPER GI ENDOSCOPY     Gastritis   WISDOM TOOTH EXTRACTION      Family History  Problem Relation Age of Onset   Alzheimer's disease Mother    Hypertension Mother    Hyperlipidemia Mother    COPD Mother    Aneurysm Mother    Stroke Father    Hypertension Father    Aneurysm Brother    Heart disease Maternal Grandmother    Parkinson's disease Paternal Grandfather    Colon cancer Neg Hx    Esophageal cancer Neg Hx    Breast cancer Neg Hx    Rectal cancer Neg Hx    Stomach cancer Neg Hx     Allergies  Allergen Reactions   Codeine Nausea And Vomiting    Other reaction(s): vomiting    Peanut-Containing Drug Products     Headaches   Sulfa Antibiotics Rash    Other reaction(s): rash   Topamax [Topiramate] Other (See Comments)    Dizzy, numbness/tingling    Current Outpatient Medications on File Prior to Visit  Medication Sig Dispense Refill   ACETAMINOPHEN-BUTALBITAL 50-325 MG TABS Take by mouth. 50-325-40   as needed     amLODipine (NORVASC) 2.5 MG tablet TAKE 1 TABLET IN THE MORNING AND AT BEDTIME FOR BLOOD PRESSURE 180 tablet 3   butalbital-acetaminophen-caffeine (FIORICET) 50-325-40 MG tablet Take 1 tablet by mouth every 4 (four) hours as needed. Take 1 tablet at headache onset, can repeat after 4 hours. No more than 2 pills in 24 hours. Do not take more than 2-3 times a week MAXIMUM.     CALCIUM-VITAMIN D PO Take 1 tablet by mouth daily at 6 (six) AM.     celecoxib (CELEBREX) 100 MG capsule Take 100 mg by mouth 2 (two) times daily as needed.     cholecalciferol (VITAMIN D) 1000 units tablet Take 3,000 Units by mouth daily.     cyanocobalamin 1000 MCG tablet Take 1,000 mcg by mouth daily.     EMGALITY 120 MG/ML SOAJ Inject 1 mL into the skin every 30 (thirty) days.     gabapentin (NEURONTIN) 100 MG capsule Take 100 mg by mouth at bedtime.     hydrOXYzine (ATARAX/VISTARIL) 10 MG tablet TAKE 1-2 TABLETS (10-20 MG TOTAL) BY  MOUTH 2 (TWO) TIMES DAILY AS NEEDED FOR ANXIETY. 90 tablet 0   lansoprazole (PREVACID) 30 MG capsule Take 1 capsule (30 mg total) by mouth 2 (two) times daily before a meal. For heartburn. 60 capsule 0   levothyroxine (SYNTHROID) 88 MCG tablet TAKE 1 TAB EVERY MORNING  ON AN EMPTY STOMACH WITH WATER ONLY. NO FOOD OR OTHER MEDS FOR 30 MIN 90 tablet 2   nortriptyline (PAMELOR) 10 MG capsule Take 30 mg by mouth daily at 6 (six) AM.     rizatriptan (MAXALT) 10 MG tablet Take 10 mg by mouth as needed for migraine.     SUMAtriptan (IMITREX) 50 MG tablet 1 TABLET BY MOUTH AT MIGRAINE ONSET MAY REPEAT WITH SECOND TABLET IN 2 HOURS IF MIGRAINE PERSIST 10 tablet 0   fluticasone (FLONASE) 50 MCG/ACT nasal spray Place 2 sprays into both nostrils daily. (Patient not taking: Reported on 01/20/2022) 16 g 6   Ubrogepant 100 MG TABS Take by mouth. (Patient not taking: Reported on 02/17/2022)     No current facility-administered medications on file prior to visit.    BP (!) 144/82   Pulse 70   Temp 99 F (37.2 C) (Temporal)   Ht _0  (1.702 m)   Wt 174 lb (78.9 kg)   SpO2 99%   BMI 27.25 kg/m  Objective:   Physical Exam Constitutional:      General: She is not in acute distress.    Appearance: She is not ill-appearing.  Cardiovascular:     Rate and Rhythm: Normal rate.  Pulmonary:     Effort: Pulmonary effort is normal.  Abdominal:     Palpations: Abdomen is soft.     Tenderness: There is no abdominal tenderness. There is no right CVA tenderness or left CVA tenderness.           Assessment & Plan:   Problem List Items Addressed This Visit       Genitourinary   Microscopic hematuria    Noted on UA today and also from UA from April 2022 per [facility].  Urine microscopic and culture pending. Consider further work up for hematuria if warranted.       Relevant Orders   Urine Microscopic     Other   Dysuria - Primary    Resolved.  UA today with trace blood, otherwise  negative.   Urine culture added and pending. Adding urine microscopic for trace blood.   Await results.       Relevant Orders   POCT Urinalysis Dipstick (Automated) (Completed)   Urine Culture   Urine Microscopic       Pleas Koch, NP

## 2022-02-18 LAB — URINE CULTURE
MICRO NUMBER:: 14272375
Result:: NO GROWTH
SPECIMEN QUALITY:: ADEQUATE

## 2022-03-09 ENCOUNTER — Other Ambulatory Visit: Payer: Self-pay | Admitting: Primary Care

## 2022-03-09 DIAGNOSIS — K219 Gastro-esophageal reflux disease without esophagitis: Secondary | ICD-10-CM

## 2022-03-09 NOTE — Telephone Encounter (Signed)
Patient is due for CPE/follow up in late January 2024. Please schedule.

## 2022-03-10 NOTE — Telephone Encounter (Signed)
Patient has been scheduled

## 2022-03-21 ENCOUNTER — Other Ambulatory Visit: Payer: Self-pay | Admitting: Primary Care

## 2022-03-21 DIAGNOSIS — E039 Hypothyroidism, unspecified: Secondary | ICD-10-CM

## 2022-04-16 ENCOUNTER — Ambulatory Visit (INDEPENDENT_AMBULATORY_CARE_PROVIDER_SITE_OTHER): Payer: Medicare Other | Admitting: Primary Care

## 2022-04-16 ENCOUNTER — Encounter: Payer: Self-pay | Admitting: Primary Care

## 2022-04-16 VITALS — BP 110/86 | HR 82 | Temp 98.1°F | Ht 67.0 in | Wt 173.0 lb

## 2022-04-16 DIAGNOSIS — F419 Anxiety disorder, unspecified: Secondary | ICD-10-CM

## 2022-04-16 DIAGNOSIS — I10 Essential (primary) hypertension: Secondary | ICD-10-CM | POA: Diagnosis not present

## 2022-04-16 DIAGNOSIS — E039 Hypothyroidism, unspecified: Secondary | ICD-10-CM | POA: Diagnosis not present

## 2022-04-16 DIAGNOSIS — E785 Hyperlipidemia, unspecified: Secondary | ICD-10-CM

## 2022-04-16 DIAGNOSIS — Z0000 Encounter for general adult medical examination without abnormal findings: Secondary | ICD-10-CM | POA: Diagnosis not present

## 2022-04-16 DIAGNOSIS — K219 Gastro-esophageal reflux disease without esophagitis: Secondary | ICD-10-CM

## 2022-04-16 DIAGNOSIS — M545 Low back pain, unspecified: Secondary | ICD-10-CM

## 2022-04-16 DIAGNOSIS — I480 Paroxysmal atrial fibrillation: Secondary | ICD-10-CM

## 2022-04-16 DIAGNOSIS — G43109 Migraine with aura, not intractable, without status migrainosus: Secondary | ICD-10-CM | POA: Diagnosis not present

## 2022-04-16 DIAGNOSIS — R3129 Other microscopic hematuria: Secondary | ICD-10-CM

## 2022-04-16 DIAGNOSIS — G8929 Other chronic pain: Secondary | ICD-10-CM

## 2022-04-16 LAB — COMPREHENSIVE METABOLIC PANEL
ALT: 11 U/L (ref 0–35)
AST: 15 U/L (ref 0–37)
Albumin: 4.2 g/dL (ref 3.5–5.2)
Alkaline Phosphatase: 131 U/L — ABNORMAL HIGH (ref 39–117)
BUN: 16 mg/dL (ref 6–23)
CO2: 32 mEq/L (ref 19–32)
Calcium: 9.3 mg/dL (ref 8.4–10.5)
Chloride: 102 mEq/L (ref 96–112)
Creatinine, Ser: 1.08 mg/dL (ref 0.40–1.20)
GFR: 52.13 mL/min — ABNORMAL LOW (ref >60.00)
Glucose, Bld: 94 mg/dL (ref 70–99)
Potassium: 3.8 mEq/L (ref 3.5–5.1)
Sodium: 142 mEq/L (ref 135–145)
Total Bilirubin: 0.6 mg/dL (ref 0.2–1.2)
Total Protein: 6.6 g/dL (ref 6.0–8.3)

## 2022-04-16 LAB — LIPID PANEL
Cholesterol: 221 mg/dL — ABNORMAL HIGH (ref 0–200)
HDL: 50.6 mg/dL (ref >39.00)
LDL Cholesterol: 131 mg/dL — ABNORMAL HIGH (ref 0–99)
NonHDL: 170.04
Total CHOL/HDL Ratio: 4
Triglycerides: 197 mg/dL — ABNORMAL HIGH (ref 0.0–149.0)
VLDL: 39.4 mg/dL (ref 0.0–40.0)

## 2022-04-16 LAB — TSH: TSH: 1.07 u[IU]/mL (ref 0.35–5.50)

## 2022-04-16 NOTE — Assessment & Plan Note (Signed)
Overall improved but with continued auras.  Continue Emgality 120 mg monthly, continue nortriptyline 30 mg HS, Sumatriptan 50 mg PRN.

## 2022-04-16 NOTE — Assessment & Plan Note (Signed)
Controlled.  Continue amlodipine 2.5 mg daily. 

## 2022-04-16 NOTE — Assessment & Plan Note (Addendum)
Overall controlled.  Continue hydroxyzine 10 mg PRN.

## 2022-04-16 NOTE — Progress Notes (Signed)
Subjective:    Patient ID: Christie Wells, female    DOB: 03/15/1951, 72 y.o.   MRN: [id number]  HPI  Christie Wells is a very pleasant 72 y.o. female who presents today for complete physical and follow up of chronic conditions.  Immunizations: -Tetanus: Completed in 2016 -Influenza: Completed this season -Shingles: Completed Shingrix series -Pneumonia: Completed Prevnar 13 in 2020 and Pneumovax 23 in 2021  Diet: [place].  Exercise: No regular exercise.  Eye exam: Completes annually  Dental exam: Completes semi-annually   Mammogram: Completed in February 2023  Colonoscopy: Completed in 2023, due 2026 Dexa: Completed in October 2023  BP Readings from Last 3 Encounters:  04/16/22 110/86  02/17/22 (!) 144/82  01/20/22 136/87        Review of Systems  Constitutional:  Negative for unexpected weight change.  HENT:  Negative for rhinorrhea.   Respiratory:  Negative for cough and shortness of breath.   Cardiovascular:  Negative for chest pain.  Gastrointestinal:  Negative for constipation and diarrhea.  Genitourinary:  Negative for difficulty urinating.  Musculoskeletal:  Positive for arthralgias. Negative for myalgias.  Skin:  Negative for rash.  Allergic/Immunologic: Positive for environmental allergies.  Neurological:  Negative for dizziness and headaches.  Psychiatric/Behavioral:  The patient is nervous/anxious.          Past Medical History:  Diagnosis Date   Arthritis    generalized   Blood transfusion without reported diagnosis 1953   Cataract 2022   bilateral sx   Chronic headaches    Colitis 1984   COVID-19 12/02/2021   COVID-19 virus infection 10/10/2020   GERD (gastroesophageal reflux disease)    on meds   Hypertension    on meds   Hypothyroidism    on meds   Migraines    Peripheral neuropathy    bilateral feet   Personal history of atrial flutter    had ablation sx 02/2020   TIA (transient ischemic attack)    UTI (urinary tract  infection)     Social History   Socioeconomic History   Marital status: Divorced    Spouse name: Not on file   Number of children: 2   Years of education: Not on file   Highest education level: Not on file  Occupational History   Not on file  Tobacco Use   Smoking status: Never   Smokeless tobacco: Never  Vaping Use   Vaping Use: Never used  Substance and Sexual Activity   Alcohol use: Yes    Alcohol/week: 0.0 - 2.0 standard drinks of alcohol   Drug use: No   Sexual activity: Not on file  Other Topics Concern   Not on file  Social History Narrative   Divorced.   2 children.   Moved from [place].   Once worked as an Journalist, newspaper.       Social Determinants of Health   Financial Resource Strain: Low Risk  (11/13/2021)   Overall Financial Resource Strain (CARDIA)    Difficulty of Paying Living Expenses: Not hard at all  Food Insecurity: No Food Insecurity (11/13/2021)   Hunger Vital Sign    Worried About Running Out of Food in the Last Year: Never true    Ran Out of Food in the Last Year: Never true  Transportation Needs: No Transportation Needs (11/13/2021)   PRAPARE - Hydrologist (Medical): No    Lack of Transportation (Non-Medical): No  Physical Activity: Sufficiently Active (11/13/2021)  Exercise Vital Sign    Days of Exercise per Week: 4 days    Minutes of Exercise per Session: 50 min  Stress: No Stress Concern Present (11/13/2021)   [place]    Feeling of Stress : Not at all  Social Connections: Moderately Isolated (11/13/2021)   Social Connection and Isolation Panel [NHANES]    Frequency of Communication with Friends and Family: More than three times a week    Frequency of Social Gatherings with Friends and Family: More than three times a week    Attends Religious Services: Never    Marine scientist or Organizations: Yes    Attends Programme researcher, broadcasting/film/video: More than 4 times per year    Marital Status: Divorced  Intimate Partner Violence: Not At Risk (11/13/2021)   Humiliation, Afraid, Rape, and Kick questionnaire    Fear of Current or Ex-Partner: No    Emotionally Abused: No    Physically Abused: No    Sexually Abused: No    Past Surgical History:  Procedure Laterality Date   BUNIONECTOMY Bilateral    CARDIAC ELECTROPHYSIOLOGY STUDY AND ABLATION  02/22/2020   CATARACT EXTRACTION, BILATERAL Bilateral 2022   COLONOSCOPY  2015   COLONOSCOPY  2020   KB-MAC-Suprep(exc)-SSA/TA   FOOT SURGERY Bilateral 2010   5 screws  and again in 2012   POLYPECTOMY  2020   SSA/TA   TUBAL LIGATION     UPPER GI ENDOSCOPY     Gastritis   WISDOM TOOTH EXTRACTION      Family History  Problem Relation Age of Onset   Alzheimer's disease Mother    Hypertension Mother    Hyperlipidemia Mother    COPD Mother    Aneurysm Mother    Stroke Father    Hypertension Father    Aneurysm Brother    Heart disease Maternal Grandmother    Parkinson's disease Paternal Grandfather    Colon cancer Neg Hx    Esophageal cancer Neg Hx    Breast cancer Neg Hx    Rectal cancer Neg Hx    Stomach cancer Neg Hx     Allergies  Allergen Reactions   Codeine Nausea And Vomiting    Other reaction(s): vomiting    Peanut-Containing Drug Products     Headaches   Sulfa Antibiotics Rash    Other reaction(s): rash   Topamax [Topiramate] Other (See Comments)    Dizzy, numbness/tingling    Current Outpatient Medications on File Prior to Visit  Medication Sig Dispense Refill   ACETAMINOPHEN-BUTALBITAL 50-325 MG TABS Take by mouth. 50-325-40   as needed     amLODipine (NORVASC) 2.5 MG tablet TAKE 1 TABLET IN THE MORNING AND AT BEDTIME FOR BLOOD PRESSURE 180 tablet 3   butalbital-acetaminophen-caffeine (FIORICET) 50-325-40 MG tablet Take 1 tablet by mouth every 4 (four) hours as needed. Take 1 tablet at headache onset, can repeat after 4 hours. No more  than 2 pills in 24 hours. Do not take more than 2-3 times a week MAXIMUM.     CALCIUM-VITAMIN D PO Take 1 tablet by mouth daily at 6 (six) AM.     celecoxib (CELEBREX) 100 MG capsule Take 100 mg by mouth 2 (two) times daily as needed.     cholecalciferol (VITAMIN D) 1000 units tablet Take 3,000 Units by mouth daily.     cyanocobalamin 1000 MCG tablet Take 1,000 mcg by mouth daily.     EMGALITY 120 MG/ML SOAJ  Inject 1 mL into the skin every 30 (thirty) days.     gabapentin (NEURONTIN) 100 MG capsule Take 100 mg by mouth at bedtime.     hydrOXYzine (ATARAX/VISTARIL) 10 MG tablet TAKE 1-2 TABLETS (10-20 MG TOTAL) BY MOUTH 2 (TWO) TIMES DAILY AS NEEDED FOR ANXIETY. 90 tablet 0   lansoprazole (PREVACID) 30 MG capsule TAKE 1 CAPSULE TWICE A DAY BEFORE MEALS FOR HEARTBURN 180 capsule 0   levothyroxine (SYNTHROID) 88 MCG tablet TAKE 1 TAB EVERY MORNING ON AN EMPTY STOMACH WITH WATER ONLY. NO FOOD OR OTHER MEDS FOR 30 MIN 90 tablet 0   nortriptyline (PAMELOR) 10 MG capsule Take 30 mg by mouth daily at 6 (six) AM.     rizatriptan (MAXALT) 10 MG tablet Take 10 mg by mouth as needed for migraine.     SUMAtriptan (IMITREX) 50 MG tablet 1 TABLET BY MOUTH AT MIGRAINE ONSET MAY REPEAT WITH SECOND TABLET IN 2 HOURS IF MIGRAINE PERSIST 10 tablet 0   Ubrogepant 100 MG TABS Take by mouth.     fluticasone (FLONASE) 50 MCG/ACT nasal spray Place 2 sprays into both nostrils daily. (Patient not taking: Reported on 01/20/2022) 16 g 6   No current facility-administered medications on file prior to visit.    BP 110/86   Pulse 82   Temp 98.1 F (36.7 C) (Temporal)   Ht 5' 7"  (1.702 m)   Wt 173 lb (78.5 kg)   SpO2 98%   BMI 27.10 kg/m  Objective:   Physical Exam HENT:     Right Ear: Tympanic membrane and ear canal normal.     Left Ear: Tympanic membrane and ear canal normal.     Nose: Nose normal.  Eyes:     Conjunctiva/sclera: Conjunctivae normal.     Pupils: Pupils are equal, round, and reactive to light.   Neck:     Thyroid: No thyromegaly.  Cardiovascular:     Rate and Rhythm: Normal rate and regular rhythm.     Heart sounds: No murmur heard. Pulmonary:     Effort: Pulmonary effort is normal.     Breath sounds: Normal breath sounds. No rales.  Abdominal:     General: Bowel sounds are normal.     Palpations: Abdomen is soft.     Tenderness: There is no abdominal tenderness.  Musculoskeletal:        General: Normal range of motion.     Cervical back: Neck supple.  Lymphadenopathy:     Cervical: No cervical adenopathy.  Skin:    General: Skin is warm and dry.     Findings: No rash.  Neurological:     Mental Status: She is alert and oriented to person, place, and time.     Cranial Nerves: No cranial nerve deficit.     Deep Tendon Reflexes: Reflexes are normal and symmetric.  Psychiatric:        Mood and Affect: Mood normal.           Assessment & Plan:  Preventative health care Assessment & Plan: Immunizations UTD. Mammogram deferred per patient request. Repeat in 2025. Bone density scan UTD. Colonoscopy UTD, due 2026  Discussed the importance of a healthy diet and regular exercise in order for weight loss, and to reduce the risk of further co-morbidity.  Exam stable. Labs pending.  Follow up in 1 year for repeat physical.    Essential hypertension Assessment & Plan: Controlled.  Continue amlodipine 2.5 mg daily.   Orders: -  Comprehensive metabolic panel  Paroxysmal atrial fibrillation (HCC) Assessment & Plan: Rate and rhythm regular today. Following with cardiology.   Remain off Eliquis.   Migraine with aura and without status migrainosus, not intractable Assessment & Plan: Overall improved but with continued auras.  Continue Emgality 120 mg monthly, continue nortriptyline 30 mg HS, Sumatriptan 50 mg PRN.    Chronic low back pain, unspecified back pain laterality, unspecified whether sciatica present Assessment &  Plan: Controlled.  Continue Celebrex 200 mg HS PRN and gabapentin 100 mg HS PRN.   Hypothyroidism, unspecified type Assessment & Plan: She is taking levothyroxine correctly. Continue levothyroxine 88 mcg daily. Repeat TSH pending.  Orders: -     TSH  Gastroesophageal reflux disease without esophagitis Assessment & Plan: Controlled.  Continue Prevacid 30 mg BID.    Hyperlipidemia, unspecified hyperlipidemia type Assessment & Plan: Repeat lipid panel pending.  Orders: -     Lipid panel  Anxiety Assessment & Plan: Overall controlled.  Continue hydroxyzine 10 mg PRN.   Microscopic hematuria Assessment & Plan: Urine microscopic negative.         Pleas Koch, NP

## 2022-04-16 NOTE — Patient Instructions (Addendum)
Stop by the lab prior to leaving today. I will notify you of your results once received.   It was a pleasure to see you today!  

## 2022-04-16 NOTE — Assessment & Plan Note (Signed)
Immunizations UTD. Mammogram deferred per patient request. Repeat in 2025. Bone density scan UTD. Colonoscopy UTD, due 2026  Discussed the importance of a healthy diet and regular exercise in order for weight loss, and to reduce the risk of further co-morbidity.  Exam stable. Labs pending.  Follow up in 1 year for repeat physical.

## 2022-04-16 NOTE — Assessment & Plan Note (Signed)
Controlled.  Continue Prevacid 30 mg BID.

## 2022-04-16 NOTE — Assessment & Plan Note (Signed)
Repeat lipid panel pending. 

## 2022-04-16 NOTE — Assessment & Plan Note (Signed)
Urine microscopic negative.

## 2022-04-16 NOTE — Assessment & Plan Note (Signed)
She is taking levothyroxine correctly. Continue levothyroxine 88 mcg daily. Repeat TSH pending.

## 2022-04-16 NOTE — Assessment & Plan Note (Addendum)
Controlled.  Continue Celebrex 200 mg HS PRN and gabapentin 100 mg HS PRN.

## 2022-04-16 NOTE — Assessment & Plan Note (Signed)
Rate and rhythm regular today. Following with cardiology.   Remain off Eliquis.

## 2022-04-17 ENCOUNTER — Other Ambulatory Visit: Payer: Self-pay | Admitting: Primary Care

## 2022-04-17 DIAGNOSIS — F419 Anxiety disorder, unspecified: Secondary | ICD-10-CM

## 2022-04-17 MED ORDER — HYDROXYZINE HCL 10 MG PO TABS: 10.0000 mg | ORAL_TABLET | Freq: Two times a day (BID) | ORAL | 0 refills | Status: DC | PRN

## 2022-04-17 NOTE — Telephone Encounter (Signed)
From: Odette Fraction Sopher To: Office of Pleas Koch, NP Sent: 04/17/2022 12:58 AM EST Subject: Medication Renewal Request  Refills have been requested for the following medications:   hydrOXYzine (ATARAX/VISTARIL) 10 MG tablet [Lincy Belles K Wilbert Hayashi]  [facility]erred pharmacy[id number] [place]: PArlyss Gandy

## 2022-05-19 ENCOUNTER — Other Ambulatory Visit: Payer: Self-pay | Admitting: Primary Care

## 2022-05-19 DIAGNOSIS — F419 Anxiety disorder, unspecified: Secondary | ICD-10-CM

## 2022-05-19 NOTE — Telephone Encounter (Signed)
From: Odette Fraction Ruby To: Office of Pleas Koch, NP Sent: 05/19/2022 1:57 AM EST Subject: Medication Renewal Request  Refills have been requested for the following medications:   hydrOXYzine (ATARAX) 10 MG tablet [Javarion Douty K Christo Hain]  [id number]: PArlyss Gandy

## 2022-05-19 NOTE — Telephone Encounter (Signed)
Noted  

## 2022-05-19 NOTE — Telephone Encounter (Signed)
Please call patient:  Received refill request for hydroxyzine medication that is take PRN for anxiety.  Last refill was early February 2024. How is her anxiety? How often is she using the hydroxyzine and how many pills is she taking at a time?

## 2022-05-19 NOTE — Telephone Encounter (Signed)
PA Submitted  KEYShelly Flatten- [id number] Awaiting determination

## 2022-05-19 NOTE — Telephone Encounter (Signed)
Unable to reach patient. Left voicemail to return call to our office.   

## 2022-05-19 NOTE — Telephone Encounter (Signed)
Patient states she does not take them often at all, maybe 30 tablets in a years time. She did not realize we sent in a refill on February. She will check with pharmacy or see if she picked this up and forgot. Okay to refuse.

## 2022-05-20 NOTE — Telephone Encounter (Signed)
PA for hydroxyzine hcl '10mg'$  tablets has been denied.  See denial reason below:

## 2022-05-20 NOTE — Telephone Encounter (Signed)
Received denial from PA for hydroxyzine '10mg'$  tab due to patient not trying 2 other FDA approved medications for anxiety first. Gave patient information for Good Rx coupon for this medication to get at a low cost.  She now would like to consider a different anxiety medication that will not make her drowsy. She has been made aware that you are out of the office and may be a delay in response time.

## 2022-06-05 ENCOUNTER — Other Ambulatory Visit: Payer: Self-pay | Admitting: Primary Care

## 2022-06-05 DIAGNOSIS — K219 Gastro-esophageal reflux disease without esophagitis: Secondary | ICD-10-CM

## 2022-06-11 ENCOUNTER — Ambulatory Visit (INDEPENDENT_AMBULATORY_CARE_PROVIDER_SITE_OTHER): Payer: Medicare Other | Admitting: Family Medicine

## 2022-06-11 ENCOUNTER — Encounter: Payer: Self-pay | Admitting: Family Medicine

## 2022-06-11 VITALS — BP 128/88 | HR 79 | Temp 98.0°F | Ht 67.0 in | Wt 171.5 lb

## 2022-06-11 DIAGNOSIS — J069 Acute upper respiratory infection, unspecified: Secondary | ICD-10-CM | POA: Diagnosis not present

## 2022-06-11 MED ORDER — BENZONATATE 200 MG PO CAPS: 200.0000 mg | ORAL_CAPSULE | Freq: Three times a day (TID) | ORAL | 1 refills | Status: DC | PRN

## 2022-06-11 NOTE — Patient Instructions (Addendum)
Drink fluids and rest  mucinex is good for congestion / to expectorate  Tessalon for cough  Nasal saline for congestion as needed  Breathe steam  Tylenol for fever or pain or headache  Please alert [place] if symptoms worsen (if severe or short of breath please go to the ER)    Update if not starting to improve in a week or if worsening  Watch for wheezing, shortness of breath , fever, facial pain

## 2022-06-11 NOTE — Progress Notes (Signed)
Subjective:    Patient ID: Christie Wells, female    DOB: 07-23-1950, 72 y.o.   MRN: [id number]  HPI 72 yo pt of Np Carlis Abbott presents with uri symptoms She has h/o a heart ablation in the past   Wt Readings from Last 3 Encounters:  06/11/22 171 lb 8 oz (77.8 kg)  04/16/22 173 lb (78.5 kg)  02/17/22 174 lb (78.9 kg)   26.86 kg/m  Vitals:   06/11/22 0853  BP: 128/88  Pulse: 79  Temp: 98 F (36.7 C)  SpO2: 100%    Covid neg at home  No fever   Symptoms started Sunday night  Runny nose Stuffy nose First 3 days were most miserable   Now starting to cough (pretty mild so far)  So far non productive  A little rattle - but not moving yet  No wheezing  No sob   Throat- was very sore first 2 d, better now  Ears - pressure    Prone to bronchitis  Non smoker Had some 2nd hand smoke when young   Takes care of grandkids   Is very gun shy about lung symptoms because she got so sick with covid in 2022 and had some interstitial thickening on CXR Reviewed this imaging with her today    Taking: Flonase = not using   Patient Active Problem List   Diagnosis Date Noted   Microscopic hematuria 02/17/2022   Pulsatile tinnitus of both ears 07/15/2021   Viral URI with cough 01/09/2021   Seasonal allergic rhinitis 01/09/2021   Hemorrhoids 07/09/2020   Hair thinning 05/03/2020   Paroxysmal atrial fibrillation (HCC) 03/04/2020   Anxiety 01/16/2020   Atrial flutter ([place]) 10/18/2019   Tendonitis of right hand 09/07/2019   Hyperlipidemia 06/20/2019   Essential hypertension 04/18/2019   Multiple nevi 01/11/2019   Preventative health care 12/28/2018   Chronic foot pain 11/04/2018   Palpitations 11/04/2018   Dizziness 11/04/2018   Migraines 10/05/2017   Gastroesophageal reflux disease 10/05/2017   Chronic back pain 07/14/2017   Hypothyroidism 04/21/2017   Vitamin B 12 deficiency 04/21/2017   Vitamin D deficiency 04/21/2017   Past Medical History:  Diagnosis Date    Arthritis    generalized   Blood transfusion without reported diagnosis 1953   Cataract 2022   bilateral sx   Chronic headaches    Colitis 1984   COVID-19 12/02/2021   COVID-19 virus infection 10/10/2020   GERD (gastroesophageal reflux disease)    on meds   Hypertension    on meds   Hypothyroidism    on meds   Migraines    Peripheral neuropathy    bilateral feet   Personal history of atrial flutter    had ablation sx 02/2020   TIA (transient ischemic attack)    UTI (urinary tract infection)    Past Surgical History:  Procedure Laterality Date   BUNIONECTOMY Bilateral    CARDIAC ELECTROPHYSIOLOGY STUDY AND ABLATION  02/22/2020   CATARACT EXTRACTION, BILATERAL Bilateral 2022   COLONOSCOPY  2015   COLONOSCOPY  2020   KB-MAC-Suprep(exc)-SSA/TA   FOOT SURGERY Bilateral 2010   5 screws  and again in 2012   POLYPECTOMY  2020   SSA/TA   TUBAL LIGATION     UPPER GI ENDOSCOPY     Gastritis   WISDOM TOOTH EXTRACTION     Social History   Tobacco Use   Smoking status: Never   Smokeless tobacco: Never  Vaping Use   Vaping Use: Never  used  Substance Use Topics   Alcohol use: Yes    Alcohol/week: 0.0 - 2.0 standard drinks of alcohol   Drug use: No   Family History  Problem Relation Age of Onset   Alzheimer's disease Mother    Hypertension Mother    Hyperlipidemia Mother    COPD Mother    Aneurysm Mother    Stroke Father    Hypertension Father    Aneurysm Brother    Heart disease Maternal Grandmother    Parkinson's disease Paternal Grandfather    Colon cancer Neg Hx    Esophageal cancer Neg Hx    Breast cancer Neg Hx    Rectal cancer Neg Hx    Stomach cancer Neg Hx    Allergies  Allergen Reactions   Codeine Nausea And Vomiting    Other reaction(s): vomiting    Peanut-Containing Drug Products     Headaches   Sulfa Antibiotics Rash    Other reaction(s): rash   Topamax [Topiramate] Other (See Comments)    Dizzy, numbness/tingling   Current Outpatient  Medications on File Prior to Visit  Medication Sig Dispense Refill   ACETAMINOPHEN-BUTALBITAL 50-325 MG TABS Take by mouth. 50-325-40   as needed     amLODipine (NORVASC) 2.5 MG tablet TAKE 1 TABLET IN THE MORNING AND AT BEDTIME FOR BLOOD PRESSURE 180 tablet 3   butalbital-acetaminophen-caffeine (FIORICET) 50-325-40 MG tablet Take 1 tablet by mouth every 4 (four) hours as needed. Take 1 tablet at headache onset, can repeat after 4 hours. No more than 2 pills in 24 hours. Do not take more than 2-3 times a week MAXIMUM.     CALCIUM-VITAMIN D PO Take 1 tablet by mouth daily at 6 (six) AM.     celecoxib (CELEBREX) 100 MG capsule Take 100 mg by mouth 2 (two) times daily as needed.     cholecalciferol (VITAMIN D) 1000 units tablet Take 3,000 Units by mouth daily.     cyanocobalamin 1000 MCG tablet Take 1,000 mcg by mouth daily.     EMGALITY 120 MG/ML SOAJ Inject 1 mL into the skin every 30 (thirty) days.     fluticasone (FLONASE) 50 MCG/ACT nasal spray Place 2 sprays into both nostrils daily. 16 g 6   gabapentin (NEURONTIN) 100 MG capsule Take 100 mg by mouth at bedtime.     hydrOXYzine (ATARAX) 10 MG tablet Take 1-2 tablets (10-20 mg total) by mouth 2 (two) times daily as needed for anxiety. 90 tablet 0   lansoprazole (PREVACID) 30 MG capsule TAKE 1 CAPSULE TWICE A DAY BEFORE MEALS FOR HEARTBURN 180 capsule 2   levothyroxine (SYNTHROID) 88 MCG tablet TAKE 1 TAB EVERY MORNING ON AN EMPTY STOMACH WITH WATER ONLY. NO FOOD OR OTHER MEDS FOR 30 MIN 90 tablet 0   nortriptyline (PAMELOR) 10 MG capsule Take 30 mg by mouth daily at 6 (six) AM.     rizatriptan (MAXALT) 10 MG tablet Take 10 mg by mouth as needed for migraine.     SUMAtriptan (IMITREX) 50 MG tablet 1 TABLET BY MOUTH AT MIGRAINE ONSET MAY REPEAT WITH SECOND TABLET IN 2 HOURS IF MIGRAINE PERSIST 10 tablet 0   No current facility-administered medications on file prior to visit.    Review of Systems  Constitutional:  Positive for fatigue. Negative  for appetite change and fever.  HENT:  Positive for congestion, postnasal drip, rhinorrhea, sneezing and sore throat. Negative for ear discharge, ear pain, sinus pressure and trouble swallowing.   Eyes:  Negative  for pain and discharge.  Respiratory:  Positive for cough. Negative for shortness of breath, wheezing and stridor.   Cardiovascular:  Negative for chest pain.  Gastrointestinal:  Negative for diarrhea, nausea and vomiting.  Genitourinary:  Negative for frequency, hematuria and urgency.  Musculoskeletal:  Negative for arthralgias and myalgias.  Skin:  Negative for rash.  Neurological:  Positive for headaches. Negative for dizziness, weakness and light-headedness.  Psychiatric/Behavioral:  Negative for confusion and dysphoric mood.        Objective:   Physical Exam Constitutional:      General: She is not in acute distress.    Appearance: Normal appearance. She is well-developed and normal weight. She is not ill-appearing, toxic-appearing or diaphoretic.  HENT:     Head: Normocephalic and atraumatic.     Comments: No facial swelling or tenderness    Right Ear: Tympanic membrane, ear canal and external ear normal.     Left Ear: Tympanic membrane, ear canal and external ear normal.     Nose: Congestion and rhinorrhea present.     Mouth/Throat:     Mouth: Mucous membranes are moist.     Pharynx: Oropharynx is clear. No oropharyngeal exudate or posterior oropharyngeal erythema.     Comments: Clear pnd  Eyes:     General:        Right eye: No discharge.        Left eye: No discharge.     Conjunctiva/sclera: Conjunctivae normal.     Pupils: Pupils are equal, round, and reactive to light.  Cardiovascular:     Rate and Rhythm: Normal rate.     Heart sounds: Normal heart sounds.  Pulmonary:     Effort: Pulmonary effort is normal. No respiratory distress.     Breath sounds: Normal breath sounds. No stridor. No wheezing, rhonchi or rales.     Comments: Good air exch No rales or  rhonchi Few upper airway sounds    Chest:     Chest wall: No tenderness.  Musculoskeletal:     Cervical back: Normal range of motion and neck supple.  Lymphadenopathy:     Cervical: No cervical adenopathy.  Skin:    General: Skin is warm and dry.     Capillary Refill: Capillary refill takes less than 2 seconds.     Findings: No rash.  Neurological:     Mental Status: She is alert.     Cranial Nerves: No cranial nerve deficit.  Psychiatric:        Mood and Affect: Mood normal.           Assessment & Plan:   Problem List Items Addressed This Visit       Respiratory   Viral URI with cough - Primary    Day 5 with nasal symptoms Now starting to cough/ dry  Reassuring exam Neg covid test at home and no fever  Disc sympt care-see [facility] ER precautions/call back precautions disc in detail  Handout given Tessalon for cough prn  Update if not starting to improve in a week or if worsening

## 2022-06-11 NOTE — Assessment & Plan Note (Signed)
Day 5 with nasal symptoms Now starting to cough/ dry  Reassuring exam Neg covid test at home and no fever  Disc sympt care-see [facility] ER precautions/call back precautions disc in detail  Handout given Tessalon for cough prn  Update if not starting to improve in a week or if worsening

## 2022-06-13 ENCOUNTER — Other Ambulatory Visit: Payer: Self-pay | Admitting: Primary Care

## 2022-06-13 DIAGNOSIS — F419 Anxiety disorder, unspecified: Secondary | ICD-10-CM

## 2022-06-20 ENCOUNTER — Other Ambulatory Visit: Payer: Self-pay | Admitting: Primary Care

## 2022-06-20 DIAGNOSIS — E039 Hypothyroidism, unspecified: Secondary | ICD-10-CM

## 2022-07-02 ENCOUNTER — Encounter: Payer: Self-pay | Admitting: Primary Care

## 2022-07-02 ENCOUNTER — Ambulatory Visit (INDEPENDENT_AMBULATORY_CARE_PROVIDER_SITE_OTHER): Payer: Medicare Other | Admitting: Primary Care

## 2022-07-02 VITALS — BP 118/64 | HR 77 | Temp 96.6°F | Ht 67.0 in | Wt 171.0 lb

## 2022-07-02 DIAGNOSIS — F419 Anxiety disorder, unspecified: Secondary | ICD-10-CM

## 2022-07-02 DIAGNOSIS — I499 Cardiac arrhythmia, unspecified: Secondary | ICD-10-CM

## 2022-07-02 DIAGNOSIS — I480 Paroxysmal atrial fibrillation: Secondary | ICD-10-CM

## 2022-07-02 NOTE — Assessment & Plan Note (Addendum)
Rate and rhythm regular today. Vital signs within normal limits.   Recommended ECG and Holter monitor for which she kindly declined. She would like to monitor for now. She does suspect that symptoms are secondary to pulsatile tinnitus.   Reviewed labs from February 2024.  She will report back if symptoms progress and/or if she develops additional symptoms.  I have also asked for her to try learn if symptoms can be provoked as she only notices them in a certain position in bed.

## 2022-07-02 NOTE — Progress Notes (Signed)
Subjective:    Patient ID: Christie Wells, female    DOB: 05/31/1950, 72 y.o.   MRN: [id number]  HPI  Christie Wells is a very pleasant 72 y.o. female with a history of atrial flutter, paroxysmal atrial fibrillation, GERD, hypothyroidism, GAD, hyperlipidemia, neuropathy, pulsatile tinnitus, palpitations who presents today to discuss irregular heart beat.  Over the last three weeks she's noticed intermittent "skipping beats" to her entire body when sitting in bed leaning against her headboard at night. She's been checking her pulse on her left wrist and right jugular region and has felt skipped beats. She's does not track her HR on her smart watch.   She denies sensation of skipped beats and does not notice her pulsatile tinnitus during the day. She denies anxiety during the night while laying in bed, increased caffeine consumption. She drinks plenty of water. She denies shortness of breath, dizziness. She is able to exercise regularly without symptoms.   Following with [facility] and is s/p ablation of atrial flutter in 2021. Last office visit was in September 2023. She has known to be in sinus rhythm since her ablation and is not managed on anticoagulation or beta blocker treatment.   She has pulsatile tinnitus, follows with ENT.   BP Readings from Last 3 Encounters:  07/02/22 118/64  06/11/22 128/88  04/16/22 110/86     Review of Systems  Constitutional:  Negative for fatigue.  Eyes:  Negative for visual disturbance.  Respiratory:  Negative for chest tightness.   Cardiovascular:  Negative for palpitations.  Neurological:  Negative for dizziness and headaches.  Psychiatric/Behavioral:  The patient is not nervous/anxious.          Past Medical History:  Diagnosis Date   Arthritis    generalized   Blood transfusion without reported diagnosis 1953   Cataract 2022   bilateral sx   Chronic headaches    Colitis 1984   COVID-19 12/02/2021   COVID-19 virus  infection 10/10/2020   GERD (gastroesophageal reflux disease)    on meds   Hypertension    on meds   Hypothyroidism    on meds   Migraines    Peripheral neuropathy    bilateral feet   Personal history of atrial flutter    had ablation sx 02/2020   TIA (transient ischemic attack)    UTI (urinary tract infection)    Viral URI with cough 01/09/2021    Social History   Socioeconomic History   Marital status: Divorced    Spouse name: Not on file   Number of children: 2   Years of education: Not on file   Highest education level: Master's degree (e.g., MA, MS, MEng, MEd, MSW, MBA)  Occupational History   Not on file  Tobacco Use   Smoking status: Never   Smokeless tobacco: Never  Vaping Use   Vaping Use: Never used  Substance and Sexual Activity   Alcohol use: Yes    Alcohol/week: 0.0 - 2.0 standard drinks of alcohol   Drug use: No   Sexual activity: Not on file  Other Topics Concern   Not on file  Social History Narrative   Divorced.   2 children.   Moved from [place].   Once worked as an Runner, broadcasting/film/video.       Social Determinants of Health   Financial Resource Strain: Low Risk  (06/10/2022)   Overall Financial Resource Strain (CARDIA)    Difficulty of Paying Living Expenses: Not hard at all  Food Insecurity: No Food Insecurity (06/10/2022)   Hunger Vital Sign    Worried About Running Out of Food in the Last Year: Never true    Ran Out of Food in the Last Year: Never true  Transportation Needs: No Transportation Needs (06/10/2022)   PRAPARE - Administrator, Civil Service (Medical): No    Lack of Transportation (Non-Medical): No  Physical Activity: Insufficiently Active (06/10/2022)   Exercise Vital Sign    Days of Exercise per Week: 3 days    Minutes of Exercise per Session: 40 min  Stress: No Stress Concern Present (06/10/2022)   [employer] of Occupational Health - Occupational Stress Questionnaire    Feeling of Stress : Not at all   Social Connections: Moderately Integrated (06/10/2022)   Social Connection and Isolation Panel [NHANES]    Frequency of Communication with Friends and Family: More than three times a week    Frequency of Social Gatherings with Friends and Family: Once a week    Attends Religious Services: 1 to 4 times per year    Active Member of [employer] or Organizations: Yes    Attends Banker Meetings: 1 to 4 times per year    Marital Status: Divorced  Catering manager Violence: Not At Risk (11/13/2021)   Humiliation, Afraid, Rape, and Kick questionnaire    Fear of Current or Ex-Partner: No    Emotionally Abused: No    Physically Abused: No    Sexually Abused: No    Past Surgical History:  Procedure Laterality Date   BUNIONECTOMY Bilateral    CARDIAC ELECTROPHYSIOLOGY STUDY AND ABLATION  02/22/2020   CATARACT EXTRACTION, BILATERAL Bilateral 2022   COLONOSCOPY  2015   COLONOSCOPY  2020   KB-MAC-Suprep(exc)-SSA/TA   FOOT SURGERY Bilateral 2010   5 screws  and again in 2012   POLYPECTOMY  2020   SSA/TA   TUBAL LIGATION     UPPER GI ENDOSCOPY     Gastritis   WISDOM TOOTH EXTRACTION      Family History  Problem Relation Age of Onset   Alzheimer's disease Mother    Hypertension Mother    Hyperlipidemia Mother    COPD Mother    Aneurysm Mother    Stroke Father    Hypertension Father    Aneurysm Brother    Heart disease Maternal Grandmother    Parkinson's disease Paternal Grandfather    Colon cancer Neg Hx    Esophageal cancer Neg Hx    Breast cancer Neg Hx    Rectal cancer Neg Hx    Stomach cancer Neg Hx     Allergies  Allergen Reactions   Codeine Nausea And Vomiting    Other reaction(s): vomiting    Peanut-Containing Drug Products     Headaches   Sulfa Antibiotics Rash    Other reaction(s): rash   Topamax [Topiramate] Other (See Comments)    Dizzy, numbness/tingling    Current Outpatient Medications on File Prior to Visit  Medication Sig Dispense Refill    ACETAMINOPHEN-BUTALBITAL 50-325 MG TABS Take by mouth. 50-325-40   as needed     amLODipine (NORVASC) 2.5 MG tablet TAKE 1 TABLET IN THE MORNING AND AT BEDTIME FOR BLOOD PRESSURE 180 tablet 3   butalbital-acetaminophen-caffeine (FIORICET) 50-325-40 MG tablet Take 1 tablet by mouth every 4 (four) hours as needed. Take 1 tablet at headache onset, can repeat after 4 hours. No more than 2 pills in 24 hours. Do not take more than 2-3 times a  week MAXIMUM.     CALCIUM-VITAMIN D PO Take 1 tablet by mouth daily at 6 (six) AM.     cholecalciferol (VITAMIN D) 1000 units tablet Take 3,000 Units by mouth daily.     cyanocobalamin 1000 MCG tablet Take 1,000 mcg by mouth daily.     gabapentin (NEURONTIN) 100 MG capsule Take 100 mg by mouth at bedtime.     hydrOXYzine (ATARAX) 10 MG tablet Take 1-2 tablets (10-20 mg total) by mouth 2 (two) times daily as needed for anxiety. 90 tablet 0   lansoprazole (PREVACID) 30 MG capsule TAKE 1 CAPSULE TWICE A DAY BEFORE MEALS FOR HEARTBURN 180 capsule 2   levothyroxine (SYNTHROID) 88 MCG tablet TAKE 1 TAB EVERY MORNING ON AN EMPTY STOMACH WITH WATER ONLY. NO FOOD OR OTHER MEDS FOR 30 MIN 90 tablet 2   nortriptyline (PAMELOR) 10 MG capsule Take 30 mg by mouth daily at 6 (six) AM.     SUMAtriptan (IMITREX) 50 MG tablet 1 TABLET BY MOUTH AT MIGRAINE ONSET MAY REPEAT WITH SECOND TABLET IN 2 HOURS IF MIGRAINE PERSIST 10 tablet 0   benzonatate (TESSALON) 200 MG capsule Take 1 capsule (200 mg total) by mouth 3 (three) times daily as needed for cough. Swallow whole (Patient not taking: Reported on 07/02/2022) 30 capsule 1   celecoxib (CELEBREX) 100 MG capsule Take 100 mg by mouth 2 (two) times daily as needed. (Patient not taking: Reported on 07/02/2022)     EMGALITY 120 MG/ML SOAJ Inject 1 mL into the skin every 30 (thirty) days. (Patient not taking: Reported on 07/02/2022)     fluticasone (FLONASE) 50 MCG/ACT nasal spray Place 2 sprays into both nostrils daily. (Patient not taking:  Reported on 07/02/2022) 16 g 6   rizatriptan (MAXALT) 10 MG tablet Take 10 mg by mouth as needed for migraine. (Patient not taking: Reported on 07/02/2022)     No current facility-administered medications on file prior to visit.    BP 118/64   Pulse 77   Temp (!) 96.6 F (35.9 C) (Temporal)   Ht  (1.702 m)   Wt 171 lb (77.6 kg)   SpO2 99%   BMI 26.78 kg/m  Objective:   Physical Exam Cardiovascular:     Rate and Rhythm: Normal rate and regular rhythm.     Heart sounds: No murmur heard.    No gallop.  Pulmonary:     Effort: Pulmonary effort is normal.  Musculoskeletal:     Cervical back: Neck supple.  Skin:    General: Skin is warm and dry.  Psychiatric:        Mood and Affect: Mood normal.           Assessment & Plan:  Paroxysmal atrial fibrillation Assessment & Plan: Rate and rhythm regular today. Recommended ECG today for which she declines.     Irregular heart beats Assessment & Plan: Rate and rhythm regular today. Vital signs within normal limits.   Recommended ECG and Holter monitor for which she kindly declined. She would like to monitor for now. She does suspect that symptoms are secondary to pulsatile tinnitus.   Reviewed labs from February 2024.  She will report back if symptoms progress and/or if she develops additional symptoms.  I have also asked for her to try learn if symptoms can be provoked as she only notices them in a certain position in bed.     Anxiety Assessment & Plan: Denies concerns.  Continue hydroxyzine 10 mg PRN.  Pleas Koch, NP

## 2022-07-02 NOTE — Assessment & Plan Note (Signed)
Rate and rhythm regular today. Recommended ECG today for which she declines.

## 2022-07-02 NOTE — Assessment & Plan Note (Signed)
Denies concerns.  Continue hydroxyzine 10 mg PRN.

## 2022-08-07 ENCOUNTER — Encounter: Payer: Self-pay | Admitting: Primary Care

## 2022-08-07 ENCOUNTER — Telehealth (INDEPENDENT_AMBULATORY_CARE_PROVIDER_SITE_OTHER): Payer: Medicare Other | Admitting: Primary Care

## 2022-08-07 DIAGNOSIS — U071 COVID-19: Secondary | ICD-10-CM

## 2022-08-07 MED ORDER — MOLNUPIRAVIR EUA 200MG CAPSULE: 4.0000 | ORAL_CAPSULE | Freq: Two times a day (BID) | ORAL | 0 refills | Status: DC

## 2022-08-07 NOTE — Assessment & Plan Note (Signed)
Overall appears stable, mild symptoms thus far.  Rx for Molnupirivir 200 mg, 4 capsules BID. Continue Dayquil and Nyquil.   Discussed updated CDC guidelines for quarantine. Return precautions provided.

## 2022-08-07 NOTE — Progress Notes (Signed)
Patient ID: Christie Wells, female    DOB: Apr 21, 1950, 72 y.o.   MRN: [id number]  Virtual visit completed through [employer], a video enabled telemedicine application. Due to national recommendations of social distancing due to COVID-19, a virtual visit is felt to be most appropriate for this patient at this time. Reviewed limitations, risks, security and privacy concerns of performing a virtual visit and the availability of in person appointments. I also reviewed that there may be a patient responsible charge related to this service. The patient agreed to proceed.   Patient location: home Provider location: [place] at [facility], office Persons participating in this virtual visit: patient, provider   If any vitals were documented, they were collected by patient at home unless specified below.    There were no vitals taken for this visit.   CC: Covid Positive Subjective:   HPI: Christie Wells is a 72 y.o. female with a history of hypertension, atrial flutter, atrial fibrillation, hypothyroidism, migraines, hyperlipidemia, pulsitile tinnitus presenting on 08/07/2022 for Covid Positive (Tested positive for Covid yesterday around 6:00pm/Symptoms started Tuesday morning with a scratchy throat, runny nose. Wednesday she started to have a cough. On her plane ride home yesterday, she couldn't taste anything. )  Symptom onset three days ago with scratchy throat and rhinorrhea. The following day she developed cough. She was traveling home on an airplane yesterday and loss her ability to taste anything. She took a home Covid-19 test yesterday which was positive.   She has noticed a deeper cough and fatigue. She slept about 10 hours last night. She was recently traveling on a river cruise, thinks she caught Covid from another guest on the cruise. Two of her traveling companions have tested positive.   She denies fevers. She's been taking Dayquil and Nyquil.       Relevant past medical, surgical,  family and social history reviewed and updated as indicated. Interim medical history since our last visit reviewed. Allergies and medications reviewed and updated. Outpatient Medications Prior to Visit  Medication Sig Dispense Refill   ACETAMINOPHEN-BUTALBITAL 50-325 MG TABS Take by mouth. 50-325-40   as needed     amLODipine (NORVASC) 2.5 MG tablet TAKE 1 TABLET IN THE MORNING AND AT BEDTIME FOR BLOOD PRESSURE 180 tablet 3   CALCIUM-VITAMIN D PO Take 1 tablet by mouth daily at 6 (six) AM.     celecoxib (CELEBREX) 100 MG capsule Take 100 mg by mouth 2 (two) times daily as needed.     cholecalciferol (VITAMIN D) 1000 units tablet Take 3,000 Units by mouth daily.     cyanocobalamin 1000 MCG tablet Take 1,000 mcg by mouth daily.     gabapentin (NEURONTIN) 100 MG capsule Take 100 mg by mouth at bedtime.     hydrOXYzine (ATARAX) 10 MG tablet Take 1-2 tablets (10-20 mg total) by mouth 2 (two) times daily as needed for anxiety. 90 tablet 0   lansoprazole (PREVACID) 30 MG capsule TAKE 1 CAPSULE TWICE A DAY BEFORE MEALS FOR HEARTBURN 180 capsule 2   levothyroxine (SYNTHROID) 88 MCG tablet TAKE 1 TAB EVERY MORNING ON AN EMPTY STOMACH WITH WATER ONLY. NO FOOD OR OTHER MEDS FOR 30 MIN 90 tablet 2   nortriptyline (PAMELOR) 10 MG capsule Take 30 mg by mouth daily at 6 (six) AM.     rizatriptan (MAXALT) 10 MG tablet Take 10 mg by mouth as needed for migraine.     SUMAtriptan (IMITREX) 50 MG tablet 1 TABLET BY MOUTH AT MIGRAINE  ONSET MAY REPEAT WITH SECOND TABLET IN 2 HOURS IF MIGRAINE PERSIST 10 tablet 0   benzonatate (TESSALON) 200 MG capsule Take 1 capsule (200 mg total) by mouth 3 (three) times daily as needed for cough. Swallow whole (Patient not taking: Reported on 07/02/2022) 30 capsule 1   butalbital-acetaminophen-caffeine (FIORICET) 50-325-40 MG tablet Take 1 tablet by mouth every 4 (four) hours as needed. Take 1 tablet at headache onset, can repeat after 4 hours. No more than 2 pills in 24 hours. Do not  take more than 2-3 times a week MAXIMUM. (Patient not taking: Reported on 08/07/2022)     EMGALITY 120 MG/ML SOAJ Inject 1 mL into the skin every 30 (thirty) days. (Patient not taking: Reported on 07/02/2022)     fluticasone (FLONASE) 50 MCG/ACT nasal spray Place 2 sprays into both nostrils daily. (Patient not taking: Reported on 07/02/2022) 16 g 6   No facility-administered medications prior to visit.     Per HPI unless specifically indicated in ROS section below Review of Systems  Constitutional:  Positive for fatigue.  HENT:  Positive for congestion. Negative for postnasal drip and sore throat.   Respiratory:  Positive for cough.   Neurological:  Negative for headaches.   Objective:  There were no vitals taken for this visit.  Wt Readings from Last 3 Encounters:  07/02/22 171 lb (77.6 kg)  06/11/22 171 lb 8 oz (77.8 kg)  04/16/22 173 lb (78.5 kg)       Physical exam: General: Alert and oriented x 3, no distress, does appear sickly  Pulmonary: Speaks in complete sentences without increased work of breathing, no cough during visit. Sounds congested.  Psychiatric: Normal mood, thought content, and behavior.     Results for orders placed or performed in visit on 04/16/22  Lipid panel  Result Value Ref Range   Cholesterol 221 (H) 0 - 200 mg/dL   Triglycerides 409.8 (H) 0.0 - 149.0 mg/dL   HDL 11.91 >47.82 mg/dL   VLDL 95.6 0.0 - 21.3 mg/dL   LDL Cholesterol 086 (H) 0 - 99 mg/dL   Total CHOL/HDL Ratio 4    NonHDL 170.04   TSH  Result Value Ref Range   TSH 1.07 0.35 - 5.50 uIU/mL  Comprehensive metabolic panel  Result Value Ref Range   Sodium 142 135 - 145 mEq/L   Potassium 3.8 3.5 - 5.1 mEq/L   Chloride 102 96 - 112 mEq/L   CO2 32 19 - 32 mEq/L   Glucose, Bld 94 70 - 99 mg/dL   BUN 16 6 - 23 mg/dL   Creatinine, Ser 5.78 0.40 - 1.20 mg/dL   Total Bilirubin 0.6 0.2 - 1.2 mg/dL   Alkaline Phosphatase 131 (H) 39 - 117 U/L   AST 15 0 - 37 U/L   ALT 11 0 - 35 U/L   Total  Protein 6.6 6.0 - 8.3 g/dL   Albumin 4.2 3.5 - 5.2 g/dL   GFR 46.96 (L) >29.52 mL/min   Calcium 9.3 8.4 - 10.5 mg/dL   Assessment & Plan:   Problem List Items Addressed This Visit       Other   COVID-19 virus infection - Primary    Overall appears stable, mild symptoms thus far.  Rx for Molnupirivir 200 mg, 4 capsules BID. Continue Dayquil and Nyquil.   Discussed updated CDC guidelines for quarantine. Return precautions provided.        Relevant Medications   molnupiravir EUA (LAGEVRIO) 200 mg CAPS capsule  Meds ordered this encounter  Medications   molnupiravir EUA (LAGEVRIO) 200 mg CAPS capsule    Sig: Take 4 capsules (800 mg total) by mouth 2 (two) times daily for 5 days.    Dispense:  40 capsule    Refill:  0    Order Specific Question:   Supervising Provider    Answer:   BEDSOLE, AMY E [[id number]]   No orders of the defined types were placed in this encounter.   I discussed the assessment and treatment plan with the patient. The patient was provided an opportunity to ask questions and all were answered. The patient agreed with the plan and demonstrated an understanding of the instructions. The patient was advised to call back or seek an in-person evaluation if the symptoms worsen or if the condition fails to improve as anticipated.  Follow up plan:  Start molnupirivir for Covid-19 infection as discussed.  Please reach out with further questions if needed.   It was a pleasure to see you today!  Doreene Nest, NP

## 2022-08-07 NOTE — Patient Instructions (Signed)
Start molnupirivir for Covid-19 infection as discussed.  Please reach out with further questions if needed.   It was a pleasure to see you today!

## 2022-08-10 ENCOUNTER — Ambulatory Visit: Payer: Medicare Other

## 2022-08-10 ENCOUNTER — Ambulatory Visit (INDEPENDENT_AMBULATORY_CARE_PROVIDER_SITE_OTHER): Payer: Medicare Other

## 2022-08-10 ENCOUNTER — Ambulatory Visit
Admission: EM | Admit: 2022-08-10 | Discharge: 2022-08-10 | Disposition: A | Discharge status: Home / Self Care | Payer: Medicare Other | Attending: Emergency Medicine | Admitting: Emergency Medicine

## 2022-08-10 DIAGNOSIS — U071 COVID-19: Secondary | ICD-10-CM

## 2022-08-10 DIAGNOSIS — R058 Other specified cough: Secondary | ICD-10-CM | POA: Diagnosis not present

## 2022-08-10 MED ORDER — BENZONATATE 100 MG PO CAPS: 100.0000 mg | ORAL_CAPSULE | Freq: Three times a day (TID) | ORAL | 0 refills | Status: DC | PRN

## 2022-08-10 NOTE — ED Provider Notes (Signed)
Renaldo Fiddler    CSN: [id number] Arrival date & time: 08/10/22  0820      History   Chief Complaint Chief Complaint  Patient presents with   Cough    HPI Christie Wells is a 72 y.o. female.  Patient has COVID.  She presents with 8 day history of congestion and cough.  Her cough is productive of clear mucous.  No fever, chest pain, shortness of breath, or other symptoms.  Treating symptoms with Coricidin.  Patient tested positive for COVID while on a cruise in [place] on 08/04/2022; She had a video visit with her PCP on 08/07/2022 and treated with molnupiravir; she did not take this medication because of the high cost.  Her medical history includes hypertension, atrial fibrillation, hypothyroidism, migraine headaches.  The history is provided by the patient and medical records.    Past Medical History:  Diagnosis Date   Arthritis    generalized   Blood transfusion without reported diagnosis 1953   Cataract 2022   bilateral sx   Chronic headaches    Colitis 1984   COVID-19 12/02/2021   COVID-19 virus infection 10/10/2020   GERD (gastroesophageal reflux disease)    on meds   Hypertension    on meds   Hypothyroidism    on meds   Migraines    Peripheral neuropathy    bilateral feet   Personal history of atrial flutter    had ablation sx 02/2020   TIA (transient ischemic attack)    UTI (urinary tract infection)    Viral URI with cough 01/09/2021    Patient Active Problem List   Diagnosis Date Noted   Irregular heart beats 07/02/2022   Microscopic hematuria 02/17/2022   Pulsatile tinnitus of both ears 07/15/2021   Seasonal allergic rhinitis 01/09/2021   COVID-19 virus infection 10/10/2020   Hemorrhoids 07/09/2020   Hair thinning 05/03/2020   Paroxysmal atrial fibrillation (HCC) 03/04/2020   Anxiety 01/16/2020   Atrial flutter (HCC) 10/18/2019   Tendonitis of right hand 09/07/2019   Hyperlipidemia 06/20/2019   Essential hypertension 04/18/2019   Multiple  nevi 01/11/2019   Preventative health care 12/28/2018   Chronic foot pain 11/04/2018   Palpitations 11/04/2018   Dizziness 11/04/2018   Migraines 10/05/2017   Gastroesophageal reflux disease 10/05/2017   Chronic back pain 07/14/2017   Hypothyroidism 04/21/2017   Vitamin B 12 deficiency 04/21/2017   Vitamin D deficiency 04/21/2017    Past Surgical History:  Procedure Laterality Date   BUNIONECTOMY Bilateral    CARDIAC ELECTROPHYSIOLOGY STUDY AND ABLATION  02/22/2020   CATARACT EXTRACTION, BILATERAL Bilateral 2022   COLONOSCOPY  2015   COLONOSCOPY  2020   KB-MAC-Suprep(exc)-SSA/TA   COLONOSCOPY     2023   FOOT SURGERY Bilateral 2010   5 screws  and again in 2012   POLYPECTOMY  2020   SSA/TA   TUBAL LIGATION     UPPER GI ENDOSCOPY     Gastritis   WISDOM TOOTH EXTRACTION      OB History   No obstetric history on file.      Home Medications    Prior to Admission medications   Medication Sig Start Date End Date Taking? Authorizing Provider  benzonatate (TESSALON) 100 MG capsule Take 1 capsule (100 mg total) by mouth 3 (three) times daily as needed for cough. 08/10/22  Yes Mickie Bail, NP  ACETAMINOPHEN-BUTALBITAL 50-325 MG TABS Take by mouth. [phone]   as needed    [provider]  amLODipine (NORVASC) 2.5 MG tablet TAKE 1 TABLET IN THE MORNING AND AT BEDTIME FOR BLOOD PRESSURE 01/22/22   Doreene Nest, NP  butalbital-acetaminophen-caffeine (FIORICET) [phone] MG tablet Take 1 tablet by mouth every 4 (four) hours as needed. Take 1 tablet at headache onset, can repeat after 4 hours. No more than 2 pills in 24 hours. Do not take more than 2-3 times a week MAXIMUM. Patient not taking: Reported on 08/07/2022 10/07/21   [provider]  CALCIUM-VITAMIN D PO Take 1 tablet by mouth daily at 6 (six) AM.    [provider]  celecoxib (CELEBREX) 100 MG capsule Take 100 mg by mouth 2 (two) times daily as needed.    [provider]   cholecalciferol (VITAMIN D) 1000 units tablet Take 3,000 Units by mouth daily.    [provider]  cyanocobalamin 1000 MCG tablet Take 1,000 mcg by mouth daily.    [provider]  EMGALITY 120 MG/ML SOAJ Inject 1 mL into the skin every 30 (thirty) days. Patient not taking: Reported on 07/02/2022 12/30/21   [provider]  fluticasone (FLONASE) 50 MCG/ACT nasal spray Place 2 sprays into both nostrils daily. Patient not taking: Reported on 07/02/2022 07/15/21   Mort Sawyers, FNP  gabapentin (NEURONTIN) 100 MG capsule Take 100 mg by mouth at bedtime. Patient not taking: Reported on 08/10/2022    [provider]  hydrOXYzine (ATARAX) 10 MG tablet Take 1-2 tablets (10-20 mg total) by mouth 2 (two) times daily as needed for anxiety. 04/17/22   Doreene Nest, NP  lansoprazole (PREVACID) 30 MG capsule TAKE 1 CAPSULE TWICE A DAY BEFORE MEALS FOR HEARTBURN 06/05/22   Doreene Nest, NP  levothyroxine (SYNTHROID) 88 MCG tablet TAKE 1 TAB EVERY MORNING ON AN EMPTY STOMACH WITH WATER ONLY. NO FOOD OR OTHER MEDS FOR 30 MIN 06/21/22   Doreene Nest, NP  nortriptyline (PAMELOR) 10 MG capsule Take 30 mg by mouth daily at 6 (six) AM. 01/01/21   [provider]  rizatriptan (MAXALT) 10 MG tablet Take 10 mg by mouth as needed for migraine. Patient not taking: Reported on 08/10/2022 01/01/21   [provider]  SUMAtriptan (IMITREX) 50 MG tablet 1 TABLET BY MOUTH AT MIGRAINE ONSET MAY REPEAT WITH SECOND TABLET IN 2 HOURS IF MIGRAINE PERSIST Patient not taking: Reported on 08/10/2022 11/27/20   Doreene Nest, NP    Family History Family History  Problem Relation Age of Onset   Alzheimer's disease Mother    Hypertension Mother    Hyperlipidemia Mother    COPD Mother    Aneurysm Mother    Stroke Father    Hypertension Father    Aneurysm Brother    Heart disease Maternal Grandmother    Parkinson's disease Paternal Grandfather    Colon cancer Neg  Hx    Esophageal cancer Neg Hx    Breast cancer Neg Hx    Rectal cancer Neg Hx    Stomach cancer Neg Hx     Social History Social History   Tobacco Use   Smoking status: Never   Smokeless tobacco: Never  Vaping Use   Vaping Use: Never used  Substance Use Topics   Alcohol use: Yes    Alcohol/week: 0.0 - 2.0 standard drinks of alcohol   Drug use: No     Allergies   Codeine, Peanut-containing drug products, Sulfa antibiotics, and Topamax [topiramate]   Review of Systems Review of Systems  Constitutional:  Negative for chills  and fever.  HENT:  Positive for congestion. Negative for ear pain and sore throat.   Respiratory:  Positive for cough. Negative for shortness of breath.   Cardiovascular:  Negative for chest pain and palpitations.  All other systems reviewed and are negative.    Physical Exam Triage Vital Signs ED Triage Vitals  Enc Vitals Group     BP 08/10/22 0849 136/84     Pulse Rate 08/10/22 0834 80     Resp 08/10/22 0834 18     Temp 08/10/22 0834 97.9 F (36.6 C)     Temp src --      SpO2 08/10/22 0834 97 %     Weight 08/10/22 0847 165 lb (74.8 kg)     Height 08/10/22 0847 5' 7.75" (1.721 m)     Head Circumference --      Peak Flow --      Pain Score 08/10/22 0846 0     Pain Loc --      Pain Edu? --      Excl. in GC? --    No data found.  Updated Vital Signs BP 136/84   Pulse 80   Temp 97.9 F (36.6 C)   Resp 18   Ht 5' 7.75" (1.721 m)   Wt 165 lb (74.8 kg)   SpO2 97%   BMI 25.27 kg/m   Visual Acuity Right Eye Distance:   Left Eye Distance:   Bilateral Distance:    Right Eye Near:   Left Eye Near:    Bilateral Near:     Physical Exam Vitals and nursing note reviewed.  Constitutional:      General: She is not in acute distress.    Appearance: Normal appearance. She is well-developed. She is not ill-appearing.  HENT:     Right Ear: Tympanic membrane normal.     Left Ear: Tympanic membrane normal.     Nose: Nose normal.      Mouth/Throat:     Mouth: Mucous membranes are moist.     Pharynx: Oropharynx is clear.  Cardiovascular:     Rate and Rhythm: Normal rate and regular rhythm.     Heart sounds: Normal heart sounds.  Pulmonary:     Effort: Pulmonary effort is normal. No respiratory distress.     Breath sounds: Normal breath sounds. No wheezing, rhonchi or rales.  Musculoskeletal:     Cervical back: Neck supple.  Skin:    General: Skin is warm and dry.  Neurological:     Mental Status: She is alert.  Psychiatric:        Mood and Affect: Mood normal.        Behavior: Behavior normal.      UC Treatments / Results  Labs (all labs ordered are listed, but only abnormal results are displayed) Labs Reviewed - No data to display  EKG   Radiology DG Chest 2 View  Result Date: 08/10/2022 CLINICAL DATA:  71 year old female with history of productive cough. EXAM: CHEST - 2 VIEW COMPARISON:  Chest x-ray 10/15/2020. FINDINGS: Lung volumes are normal. No consolidative airspace disease. No pleural effusions. No pneumothorax. No pulmonary nodule or mass noted. Pulmonary vasculature and the cardiomediastinal silhouette are within normal limits. IMPRESSION: No radiographic evidence of acute cardiopulmonary disease. Electronically Signed   By: Trudie Reed M.D.   On: 08/10/2022 09:25    Procedures Procedures (including critical care time)  Medications Ordered in UC Medications - No data to display  Initial Impression / Assessment  and Plan / UC Course  I have reviewed the triage vital signs and the nursing notes.  Pertinent labs & imaging results that were available during my care of the patient were reviewed by me and considered in my medical decision making (see chart for details).    Productive cough, COVID.  CXR negative.  Lungs are clear and O2 sat 97%.  Treating cough with Tessalon Perles.  Instructed patient to follow up with her PCP if her symptoms are not improving.  She agrees to plan of care.     Final Clinical Impressions(s) / UC Diagnoses   Final diagnoses:  Productive cough  COVID-19     Discharge Instructions      Take the Tessalon Perles as directed.  Follow up with your primary care provider if your symptoms are not improving.        ED Prescriptions     Medication Sig Dispense Auth. Provider   benzonatate (TESSALON) 100 MG capsule Take 1 capsule (100 mg total) by mouth 3 (three) times daily as needed for cough. 21 capsule Mickie Bail, NP      PDMP not reviewed this encounter.   Mickie Bail, NP 08/10/22 [phone]

## 2022-08-10 NOTE — ED Triage Notes (Signed)
Patient to Urgent Care with complaints of chest congestion/ productive cough (clear mucus). Reports at times she is unable to produce anything; reports concerns because the last time she had these symptoms she was almost hospitalized. Denies any known fevers. Has had some night sweats.   Using a humidifier/ Corcidin with expectorant.    Reports testing positive for Covid last Monday. Hx of chronic bronchitis had an inhaler but had difficulty using it properly.

## 2022-08-10 NOTE — Discharge Instructions (Signed)
Take the Tessalon Perles as directed.  Follow up with your primary care provider if your symptoms are not improving.    

## 2022-08-12 ENCOUNTER — Ambulatory Visit (INDEPENDENT_AMBULATORY_CARE_PROVIDER_SITE_OTHER): Payer: Medicare Other | Admitting: Primary Care

## 2022-08-12 ENCOUNTER — Encounter: Payer: Self-pay | Admitting: Primary Care

## 2022-08-12 VITALS — BP 132/86 | HR 77 | Temp 98.4°F | Ht 67.75 in | Wt 167.0 lb

## 2022-08-12 DIAGNOSIS — U071 COVID-19: Secondary | ICD-10-CM

## 2022-08-12 NOTE — Patient Instructions (Addendum)
Start Claritin antihistamine daily.  You may take Tessalon Perles as needed for cough.   Notify me if you develop fevers, increased symptoms, changes to your sputum color.  It was a pleasure to see you today!

## 2022-08-12 NOTE — Assessment & Plan Note (Signed)
Slowly improving and exam today with clear lungs which is reassuring. Reviewed urgent care notes and chest xray which is also reassuring.  Discussed that she may proceed with Tessalon Perles. Also discussed use of antihistamine.   We discussed to notify us if her sputum becomes purulent, she develops fevers, symptoms do not continue to improve.

## 2022-08-12 NOTE — Progress Notes (Addendum)
Subjective:    Patient ID: Christie Wells, female    DOB: Sep 08, 1950, 72 y.o.   MRN: [id number]  HPI  Christie Wells is a very pleasant 72 y.o. female with a history of atrial flutter, atrial fibrillation, allergic rhinitis, GERD, hypothyroidism, COVID-19 infection, hypertension who presents today for urgent care follow-up.  Evaluated virtually by myself on 08/07/2022 for a 3-day history of scratchy throat, rhinorrhea, cough after recent travel.  She tested positive for COVID 19 infection.  Given her medical history she was treated with molnupiravir antiviral treatment.  Symptoms were overall mild.  Later she notified us that the molnupiravir was cost prohibitive so we discussed to monitor symptoms and update if no improvement.  She presented to urgent care on 08/10/2022 for continued cough and congestion with clear mucus.  During this visit she underwent chest x-ray which was negative.  Exam and vital signs were reassuring.  She was treated with Jerilynn Som and instructed follow-up with PCP for no improvement.  Today she is feeling "extremely exhausted" with a continued deep, productive cough. Symptom onset now 9 days ago. She is not taking Lawyer as she didn't want to prevent her mucous from coming up. She feels something "sitting" to the mid chest. Yesterday she began to feel more energy.   Review of Systems  Constitutional:  Positive for fatigue. Negative for fever.  HENT:  Positive for congestion, postnasal drip and rhinorrhea.   Respiratory:  Positive for cough.          Past Medical History:  Diagnosis Date   Arthritis    generalized   Blood transfusion without reported diagnosis 1953   Cataract 2022   bilateral sx   Chronic headaches    Colitis 1984   COVID-19 12/02/2021   COVID-19 virus infection 10/10/2020   GERD (gastroesophageal reflux disease)    on meds   Hypertension    on meds   Hypothyroidism    on meds   Migraines    Peripheral neuropathy     bilateral feet   Personal history of atrial flutter    had ablation sx 02/2020   TIA (transient ischemic attack)    UTI (urinary tract infection)    Viral URI with cough 01/09/2021    Social History   Socioeconomic History   Marital status: Divorced    Spouse name: Not on file   Number of children: 2   Years of education: Not on file   Highest education level: Master's degree (e.g., MA, MS, MEng, MEd, MSW, MBA)  Occupational History   Not on file  Tobacco Use   Smoking status: Never   Smokeless tobacco: Never  Vaping Use   Vaping Use: Never used  Substance and Sexual Activity   Alcohol use: Yes    Alcohol/week: 0.0 - 2.0 standard drinks of alcohol   Drug use: No   Sexual activity: Not on file  Other Topics Concern   Not on file  Social History Narrative   Divorced.   2 children.   Moved from [place].   Once worked as an Runner, broadcasting/film/video.       Social Determinants of Health   Financial Resource Strain: Low Risk  (06/10/2022)   Overall Financial Resource Strain (CARDIA)    Difficulty of Paying Living Expenses: Not hard at all  Food Insecurity: No Food Insecurity (06/10/2022)   Hunger Vital Sign    Worried About Running Out of Food in the Last Year: Never true  Ran Out of Food in the Last Year: Never true  Transportation Needs: No Transportation Needs (06/10/2022)   PRAPARE - Administrator, Civil Service (Medical): No    Lack of Transportation (Non-Medical): No  Physical Activity: Insufficiently Active (06/10/2022)   Exercise Vital Sign    Days of Exercise per Week: 3 days    Minutes of Exercise per Session: 40 min  Stress: No Stress Concern Present (06/10/2022)   [employer] of Occupational Health - Occupational Stress Questionnaire    Feeling of Stress : Not at all  Social Connections: Moderately Integrated (06/10/2022)   Social Connection and Isolation Panel [NHANES]    Frequency of Communication with Friends and Family: More than three  times a week    Frequency of Social Gatherings with Friends and Family: Once a week    Attends Religious Services: 1 to 4 times per year    Active Member of [employer] or Organizations: Yes    Attends Banker Meetings: 1 to 4 times per year    Marital Status: Divorced  Catering manager Violence: Not At Risk (11/13/2021)   Humiliation, Afraid, Rape, and Kick questionnaire    Fear of Current or Ex-Partner: No    Emotionally Abused: No    Physically Abused: No    Sexually Abused: No    Past Surgical History:  Procedure Laterality Date   BUNIONECTOMY Bilateral    CARDIAC ELECTROPHYSIOLOGY STUDY AND ABLATION  02/22/2020   CATARACT EXTRACTION, BILATERAL Bilateral 2022   COLONOSCOPY  2015   COLONOSCOPY  2020   KB-MAC-Suprep(exc)-SSA/TA   COLONOSCOPY     2023   FOOT SURGERY Bilateral 2010   5 screws  and again in 2012   POLYPECTOMY  2020   SSA/TA   TUBAL LIGATION     UPPER GI ENDOSCOPY     Gastritis   WISDOM TOOTH EXTRACTION      Family History  Problem Relation Age of Onset   Alzheimer's disease Mother    Hypertension Mother    Hyperlipidemia Mother    COPD Mother    Aneurysm Mother    Stroke Father    Hypertension Father    Aneurysm Brother    Heart disease Maternal Grandmother    Parkinson's disease Paternal Grandfather    Colon cancer Neg Hx    Esophageal cancer Neg Hx    Breast cancer Neg Hx    Rectal cancer Neg Hx    Stomach cancer Neg Hx     Allergies  Allergen Reactions   Codeine Nausea And Vomiting    Other reaction(s): vomiting    Peanut-Containing Drug Products     Headaches   Sulfa Antibiotics Rash    Other reaction(s): rash   Topamax [Topiramate] Other (See Comments)    Dizzy, numbness/tingling    Current Outpatient Medications on File Prior to Visit  Medication Sig Dispense Refill   ACETAMINOPHEN-BUTALBITAL 50-325 MG TABS Take by mouth. 50-325-40   as needed     amLODipine (NORVASC) 2.5 MG tablet TAKE 1 TABLET IN THE MORNING AND AT  BEDTIME FOR BLOOD PRESSURE 180 tablet 3   benzonatate (TESSALON) 100 MG capsule Take 1 capsule (100 mg total) by mouth 3 (three) times daily as needed for cough. 21 capsule 0   butalbital-acetaminophen-caffeine (FIORICET) 50-325-40 MG tablet Take 1 tablet by mouth every 4 (four) hours as needed. Take 1 tablet at headache onset, can repeat after 4 hours. No more than 2 pills in 24 hours. Do not take  more than 2-3 times a week MAXIMUM.     CALCIUM-VITAMIN D PO Take 1 tablet by mouth daily at 6 (six) AM.     celecoxib (CELEBREX) 100 MG capsule Take 100 mg by mouth 2 (two) times daily as needed.     cholecalciferol (VITAMIN D) 1000 units tablet Take 3,000 Units by mouth daily.     cyanocobalamin 1000 MCG tablet Take 1,000 mcg by mouth daily.     hydrOXYzine (ATARAX) 10 MG tablet Take 1-2 tablets (10-20 mg total) by mouth 2 (two) times daily as needed for anxiety. 90 tablet 0   lansoprazole (PREVACID) 30 MG capsule TAKE 1 CAPSULE TWICE A DAY BEFORE MEALS FOR HEARTBURN 180 capsule 2   levothyroxine (SYNTHROID) 88 MCG tablet TAKE 1 TAB EVERY MORNING ON AN EMPTY STOMACH WITH WATER ONLY. NO FOOD OR OTHER MEDS FOR 30 MIN 90 tablet 2   nortriptyline (PAMELOR) 10 MG capsule Take 30 mg by mouth daily at 6 (six) AM.     rizatriptan (MAXALT) 10 MG tablet Take 10 mg by mouth as needed for migraine.     SUMAtriptan (IMITREX) 50 MG tablet 1 TABLET BY MOUTH AT MIGRAINE ONSET MAY REPEAT WITH SECOND TABLET IN 2 HOURS IF MIGRAINE PERSIST 10 tablet 0   EMGALITY 120 MG/ML SOAJ Inject 1 mL into the skin every 30 (thirty) days. (Patient not taking: Reported on 08/12/2022)     fluticasone (FLONASE) 50 MCG/ACT nasal spray Place 2 sprays into both nostrils daily. (Patient not taking: Reported on 08/12/2022) 16 g 6   gabapentin (NEURONTIN) 100 MG capsule Take 100 mg by mouth at bedtime. (Patient not taking: Reported on 08/10/2022)     No current facility-administered medications on file prior to visit.    BP 132/86   Pulse 77    Temp 98.4 F (36.9 C) (Temporal)   Ht 5' 7.75" (1.721 m)   Wt 167 lb (75.8 kg)   SpO2 98%   BMI 25.58 kg/m  Objective:   Physical Exam HENT:     Right Ear: Tympanic membrane and ear canal normal.     Left Ear: Tympanic membrane and ear canal normal.     Nose:     Right Sinus: No maxillary sinus tenderness or frontal sinus tenderness.     Left Sinus: No maxillary sinus tenderness or frontal sinus tenderness.     Mouth/Throat:     Pharynx: No posterior oropharyngeal erythema.  Eyes:     Conjunctiva/sclera: Conjunctivae normal.  Cardiovascular:     Rate and Rhythm: Normal rate and regular rhythm.  Pulmonary:     Effort: Pulmonary effort is normal.     Breath sounds: Normal breath sounds. No wheezing or rales.     Comments: Bronchospasm type cough noted during exam Musculoskeletal:     Cervical back: Neck supple.  Lymphadenopathy:     Cervical: No cervical adenopathy.  Skin:    General: Skin is warm and dry.           Assessment & Plan:  COVID-19 virus infection Assessment & Plan: Slowly improving and exam today with clear lungs which is reassuring. Reviewed urgent care notes and chest xray which is also reassuring.  Discussed that she may proceed with Tessalon Perles. Also discussed use of antihistamine.   We discussed to notify us if her sputum becomes purulent, she develops fevers, symptoms do not continue to improve.         Doreene Nest, NP

## 2022-09-15 ENCOUNTER — Encounter: Payer: Self-pay | Admitting: Primary Care

## 2022-09-15 ENCOUNTER — Ambulatory Visit (INDEPENDENT_AMBULATORY_CARE_PROVIDER_SITE_OTHER): Payer: Medicare Other | Admitting: Primary Care

## 2022-09-15 VITALS — BP 116/78 | HR 65 | Temp 98.1°F | Ht 67.75 in | Wt 171.0 lb

## 2022-09-15 DIAGNOSIS — R438 Other disturbances of smell and taste: Secondary | ICD-10-CM | POA: Insufficient documentation

## 2022-09-15 DIAGNOSIS — D692 Other nonthrombocytopenic purpura: Secondary | ICD-10-CM

## 2022-09-15 LAB — CBC WITH DIFFERENTIAL/PLATELET
Basophils Absolute: 0.1 10*3/uL (ref 0.0–0.1)
Basophils Relative: 0.9 % (ref 0.0–3.0)
Eosinophils Absolute: 0.2 10*3/uL (ref 0.0–0.7)
Eosinophils Relative: 3.5 % (ref 0.0–5.0)
HCT: 43.3 % (ref 36.0–46.0)
Hemoglobin: 14.6 g/dL (ref 12.0–15.0)
Lymphocytes Relative: 33.8 % (ref 12.0–46.0)
Lymphs Abs: 2.1 10*3/uL (ref 0.7–4.0)
MCHC: 33.8 g/dL (ref 30.0–36.0)
MCV: 92.7 fl (ref 78.0–100.0)
Monocytes Absolute: 0.5 10*3/uL (ref 0.1–1.0)
Monocytes Relative: 8.2 % (ref 3.0–12.0)
Neutro Abs: 3.3 10*3/uL (ref 1.4–7.7)
Neutrophils Relative %: 53.6 % (ref 43.0–77.0)
Platelets: 210 10*3/uL (ref 150.0–400.0)
RBC: 4.67 Mil/uL (ref 3.87–5.11)
RDW: 14.9 % (ref 11.5–15.5)
WBC: 6.1 10*3/uL (ref 4.0–10.5)

## 2022-09-15 LAB — IBC + FERRITIN
Ferritin: 45 ng/mL (ref 10.0–291.0)
Iron: 124 ug/dL (ref 42–145)
Saturation Ratios: 38.3 % (ref 20.0–50.0)
TIBC: 323.4 ug/dL (ref 250.0–450.0)
Transferrin: 231 mg/dL (ref 212.0–360.0)

## 2022-09-15 LAB — VITAMIN B12: Vitamin B-12: 1123 pg/mL — ABNORMAL HIGH (ref 211–911)

## 2022-09-15 LAB — FOLATE: Folate: 16.2 ng/mL (ref >5.9)

## 2022-09-15 NOTE — Patient Instructions (Signed)
Stop by the lab prior to leaving today. I will notify you of your results once received.   It was a pleasure to see you today!  

## 2022-09-15 NOTE — Assessment & Plan Note (Signed)
Unclear etiology. 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  Other differentials include GERD, postnasal drip, upper respiratory. She did have COVID in May 2024, but this started prior to her infection.  Continue lansoprazole 30 mg twice daily for now. Consider addition of famotidine 20 mg daily.  She will also start pain closer attention to her symptoms to see if they correlate with her burning tongue syndrome symptoms.

## 2022-09-15 NOTE — Progress Notes (Signed)
Subjective:    Patient ID: Christie Wells, female    DOB: 1950/06/04, 72 y.o.   MRN: [id number]  HPI  Christie Wells is a very pleasant 72 y.o. female with a history of hypertension, migraines, seasonal allergic rhinitis, GERD, hypothyroidism, paroxysmal atrial fibrillation anxiety who presents today to discuss [facility] concerns.  1) Altered Taste: Symptom onset about 2 months ago with intermittent metallic taste in her mouth for which she mostly notices during the day. History of loss of taste about 1 year ago. She also has a long history of a burning tongue symptoms. Evaluated by ENT 1 year ago for loss of taste, underwent a lot of testing at the time.   She is compliant to lansoprazole 30 mg BID and overall feels controlled. On occasion she will have break through symptoms of reflux with larger meals. She denies post nasal drip, sinus pressure, sinus pain, congestion.   2) Easy Bruising: History of paroxysmal atrial fibrillation, not managed on anticoagulant treatment. Symptom onset of easy bruising to her bilateral upper and lower extremities any times she hits her extremities on anything.   She has been off anticoagulants for > 1 year, she does not take aspirin or NSAIDs. She denies new supplements such as Fish Oil.    Review of Systems  HENT:  Negative for congestion, postnasal drip, sinus pressure and sinus pain.   Respiratory:  Negative for cough and shortness of breath.   Hematological:  Bruises/bleeds easily.         Past Medical History:  Diagnosis Date   Arthritis    generalized   Blood transfusion without reported diagnosis 1953   Cataract 2022   bilateral sx   Chronic headaches    Colitis 1984   COVID-19 12/02/2021   COVID-19 virus infection 10/10/2020   GERD (gastroesophageal reflux disease)    on meds   Hypertension    on meds   Hypothyroidism    on meds   Migraines    Peripheral neuropathy    bilateral feet   Personal history of atrial flutter    had  ablation sx 02/2020   TIA (transient ischemic attack)    UTI (urinary tract infection)    Viral URI with cough 01/09/2021    Social History   Socioeconomic History   Marital status: Divorced    Spouse name: Not on file   Number of children: 2   Years of education: Not on file   Highest education level: Master's degree (e.g., MA, MS, MEng, MEd, MSW, MBA)  Occupational History   Not on file  Tobacco Use   Smoking status: Never   Smokeless tobacco: Never  Vaping Use   Vaping Use: Never used  Substance and Sexual Activity   Alcohol use: Yes    Alcohol/week: 0.0 - 2.0 standard drinks of alcohol   Drug use: No   Sexual activity: Not on file  Other Topics Concern   Not on file  Social History Narrative   Divorced.   2 children.   Moved from [place].   Once worked as an Runner, broadcasting/film/video.       Social Determinants of Health   Financial Resource Strain: Low Risk  (06/10/2022)   Overall Financial Resource Strain (CARDIA)    Difficulty of Paying Living Expenses: Not hard at all  Food Insecurity: No Food Insecurity (06/10/2022)   Hunger Vital Sign    Worried About Running Out of Food in the Last Year: Never true  Ran Out of Food in the Last Year: Never true  Transportation Needs: No Transportation Needs (06/10/2022)   PRAPARE - Administrator, Civil Service (Medical): No    Lack of Transportation (Non-Medical): No  Physical Activity: Insufficiently Active (06/10/2022)   Exercise Vital Sign    Days of Exercise per Week: 3 days    Minutes of Exercise per Session: 40 min  Stress: No Stress Concern Present (06/10/2022)   [employer] of Occupational Health - Occupational Stress Questionnaire    Feeling of Stress : Not at all  Social Connections: Moderately Integrated (06/10/2022)   Social Connection and Isolation Panel [NHANES]    Frequency of Communication with Friends and Family: More than three times a week    Frequency of Social Gatherings with Friends  and Family: Once a week    Attends Religious Services: 1 to 4 times per year    Active Member of [employer] or Organizations: Yes    Attends Banker Meetings: 1 to 4 times per year    Marital Status: Divorced  Catering manager Violence: Not At Risk (11/13/2021)   Humiliation, Afraid, Rape, and Kick questionnaire    Fear of Current or Ex-Partner: No    Emotionally Abused: No    Physically Abused: No    Sexually Abused: No    Past Surgical History:  Procedure Laterality Date   BUNIONECTOMY Bilateral    CARDIAC ELECTROPHYSIOLOGY STUDY AND ABLATION  02/22/2020   CATARACT EXTRACTION, BILATERAL Bilateral 2022   COLONOSCOPY  2015   COLONOSCOPY  2020   KB-MAC-Suprep(exc)-SSA/TA   COLONOSCOPY     2023   FOOT SURGERY Bilateral 2010   5 screws  and again in 2012   POLYPECTOMY  2020   SSA/TA   TUBAL LIGATION     UPPER GI ENDOSCOPY     Gastritis   WISDOM TOOTH EXTRACTION      Family History  Problem Relation Age of Onset   Alzheimer's disease Mother    Hypertension Mother    Hyperlipidemia Mother    COPD Mother    Aneurysm Mother    Stroke Father    Hypertension Father    Aneurysm Brother    Heart disease Maternal Grandmother    Parkinson's disease Paternal Grandfather    Colon cancer Neg Hx    Esophageal cancer Neg Hx    Breast cancer Neg Hx    Rectal cancer Neg Hx    Stomach cancer Neg Hx     Allergies  Allergen Reactions   Codeine Nausea And Vomiting    Other reaction(s): vomiting    Peanut-Containing Drug Products     Headaches   Sulfa Antibiotics Rash    Other reaction(s): rash   Topamax [Topiramate] Other (See Comments)    Dizzy, numbness/tingling    Current Outpatient Medications on File Prior to Visit  Medication Sig Dispense Refill   ACETAMINOPHEN-BUTALBITAL 50-325 MG TABS Take by mouth. 50-325-40   as needed     amLODipine (NORVASC) 2.5 MG tablet TAKE 1 TABLET IN THE MORNING AND AT BEDTIME FOR BLOOD PRESSURE 180 tablet 3    butalbital-acetaminophen-caffeine (FIORICET) 50-325-40 MG tablet Take 1 tablet by mouth every 4 (four) hours as needed. Take 1 tablet at headache onset, can repeat after 4 hours. No more than 2 pills in 24 hours. Do not take more than 2-3 times a week MAXIMUM.     CALCIUM-VITAMIN D PO Take 1 tablet by mouth daily at 6 (six) AM.  cholecalciferol (VITAMIN D) 1000 units tablet Take 3,000 Units by mouth daily.     cyanocobalamin 1000 MCG tablet Take 1,000 mcg by mouth daily.     hydrOXYzine (ATARAX) 10 MG tablet Take 1-2 tablets (10-20 mg total) by mouth 2 (two) times daily as needed for anxiety. 90 tablet 0   lansoprazole (PREVACID) 30 MG capsule TAKE 1 CAPSULE TWICE A DAY BEFORE MEALS FOR HEARTBURN 180 capsule 2   levothyroxine (SYNTHROID) 88 MCG tablet TAKE 1 TAB EVERY MORNING ON AN EMPTY STOMACH WITH WATER ONLY. NO FOOD OR OTHER MEDS FOR 30 MIN 90 tablet 2   nortriptyline (PAMELOR) 10 MG capsule Take 30 mg by mouth daily at 6 (six) AM.     rizatriptan (MAXALT) 10 MG tablet Take 10 mg by mouth as needed for migraine.     SUMAtriptan (IMITREX) 50 MG tablet 1 TABLET BY MOUTH AT MIGRAINE ONSET MAY REPEAT WITH SECOND TABLET IN 2 HOURS IF MIGRAINE PERSIST 10 tablet 0   fluticasone (FLONASE) 50 MCG/ACT nasal spray Place 2 sprays into both nostrils daily. (Patient not taking: Reported on 08/12/2022) 16 g 6   gabapentin (NEURONTIN) 100 MG capsule Take 100 mg by mouth at bedtime. (Patient not taking: Reported on 08/10/2022)     No current facility-administered medications on file prior to visit.    BP 116/78   Pulse 65   Temp 98.1 F (36.7 C) (Temporal)   Ht 5' 7.75" (1.721 m)   Wt 171 lb (77.6 kg)   SpO2 99%   BMI 26.19 kg/m  Objective:   Physical Exam HENT:     Right Ear: Tympanic membrane and ear canal normal.     Left Ear: Tympanic membrane and ear canal normal.     Mouth/Throat:     Mouth: Mucous membranes are moist.     Pharynx: No oropharyngeal exudate or posterior oropharyngeal  erythema.  Cardiovascular:     Rate and Rhythm: Normal rate and regular rhythm.  Pulmonary:     Effort: Pulmonary effort is normal.     Breath sounds: Normal breath sounds.  Musculoskeletal:     Cervical back: Neck supple.  Skin:    General: Skin is warm and dry.     Findings: Bruising present.     Comments: Bruising to bilateral upper extremities            Assessment & Plan:  Senile purpura [facility]) Assessment & Plan: Presentation representative.  Unclear etiology, especially since she is no longer on anticoagulants, fish oil, or antiplatelet therapy.  Checking labs today including CBC, iron studies, B12, folate. Await results.   Orders: -     CBC with Differential/Platelet -     IBC + Ferritin -     Vitamin B12 -     Folate  Metallic taste Assessment & Plan: Unclear etiology. 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  Other differentials include GERD, postnasal drip, upper respiratory. She did have COVID in May 2024, but this started prior to her infection.  Continue lansoprazole 30 mg twice daily for now. Consider addition of famotidine 20 mg daily.  She will also start pain closer attention to her symptoms to see if they correlate with her burning tongue syndrome symptoms.         Doreene Nest, NP

## 2022-09-15 NOTE — Assessment & Plan Note (Signed)
Presentation representative.  Unclear etiology, especially since she is no longer on anticoagulants, fish oil, or antiplatelet therapy.  Checking labs today including CBC, iron studies, B12, folate. Await results.

## 2022-10-22 ENCOUNTER — Encounter: Payer: Self-pay | Admitting: Primary Care

## 2022-10-22 ENCOUNTER — Ambulatory Visit (INDEPENDENT_AMBULATORY_CARE_PROVIDER_SITE_OTHER): Payer: Medicare Other | Admitting: Primary Care

## 2022-10-22 VITALS — BP 126/58 | HR 76 | Temp 97.8°F | Ht 67.75 in | Wt 176.0 lb

## 2022-10-22 DIAGNOSIS — K219 Gastro-esophageal reflux disease without esophagitis: Secondary | ICD-10-CM | POA: Diagnosis not present

## 2022-10-22 DIAGNOSIS — R438 Other disturbances of smell and taste: Secondary | ICD-10-CM | POA: Diagnosis not present

## 2022-10-22 LAB — BASIC METABOLIC PANEL
BUN: 12 mg/dL (ref 6–23)
CO2: 32 mEq/L (ref 19–32)
Calcium: 9.4 mg/dL (ref 8.4–10.5)
Chloride: 103 mEq/L (ref 96–112)
Creatinine, Ser: 0.93 mg/dL (ref 0.40–1.20)
GFR: 62.15 mL/min (ref >60.00)
Glucose, Bld: 92 mg/dL (ref 70–99)
Potassium: 3.7 mEq/L (ref 3.5–5.1)
Sodium: 142 mEq/L (ref 135–145)

## 2022-10-22 NOTE — Progress Notes (Signed)
Subjective:    Patient ID: Christie Wells, female    DOB: 11-13-1950, 72 y.o.   MRN: [id number]  HPI  Christie Wells is a very pleasant 72 y.o. female with a history of migraines, hypertension, paroxysmal atrial fibrillation, GERD, hypothyroidism, pulsatile tinnitus, chronic back pain, hyperlipidemia, neuropathy, anxiety, metallic taste who presents today to discuss several concerns.  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  Evaluated by ENT 1 year ago for loss of taste, underwent extensive testing, no cause for symptoms was found. Her taste returned about 8 months ago.   She was last evaluated for metallic taste to her mouth on 09/15/22 for 45-month history of intermittent metallic taste to her mouth for which she noticed mostly during the day, despite compliance to lansoprazole 30 mg twice daily.  After her last visit a review of her medication side effects was completed which revealed altered taste side effects to nortriptyline and levothyroxine. Nortriptyline has a side effect of unpleasant taste; levothyroxine has a side effect of dysgeusia.   Today she mentions that her symptoms of altered taste are worse. Her metabolic taste is stronger and is occurring everyday. Her burned tongue tasting sensation remains constant. She now wakes during the night with a dry and sore throat, has to drink water.   She's done a lot of research online and believes her symptoms are secondary to a nerve condition as she has neuropathy and pulsatile tinnitus. She wants to make sure nothing else is going on. She also questions if she is deficient in vitamins. She would like to have her zinc levels checked.  She denies sore throat and dry throat during the day. Her GERD symptoms are under control. She is compliant to lansoprazole 30 mg BID. She recently added famotidine 20 mg which did not help.   She has a history of Covid-19  infection on three separate occasions over the years.   Her headaches have been well managed with nortriptyline for which she started in October of 2022. She has a neurologist, plans to set up a follow up visit.    Review of Systems  Constitutional:  Negative for fever.  HENT:  Positive for sore throat and tinnitus.        Altered taste  Respiratory:  Negative for shortness of breath.   Cardiovascular:  Negative for chest pain.  Gastrointestinal:  Negative for abdominal pain.       Denies esophageal burning         Past Medical History:  Diagnosis Date   Arthritis    generalized   Blood transfusion without reported diagnosis 1953   Cataract 2022   bilateral sx   Chronic headaches    Colitis 1984   COVID-19 12/02/2021   COVID-19 virus infection 10/10/2020   GERD (gastroesophageal reflux disease)    on meds   Hypertension    on meds   Hypothyroidism    on meds   Migraines    Peripheral neuropathy    bilateral feet   Personal history of atrial flutter    had ablation sx 02/2020   TIA (transient ischemic attack)    UTI (urinary tract infection)    Viral URI with cough 01/09/2021    Social History   Socioeconomic History   Marital status: Divorced    Spouse name: Not on file   Number of children: 2   Years of education: Not on  file   Highest education level: Master's degree (e.g., MA, MS, MEng, MEd, MSW, MBA)  Occupational History   Not on file  Tobacco Use   Smoking status: Never   Smokeless tobacco: Never  Vaping Use   Vaping status: Never Used  Substance and Sexual Activity   Alcohol use: Yes    Alcohol/week: 0.0 - 2.0 standard drinks of alcohol   Drug use: No   Sexual activity: Not on file  Other Topics Concern   Not on file  Social History Narrative   Divorced.   2 children.   Moved from [place].   Once worked as an Runner, broadcasting/film/video.       Social Determinants of Health   Financial Resource Strain: Low Risk  (06/10/2022)   Overall  Financial Resource Strain (CARDIA)    Difficulty of Paying Living Expenses: Not hard at all  Food Insecurity: No Food Insecurity (06/10/2022)   Hunger Vital Sign    Worried About Running Out of Food in the Last Year: Never true    Ran Out of Food in the Last Year: Never true  Transportation Needs: No Transportation Needs (06/10/2022)   PRAPARE - Administrator, Civil Service (Medical): No    Lack of Transportation (Non-Medical): No  Physical Activity: Insufficiently Active (06/10/2022)   Exercise Vital Sign    Days of Exercise per Week: 3 days    Minutes of Exercise per Session: 40 min  Stress: No Stress Concern Present (06/10/2022)   [employer] of Occupational Health - Occupational Stress Questionnaire    Feeling of Stress : Not at all  Social Connections: Moderately Integrated (06/10/2022)   Social Connection and Isolation Panel [NHANES]    Frequency of Communication with Friends and Family: More than three times a week    Frequency of Social Gatherings with Friends and Family: Once a week    Attends Religious Services: 1 to 4 times per year    Active Member of [employer] or Organizations: Yes    Attends Banker Meetings: 1 to 4 times per year    Marital Status: Divorced  Catering manager Violence: Not At Risk (11/13/2021)   Humiliation, Afraid, Rape, and Kick questionnaire    Fear of Current or Ex-Partner: No    Emotionally Abused: No    Physically Abused: No    Sexually Abused: No    Past Surgical History:  Procedure Laterality Date   BUNIONECTOMY Bilateral    CARDIAC ELECTROPHYSIOLOGY STUDY AND ABLATION  02/22/2020   CATARACT EXTRACTION, BILATERAL Bilateral 2022   COLONOSCOPY  2015   COLONOSCOPY  2020   KB-MAC-Suprep(exc)-SSA/TA   COLONOSCOPY     2023   FOOT SURGERY Bilateral 2010   5 screws  and again in 2012   POLYPECTOMY  2020   SSA/TA   TUBAL LIGATION     UPPER GI ENDOSCOPY     Gastritis   WISDOM TOOTH EXTRACTION      Family History   Problem Relation Age of Onset   Alzheimer's disease Mother    Hypertension Mother    Hyperlipidemia Mother    COPD Mother    Aneurysm Mother    Stroke Father    Hypertension Father    Aneurysm Brother    Heart disease Maternal Grandmother    Parkinson's disease Paternal Grandfather    Colon cancer Neg Hx    Esophageal cancer Neg Hx    Breast cancer Neg Hx    Rectal cancer Neg Hx  Stomach cancer Neg Hx     Allergies  Allergen Reactions   Codeine Nausea And Vomiting    Other reaction(s): vomiting    Peanut-Containing Drug Products     Headaches   Sulfa Antibiotics Rash    Other reaction(s): rash   Topamax [Topiramate] Other (See Comments)    Dizzy, numbness/tingling    Current Outpatient Medications on File Prior to Visit  Medication Sig Dispense Refill   ACETAMINOPHEN-BUTALBITAL 50-325 MG TABS Take by mouth. 50-325-40   as needed     amLODipine (NORVASC) 2.5 MG tablet TAKE 1 TABLET IN THE MORNING AND AT BEDTIME FOR BLOOD PRESSURE 180 tablet 3   butalbital-acetaminophen-caffeine (FIORICET) 50-325-40 MG tablet Take 1 tablet by mouth every 4 (four) hours as needed. Take 1 tablet at headache onset, can repeat after 4 hours. No more than 2 pills in 24 hours. Do not take more than 2-3 times a week MAXIMUM.     CALCIUM-VITAMIN D PO Take 1 tablet by mouth daily at 6 (six) AM.     cholecalciferol (VITAMIN D) 1000 units tablet Take 3,000 Units by mouth daily.     cyanocobalamin 1000 MCG tablet Take 1,000 mcg by mouth daily.     famotidine (PEPCID) 20 MG tablet Take 20 mg by mouth daily.     gabapentin (NEURONTIN) 100 MG capsule Take 100 mg by mouth at bedtime.     hydrOXYzine (ATARAX) 10 MG tablet Take 1-2 tablets (10-20 mg total) by mouth 2 (two) times daily as needed for anxiety. 90 tablet 0   lansoprazole (PREVACID) 30 MG capsule TAKE 1 CAPSULE TWICE A DAY BEFORE MEALS FOR HEARTBURN 180 capsule 2   levothyroxine (SYNTHROID) 88 MCG tablet TAKE 1 TAB EVERY MORNING ON AN EMPTY  STOMACH WITH WATER ONLY. NO FOOD OR OTHER MEDS FOR 30 MIN 90 tablet 2   nortriptyline (PAMELOR) 10 MG capsule Take 30 mg by mouth daily at 6 (six) AM.     rizatriptan (MAXALT) 10 MG tablet Take 10 mg by mouth as needed for migraine.     SUMAtriptan (IMITREX) 50 MG tablet 1 TABLET BY MOUTH AT MIGRAINE ONSET MAY REPEAT WITH SECOND TABLET IN 2 HOURS IF MIGRAINE PERSIST 10 tablet 0   fluticasone (FLONASE) 50 MCG/ACT nasal spray Place 2 sprays into both nostrils daily. (Patient not taking: Reported on 08/12/2022) 16 g 6   No current facility-administered medications on file prior to visit.    BP (!) 126/58   Pulse 76   Temp 97.8 F (36.6 C) (Temporal)   Ht 5' 7.75" (1.721 m)   Wt 176 lb (79.8 kg)   SpO2 98%   BMI 26.96 kg/m  Objective:   Physical Exam HENT:     Mouth/Throat:     Mouth: Mucous membranes are moist.     Tongue: No lesions.     Palate: No mass and lesions.     Pharynx: Oropharynx is clear.  Cardiovascular:     Rate and Rhythm: Normal rate and regular rhythm.  Pulmonary:     Effort: Pulmonary effort is normal.     Breath sounds: Normal breath sounds.  Musculoskeletal:     Cervical back: Neck supple.  Skin:    General: Skin is warm and dry.           Assessment & Plan:  Metallic taste Assessment & Plan: Unclear etiology.  Doesn't seem to be GERD related as she's on PPI and H2 blocker.  Could be secondary to medication side effects  as levothyroxine causes dysgeusia and nortriptyline causes metallic taste, discussed this today. She did start her nortriptyline around the same time that her metallic taste began.  Reviewed labs from prior visits. Adding zinc per patient request.   She will schedule an appointment with ENT and neurology..  Orders: -     Zinc -     Basic metabolic panel  Gastroesophageal reflux disease without esophagitis Assessment & Plan: Controlled.  Do not suspect her symptoms are from GERD. Continue lansoprazole 30 mg  BID.         Doreene Nest, NP

## 2022-10-22 NOTE — Assessment & Plan Note (Signed)
Controlled.  Do not suspect her symptoms are from GERD. Continue lansoprazole 30 mg BID.

## 2022-10-22 NOTE — Assessment & Plan Note (Signed)
Unclear etiology.  Doesn't seem to be GERD related as she's on PPI and H2 blocker.  Could be secondary to medication side effects as levothyroxine causes dysgeusia and nortriptyline causes metallic taste, discussed this today. She did start her nortriptyline around the same time that her metallic taste began.  Reviewed labs from prior visits. Adding zinc per patient request.   She will schedule an appointment with ENT and neurology.Marland Kitchen

## 2022-10-22 NOTE — Patient Instructions (Signed)
Stop by the lab prior to leaving today. I will notify you of your results once received.   Schedule an appointment with ENT and Neurology.  It was a pleasure to see you today!

## 2022-11-02 LAB — ZINC: Zinc: 68 ug/dL (ref 60–130)

## 2022-11-17 ENCOUNTER — Telehealth: Payer: Self-pay | Admitting: Primary Care

## 2022-11-17 NOTE — Telephone Encounter (Signed)
Yes, patient needs a TB skin test.  If she prefers the QuantiFERON blood test then I can order.  Either option is fine.  11 oh.

## 2022-11-17 NOTE — Telephone Encounter (Signed)
Patient dropped off document  physical form  , to be filled out by provider. Patient requested to send it back via Call Patient to pick up within ASAP. Document is located in providers tray at front office.Please advise at Mobile [phone] (mobile) Form indicated pt needs Tb test, patient asked if order could be placed for her to have this done.

## 2022-11-18 NOTE — Telephone Encounter (Signed)
Unable to reach patient. Left voicemail to return call to our office.   

## 2022-11-18 NOTE — Telephone Encounter (Signed)
Patient called back in, spoke with her and advised of Christie Wells message. She wanted to make sure her insurance was going to cover either one. She is going to check and let us know.

## 2022-11-24 ENCOUNTER — Encounter: Payer: Self-pay | Admitting: Family Medicine

## 2022-11-24 ENCOUNTER — Ambulatory Visit (INDEPENDENT_AMBULATORY_CARE_PROVIDER_SITE_OTHER): Payer: Medicare Other | Admitting: Family Medicine

## 2022-11-24 VITALS — BP 124/80 | HR 66 | Temp 97.6°F | Ht 67.75 in | Wt 173.2 lb

## 2022-11-24 DIAGNOSIS — J22 Unspecified acute lower respiratory infection: Secondary | ICD-10-CM | POA: Diagnosis not present

## 2022-11-24 MED ORDER — DOXYCYCLINE HYCLATE 100 MG PO TABS: 100.0000 mg | ORAL_TABLET | Freq: Two times a day (BID) | ORAL | 0 refills | Status: DC

## 2022-11-24 NOTE — Assessment & Plan Note (Signed)
Anticipate acute bronchitis with sinusitis component. Rx doxycycline 10d course. Supportive measures reviewed. She states tessalon perls have not been effective previously.  She declines Rx cough medication.  Update if not improving with treatment.

## 2022-11-24 NOTE — Patient Instructions (Addendum)
You have a bronchitis and possibly sinusitis as well.  Take doxycycline antibiotic 7 day course.  Push fluids and plenty of rest. May continue coricidin as needed.  Please return if you are not improving as expected, or if you have high fevers (>101.5) or difficulty swallowing or worsening productive cough. Call clinic with questions.  Good to see you today. I hope you start feeling better soon.

## 2022-11-24 NOTE — Progress Notes (Addendum)
Ph: [phone] Fax: [phone]   Patient ID: Christie Wells, female    DOB: 1950-05-11, 72 y.o.   MRN: [id number]  This visit was conducted in person.  BP 124/80   Pulse 66   Temp 97.6 F (36.4 C) (Temporal)   Ht 5' 7.75" (1.721 m)   Wt 173 lb 4 oz (78.6 kg)   SpO2 98%   BMI 26.54 kg/m    CC: cough Subjective:   HPI: Christie Wells is a 72 y.o. female presenting on 11/24/2022 for Cough (C/o cough- with yellow mucous and chest congestion. Also, had runny nose and ST. Sxs started about 13 days ago. Neg home Covid tests- x3. )   2 wk h/o productive cough, congestion, rhinorrhea and mild sore throat. Predominant chest = head congestion for the past 2 weeks. Going through multiple boxes of kleenex. Avoids decongestants. Yesterday morning sputum changed color to colored yellow sputum. Worn out from coughing. Rattling cough. Malaise, HA, facial pressure discomfort. She had sinus pressure pain at onset of illness - managed with decongestant.   Had 3 negative COVID tests at home.  Takes coricidin at night with benefit. Overall sleeping well. Uses tylenol for headache.   No fevers/chills, ear or tooth pain, shortness of breath, wheezing.   H/o atrial fibrillation/ flutter s/p ablation. She is now off eliquis. She is followed by [facility].   No h/o asthma.  Non smoker.  No h/o pneumonia.      Relevant past medical, surgical, family and social history reviewed and updated as indicated. Interim medical history since our last visit reviewed. Allergies and medications reviewed and updated. Outpatient Medications Prior to Visit  Medication Sig Dispense Refill   ACETAMINOPHEN-BUTALBITAL 50-325 MG TABS Take by mouth. 50-325-40   as needed     amLODipine (NORVASC) 2.5 MG tablet TAKE 1 TABLET IN THE MORNING AND AT BEDTIME FOR BLOOD PRESSURE 180 tablet 3   butalbital-acetaminophen-caffeine (FIORICET) 50-325-40 MG tablet Take 1 tablet by mouth every 4 (four) hours as needed. Take  1 tablet at headache onset, can repeat after 4 hours. No more than 2 pills in 24 hours. Do not take more than 2-3 times a week MAXIMUM.     CALCIUM-VITAMIN D PO Take 1 tablet by mouth daily at 6 (six) AM.     cholecalciferol (VITAMIN D) 1000 units tablet Take 3,000 Units by mouth daily.     cyanocobalamin 1000 MCG tablet Take 1,000 mcg by mouth daily.     gabapentin (NEURONTIN) 100 MG capsule Take 100 mg by mouth at bedtime.     hydrOXYzine (ATARAX) 10 MG tablet Take 1-2 tablets (10-20 mg total) by mouth 2 (two) times daily as needed for anxiety. 90 tablet 0   lansoprazole (PREVACID) 30 MG capsule TAKE 1 CAPSULE TWICE A DAY BEFORE MEALS FOR HEARTBURN 180 capsule 2   levothyroxine (SYNTHROID) 88 MCG tablet TAKE 1 TAB EVERY MORNING ON AN EMPTY STOMACH WITH WATER ONLY. NO FOOD OR OTHER MEDS FOR 30 MIN 90 tablet 2   nortriptyline (PAMELOR) 10 MG capsule Take 30 mg by mouth daily at 6 (six) AM.     rizatriptan (MAXALT) 10 MG tablet Take 10 mg by mouth as needed for migraine.     SUMAtriptan (IMITREX) 50 MG tablet 1 TABLET BY MOUTH AT MIGRAINE ONSET MAY REPEAT WITH SECOND TABLET IN 2 HOURS IF MIGRAINE PERSIST 10 tablet 0   famotidine (PEPCID) 20 MG tablet Take 20 mg by mouth daily.  No facility-administered medications prior to visit.     Per HPI unless specifically indicated in ROS section below Review of Systems  Objective:  BP 124/80   Pulse 66   Temp 97.6 F (36.4 C) (Temporal)   Ht 5' 7.75" (1.721 m)   Wt 173 lb 4 oz (78.6 kg)   SpO2 98%   BMI 26.54 kg/m   Wt Readings from Last 3 Encounters:  11/24/22 173 lb 4 oz (78.6 kg)  10/22/22 176 lb (79.8 kg)  09/15/22 171 lb (77.6 kg)      Physical Exam Vitals and nursing note reviewed.  Constitutional:      Appearance: Normal appearance. She is not ill-appearing.  HENT:     Head: Normocephalic and atraumatic.     Right Ear: Tympanic membrane, ear canal and external ear normal. There is no impacted cerumen.     Left Ear: Tympanic  membrane, ear canal and external ear normal. There is no impacted cerumen.     Nose:     Comments: Wearing mask    Mouth/Throat:     Mouth: Mucous membranes are moist.     Pharynx: Oropharynx is clear. No oropharyngeal exudate or posterior oropharyngeal erythema.  Eyes:     Extraocular Movements: Extraocular movements intact.     Conjunctiva/sclera: Conjunctivae normal.     Pupils: Pupils are equal, round, and reactive to light.  Cardiovascular:     Rate and Rhythm: Normal rate and regular rhythm.     Pulses: Normal pulses.     Heart sounds: Normal heart sounds. No murmur heard. Pulmonary:     Effort: Pulmonary effort is normal. No respiratory distress.     Breath sounds: Normal breath sounds. No wheezing, rhonchi or rales.     Comments:  Lungs largely clear Rattling deep cough with fits present Lymphadenopathy:     Head:     Right side of head: No submental, submandibular, tonsillar, preauricular or posterior auricular adenopathy.     Left side of head: No submental, submandibular, tonsillar, preauricular or posterior auricular adenopathy.     Cervical: No cervical adenopathy.     Right cervical: No superficial cervical adenopathy.    Left cervical: No superficial cervical adenopathy.     Upper Body:     Right upper body: No supraclavicular adenopathy.     Left upper body: No supraclavicular adenopathy.  Skin:    Findings: No rash.  Neurological:     Mental Status: She is alert.  Psychiatric:        Mood and Affect: Mood normal.        Behavior: Behavior normal.        Assessment & Plan:   Problem List Items Addressed This Visit     Acute respiratory infection - Primary    Anticipate acute bronchitis with sinusitis component. Rx doxycycline 10d course. Supportive measures reviewed. She states tessalon perls have not been effective previously.  She declines Rx cough medication.  Update if not improving with treatment.         Meds ordered this encounter   Medications   DISCONTD: doxycycline (VIBRA-TABS) 100 MG tablet    Sig: Take 1 tablet (100 mg total) by mouth 2 (two) times daily.    Dispense:  14 tablet    Refill:  0   doxycycline (VIBRA-TABS) 100 MG tablet    Sig: Take 1 tablet (100 mg total) by mouth 2 (two) times daily.    Dispense:  20 tablet    Refill:  0    Use this #    No orders of the defined types were placed in this encounter.   Patient Instructions  You have a bronchitis and possibly sinusitis as well.  Take doxycycline antibiotic 7 day course.  Push fluids and plenty of rest. May continue coricidin as needed.  Please return if you are not improving as expected, or if you have high fevers (>101.5) or difficulty swallowing or worsening productive cough. Call clinic with questions.  Good to see you today. I hope you start feeling better soon.   Follow up plan: No follow-ups on file.  Eustaquio Boyden, MD

## 2022-11-26 ENCOUNTER — Telehealth: Payer: Self-pay | Admitting: Primary Care

## 2022-11-26 NOTE — Telephone Encounter (Signed)
Have her take the doxycycline medication with food.  If she continues to experience those symptoms then discontinue the medication and let us know.

## 2022-11-26 NOTE — Telephone Encounter (Signed)
Called patient and reviewed all information. Patient verbalized understanding. Will call if any further questions.  

## 2022-11-26 NOTE — Telephone Encounter (Signed)
Patient should be taking with meals and drinking fluid correct

## 2022-11-26 NOTE — Telephone Encounter (Signed)
Patient called in and stated that she seen Dr. Reece Agar and was prescribed doxycycline (VIBRA-TABS) 100 MG tablet. She stated that it is working but she has experienced some vomiting and diarrhea, but she hasn't been eating anything when she takes it. She was wanting to know if she should take it with food or could something else be sent in for her. Sending to Mayra Reel also as Dr. Reece Agar is out of the office. Please advise. Thank you!

## 2023-01-14 ENCOUNTER — Ambulatory Visit: Payer: Medicare Other

## 2023-01-14 VITALS — Ht 67.0 in | Wt 172.0 lb

## 2023-01-14 DIAGNOSIS — Z1231 Encounter for screening mammogram for malignant neoplasm of breast: Secondary | ICD-10-CM | POA: Diagnosis not present

## 2023-01-14 DIAGNOSIS — Z0000 Encounter for general adult medical examination without abnormal findings: Secondary | ICD-10-CM | POA: Diagnosis not present

## 2023-01-14 NOTE — Patient Instructions (Signed)
Christie Wells , Thank you for taking time to come for your Medicare Wellness Visit. I appreciate your ongoing commitment to your health goals. Please review the following plan we discussed and let me know if I can assist you in the future.   Referrals/Orders/Follow-Ups/Clinician Recommendations: Aim for 30 minutes of exercise or brisk walking, 6-8 glasses of water, and 5 servings of fruits and vegetables each day.   This is a list of the screening recommended for you and due dates:  Health Maintenance  Topic Date Due   Mammogram  04/23/2022   COVID-19 Vaccine (5 - 2023-24 season) 11/15/2022   Flu Shot  06/14/2023*   Medicare Annual Wellness Visit  01/14/2024   DTaP/Tdap/Td vaccine (2 - Td or Tdap) 05/31/2024   Colon Cancer Screening  01/20/2025   Pneumonia Vaccine  Completed   DEXA scan (bone density measurement)  Completed   Hepatitis C Screening  Completed   Zoster (Shingles) Vaccine  Completed   HPV Vaccine  Aged Out  *Topic was postponed. The date shown is not the original due date.    Advanced directives: (Copy Requested) Please bring a copy of your health care power of attorney and living will to the office to be added to your chart at your convenience.  Next Medicare Annual Wellness Visit scheduled for next year: Yes  Insert Preventive Care attachment Insert FALL PREVENTION attachment if needed

## 2023-01-14 NOTE — Progress Notes (Signed)
Subjective:   Christie Wells is a 72 y.o. female who presents for Medicare Annual (Subsequent) preventive examination.  Visit Complete: Virtual I connected with  Fanny Skates Crowson on 01/14/23 by a audio enabled telemedicine application and verified that I am speaking with the correct person using two identifiers.  Patient Location: Home  Provider Location: Home Office  I discussed the limitations of evaluation and management by telemedicine. The patient expressed understanding and agreed to proceed.  Vital Signs: Because this visit was a virtual/telehealth visit, some criteria may be missing or patient reported. Any vitals not documented were not able to be obtained and vitals that have been documented are patient reported.  Patient Medicare AWV questionnaire was completed by the patient on 01/14/2023; I have confirmed that all information answered by patient is correct and no changes since this date.  Cardiac Risk Factors include: advanced age (>17men, >52 women);dyslipidemia     Objective:    Today's Vitals   01/14/23 0959  Weight: 172 lb (78 kg)  Height: 5\' 7"  (1.702 m)   Body mass index is 26.94 kg/m.     01/14/2023   10:03 AM 08/10/2022    8:49 AM 11/13/2021   10:53 AM 10/13/2019    3:37 PM  Advanced Directives  Does Patient Have a Medical Advance Directive? Yes Yes Yes No  Type of Estate agent of [place];Living will Healthcare Power of [place];Living will Healthcare Power of Attorney   Copy of Healthcare Power of Attorney in Chart? No - copy requested  No - copy requested     Current Medications (verified) Outpatient Encounter Medications as of 01/14/2023  Medication Sig   ACETAMINOPHEN-BUTALBITAL 50-325 MG TABS Take by mouth. 50-325-40   as needed   amLODipine (NORVASC) 2.5 MG tablet TAKE 1 TABLET IN THE MORNING AND AT BEDTIME FOR BLOOD PRESSURE   butalbital-acetaminophen-caffeine (FIORICET) 50-325-40 MG tablet Take 1 tablet by mouth every 4  (four) hours as needed. Take 1 tablet at headache onset, can repeat after 4 hours. No more than 2 pills in 24 hours. Do not take more than 2-3 times a week MAXIMUM.   CALCIUM-VITAMIN D PO Take 1 tablet by mouth daily at 6 (six) AM.   cholecalciferol (VITAMIN D) 1000 units tablet Take 3,000 Units by mouth daily.   cyanocobalamin 1000 MCG tablet Take 1,000 mcg by mouth daily.   doxycycline (VIBRA-TABS) 100 MG tablet Take 1 tablet (100 mg total) by mouth 2 (two) times daily.   gabapentin (NEURONTIN) 100 MG capsule Take 100 mg by mouth at bedtime.   hydrOXYzine (ATARAX) 10 MG tablet Take 1-2 tablets (10-20 mg total) by mouth 2 (two) times daily as needed for anxiety.   lansoprazole (PREVACID) 30 MG capsule TAKE 1 CAPSULE TWICE A DAY BEFORE MEALS FOR HEARTBURN   levothyroxine (SYNTHROID) 88 MCG tablet TAKE 1 TAB EVERY MORNING ON AN EMPTY STOMACH WITH WATER ONLY. NO FOOD OR OTHER MEDS FOR 30 MIN   nortriptyline (PAMELOR) 10 MG capsule Take 30 mg by mouth daily at 6 (six) AM.   rizatriptan (MAXALT) 10 MG tablet Take 10 mg by mouth as needed for migraine.   SUMAtriptan (IMITREX) 50 MG tablet 1 TABLET BY MOUTH AT MIGRAINE ONSET MAY REPEAT WITH SECOND TABLET IN 2 HOURS IF MIGRAINE PERSIST   No facility-administered encounter medications on file as of 01/14/2023.    Allergies (verified) Codeine, Peanut-containing drug products, Sulfa antibiotics, and Topamax [topiramate]   History: Past Medical History:  Diagnosis Date  Arthritis    generalized   Blood transfusion without reported diagnosis 1953   Cataract 2022   bilateral sx   Chronic headaches    Colitis 1984   COVID-19 12/02/2021   COVID-19 virus infection 10/10/2020   GERD (gastroesophageal reflux disease)    on meds   Hypertension    on meds   Hypothyroidism    on meds   Migraines    Peripheral neuropathy    bilateral feet   Personal history of atrial flutter    had ablation sx 02/2020   TIA (transient ischemic attack)    UTI  (urinary tract infection)    Viral URI with cough 01/09/2021   Past Surgical History:  Procedure Laterality Date   BUNIONECTOMY Bilateral    CARDIAC ELECTROPHYSIOLOGY STUDY AND ABLATION  02/22/2020   CATARACT EXTRACTION, BILATERAL Bilateral 2022   COLONOSCOPY  2015   COLONOSCOPY  2020   KB-MAC-Suprep(exc)-SSA/TA   COLONOSCOPY     2023   FOOT SURGERY Bilateral 2010   5 screws  and again in 2012   POLYPECTOMY  2020   SSA/TA   TUBAL LIGATION     UPPER GI ENDOSCOPY     Gastritis   WISDOM TOOTH EXTRACTION     Family History  Problem Relation Age of Onset   Alzheimer's disease Mother    Hypertension Mother    Hyperlipidemia Mother    COPD Mother    Aneurysm Mother    Stroke Father    Hypertension Father    Aneurysm Brother    Heart disease Maternal Grandmother    Parkinson's disease Paternal Grandfather    Colon cancer Neg Hx    Esophageal cancer Neg Hx    Breast cancer Neg Hx    Rectal cancer Neg Hx    Stomach cancer Neg Hx    Social History   Socioeconomic History   Marital status: Divorced    Spouse name: Not on file   Number of children: 2   Years of education: Not on file   Highest education level: Master's degree (e.g., MA, MS, MEng, MEd, MSW, MBA)  Occupational History   Not on file  Tobacco Use   Smoking status: Never   Smokeless tobacco: Never  Vaping Use   Vaping status: Never Used  Substance and Sexual Activity   Alcohol use: Yes    Alcohol/week: 0.0 - 2.0 standard drinks of alcohol   Drug use: No   Sexual activity: Not on file  Other Topics Concern   Not on file  Social History Narrative   Divorced.   2 children.   Moved from [place].   Once worked as an Runner, broadcasting/film/video.       Social Determinants of Health   Financial Resource Strain: Low Risk  (01/14/2023)   Overall Financial Resource Strain (CARDIA)    Difficulty of Paying Living Expenses: Not hard at all  Food Insecurity: No Food Insecurity (01/14/2023)   Hunger Vital Sign     Worried About Running Out of Food in the Last Year: Never true    Ran Out of Food in the Last Year: Never true  Transportation Needs: No Transportation Needs (01/14/2023)   PRAPARE - Administrator, Civil Service (Medical): No    Lack of Transportation (Non-Medical): No  Physical Activity: Insufficiently Active (01/14/2023)   Exercise Vital Sign    Days of Exercise per Week: 4 days    Minutes of Exercise per Session: 30 min  Stress: No Stress  Concern Present (01/14/2023)   [employer] of Occupational Health - Occupational Stress Questionnaire    Feeling of Stress : Not at all  Social Connections: Moderately Isolated (01/14/2023)   Social Connection and Isolation Panel [NHANES]    Frequency of Communication with Friends and Family: More than three times a week    Frequency of Social Gatherings with Friends and Family: More than three times a week    Attends Religious Services: Never    Database administrator or Organizations: Yes    Attends Engineer, structural: More than 4 times per year    Marital Status: Divorced    Tobacco Counseling Counseling given: Not Answered   Clinical Intake:  Pre-visit preparation completed: Yes  Pain : No/denies pain     Nutritional Risks: None Diabetes: No  How often do you need to have someone help you when you read instructions, pamphlets, or other written materials from your doctor or pharmacy?: 1 - Never  Interpreter Needed?: No  Information entered by :: Renie Ora, LPN   Activities of Daily Living    01/14/2023   10:03 AM 01/10/2023   11:03 AM  In your present state of health, do you have any difficulty performing the following activities:  Hearing? 0 0  Vision? 0 0  Difficulty concentrating or making decisions? 0 0  Walking or climbing stairs? 0 0  Dressing or bathing? 0 0  Doing errands, shopping? 0 0  Preparing Food and eating ? N N  Using the Toilet? N N  In the past six months, have you  accidently leaked urine? N N  Do you have problems with loss of bowel control? N N  Managing your Medications? N N  Managing your Finances? N N  Housekeeping or managing your Housekeeping? N N    Patient Care Team: Doreene Nest, NP as PCP - General (Internal Medicine) Lanier Prude, MD as PCP - Electrophysiology (Cardiology) Romero Belling, MD as Referring Physician (Physical Medicine and Rehabilitation)  Indicate any recent Medical Services you may have received from other than [facility] providers in the past year (date may be approximate).     Assessment:   This is a routine wellness examination for Aleiyah.  Hearing/Vision screen Vision Screening - Comments:: Wears rx glasses - up to date with routine eye exams with  [facility]    Goals Addressed             This Visit's Progress    DIET - INCREASE WATER INTAKE         Depression Screen    01/14/2023   10:02 AM 11/24/2022   12:08 PM 10/22/2022    9:25 AM 09/15/2022   10:52 AM 04/16/2022   10:13 AM 11/13/2021   10:19 AM 01/23/2021    2:19 PM  PHQ 2/9 Scores  PHQ - 2 Score 0 0 0 0 0 0 0  PHQ- 9 Score       0    Fall Risk    01/14/2023   10:01 AM 01/10/2023   11:03 AM 11/24/2022   12:08 PM 10/22/2022    9:25 AM 09/15/2022   10:52 AM  Fall Risk   Falls in the past year? 0 0 0 0 0  Number falls in past yr: 0   0 0  Injury with Fall? 0 0  0 0  Risk for fall due to : No Fall Risks   No Fall Risks No Fall Risks  Follow  up Falls prevention discussed   Falls evaluation completed Falls evaluation completed    MEDICARE RISK AT HOME: Medicare Risk at Home Any stairs in or around the home?: No If so, are there any without handrails?: No Home free of loose throw rugs in walkways, pet beds, electrical cords, etc?: Yes Adequate lighting in your home to reduce risk of falls?: Yes Life alert?: No Use of a cane, walker or w/c?: No Grab bars in the bathroom?: Yes Shower chair or bench in shower?: Yes Elevated  toilet seat or a handicapped toilet?: Yes  TIMED UP AND GO:  Was the test performed?  No    Cognitive Function:        01/14/2023   10:03 AM 11/13/2021   10:07 AM  6CIT Screen  What Year? 0 points 0 points  What month? 0 points 0 points  What time? 0 points 0 points  Count back from 20 0 points 0 points  Months in reverse 0 points 0 points  Repeat phrase 0 points 0 points  Total Score 0 points 0 points    Immunizations Immunization History  Administered Date(s) Administered   Fluad Quad(high Dose 65+) 12/28/2018, 11/15/2020, 11/18/2021   Influenza, High Dose Seasonal PF 02/01/2017   Influenza,inj,Quad PF,6+ Mos 03/02/2016, 11/25/2017, 01/16/2020   Influenza,inj,quad, With Preservative 03/02/2016   PFIZER Comirnaty(Gray Top)Covid-19 Tri-Sucrose Vaccine 07/02/2020   PFIZER(Purple Top)SARS-COV-2 Vaccination 04/22/2019, 05/16/2019, 12/27/2019   PPD Test 12/08/2017   Pneumococcal Conjugate-13 01/11/2019   Pneumococcal Polysaccharide-23 03/04/2020   Tdap 06/01/2014   Zoster Recombinant(Shingrix) 10/28/2016, 04/23/2017   Zoster, Live 07/18/2012    TDAP status: Up to date  Flu Vaccine status: Up to date  Pneumococcal vaccine status: Up to date  Covid-19 vaccine status: Completed vaccines  Qualifies for Shingles Vaccine? Yes   Zostavax completed Yes   Shingrix Completed?: Yes  Screening Tests Health Maintenance  Topic Date Due   MAMMOGRAM  04/23/2022   COVID-19 Vaccine (5 - 2023-24 season) 11/15/2022   INFLUENZA VACCINE  06/14/2023 (Originally 10/15/2022)   Medicare Annual Wellness (AWV)  01/14/2024   DTaP/Tdap/Td (2 - Td or Tdap) 05/31/2024   Colonoscopy  01/20/2025   Pneumonia Vaccine 65+ Years old  Completed   DEXA SCAN  Completed   Hepatitis C Screening  Completed   Zoster Vaccines- Shingrix  Completed   HPV VACCINES  Aged Out    Health Maintenance  Health Maintenance Due  Topic Date Due   MAMMOGRAM  04/23/2022   COVID-19 Vaccine (5 - 2023-24 season)  11/15/2022    Colorectal cancer screening: Type of screening: Colonoscopy. Completed 01/20/2022. Repeat every 3 years  Mammogram status: Ordered 01/14/2023. Pt provided with contact info and advised to call to schedule appt.   Bone Density status: Completed 01/14/2023. Results reflect: Bone density results: OSTEOPOROSIS. Repeat every 2 years.  Lung Cancer Screening: (Low Dose CT Chest recommended if Age 52-80 years, 20 pack-year currently smoking OR have quit w/in 15years.) does not qualify.   Lung Cancer Screening Referral: n/a  Additional Screening:  Hepatitis C Screening: does not qualify; Completed 09/02/2015  Vision Screening: Recommended annual ophthalmology exams for early detection of glaucoma and other disorders of the eye. Is the patient up to date with their annual eye exam?  Yes  Who is the provider or what is the name of the office in which the patient attends annual eye exams? [facility]  If pt is not established with a provider, would they like to be referred to a provider to  establish care? No .   Dental Screening: Recommended annual dental exams for proper oral hygiene   Community Resource Referral / Chronic Care Management: CRR required this visit?  No   CCM required this visit?  No     Plan:     I have personally reviewed and noted the following in the patient's chart:   Medical and social history Use of alcohol, tobacco or illicit drugs  Current medications and supplements including opioid prescriptions. Patient is not currently taking opioid prescriptions. Functional ability and status Nutritional status Physical activity Advanced directives List of other physicians Hospitalizations, surgeries, and ER visits in previous 12 months Vitals Screenings to include cognitive, depression, and falls Referrals and appointments  In addition, I have reviewed and discussed with patient certain preventive protocols, quality metrics, and best practice  recommendations. A written personalized care plan for preventive services as well as general preventive health recommendations were provided to patient.     Lorrene Reid, LPN   [id number]   After Visit Summary: (MyChart) Due to this being a telephonic visit, the after visit summary with patients personalized plan was offered to patient via MyChart   Nurse Notes: none

## 2023-01-15 ENCOUNTER — Ambulatory Visit (INDEPENDENT_AMBULATORY_CARE_PROVIDER_SITE_OTHER): Payer: Medicare Other | Admitting: Primary Care

## 2023-01-15 ENCOUNTER — Other Ambulatory Visit: Payer: Self-pay | Admitting: Primary Care

## 2023-01-15 VITALS — BP 122/82 | HR 62 | Temp 98.0°F | Ht 67.0 in | Wt 179.0 lb

## 2023-01-15 DIAGNOSIS — Z1231 Encounter for screening mammogram for malignant neoplasm of breast: Secondary | ICD-10-CM

## 2023-01-15 DIAGNOSIS — L918 Other hypertrophic disorders of the skin: Secondary | ICD-10-CM | POA: Diagnosis not present

## 2023-01-15 DIAGNOSIS — F419 Anxiety disorder, unspecified: Secondary | ICD-10-CM | POA: Diagnosis not present

## 2023-01-15 NOTE — Assessment & Plan Note (Signed)
Deteriorated given family stress.  Referral placed for therapy.  Continue hydroxyzine 10-20 mg PRN.

## 2023-01-15 NOTE — Patient Instructions (Signed)
You will either be contacted via phone regarding your referral to therapy, or you may receive a letter on your MyChart portal from our referral team with instructions for scheduling an appointment. Please let us know if you have not been contacted by anyone within two weeks.  Keep the site warm and dry.  It was a pleasure to see you today!

## 2023-01-15 NOTE — Assessment & Plan Note (Signed)
Patient requesting removal. Written consent obtained.   Site cleansed with betadine solution. Pain ease used for analgesia.  Removed skin tag, patient tolerated well.  Silver nitrate sticks used to stop bleeding. Dressing applied.  Home instructions provided.

## 2023-01-15 NOTE — Progress Notes (Signed)
Subjective:    Patient ID: Christie Wells, female    DOB: 02/09/1951, 72 y.o.   MRN: [id number]  HPI  Christie Wells is a very pleasant 72 y.o. female with a history of migraines, paroxysmal atrial fibrillation, atrial flutter, hypothyroidism, hyperlipidemia, multiple nevi who presents today requesting skin tag removal. She would also like to be referred to therapy.  1) Skin Tag: She has a skin tag located to the left upper extremity in the upper humeral region. She would like the skin tag removed as it snags on clothing and gets irritated.   2) Anxiety/Depression: Over the last several months she's been under a lot of stress with family. Both of her daughters are not getting along well. One of her daughters is having a lot of problems since her divorce 3 years ago including alcohol abuse.   Symptoms include difficulty sleeping, feeling anxious, feeling overwhelmed, feeling down, feeling helpless as she wants to help her daughter. She has little motivation to do the things she typically likes to do. She's been taking an extra gabapentin 100 mg and hydroxyzine 10 mg to help her sleep with improvement.   She would like to see a therapist.   Review of Systems  Respiratory:  Negative for shortness of breath.   Cardiovascular:  Negative for chest pain.  Skin:        Skin tag  Psychiatric/Behavioral:  The patient is nervous/anxious.        See HPI         Past Medical History:  Diagnosis Date   Arthritis    generalized   Blood transfusion without reported diagnosis 1953   Cataract 2022   bilateral sx   Chronic headaches    Colitis 1984   COVID-19 12/02/2021   COVID-19 virus infection 10/10/2020   GERD (gastroesophageal reflux disease)    on meds   Hypertension    on meds   Hypothyroidism    on meds   Migraines    Peripheral neuropathy    bilateral feet   Personal history of atrial flutter    had ablation sx 02/2020   TIA (transient ischemic attack)    UTI (urinary  tract infection)    Viral URI with cough 01/09/2021    Social History   Socioeconomic History   Marital status: Divorced    Spouse name: Not on file   Number of children: 2   Years of education: Not on file   Highest education level: Master's degree (e.g., MA, MS, MEng, MEd, MSW, MBA)  Occupational History   Not on file  Tobacco Use   Smoking status: Never   Smokeless tobacco: Never  Vaping Use   Vaping status: Never Used  Substance and Sexual Activity   Alcohol use: Yes    Alcohol/week: 0.0 - 2.0 standard drinks of alcohol   Drug use: No   Sexual activity: Not on file  Other Topics Concern   Not on file  Social History Narrative   Divorced.   2 children.   Moved from [place].   Once worked as an Runner, broadcasting/film/video.       Social Determinants of Health   Financial Resource Strain: Low Risk  (01/14/2023)   Overall Financial Resource Strain (CARDIA)    Difficulty of Paying Living Expenses: Not hard at all  Food Insecurity: No Food Insecurity (01/15/2023)   Hunger Vital Sign    Worried About Running Out of Food in the Last Year: Never true  Ran Out of Food in the Last Year: Never true  Transportation Needs: No Transportation Needs (01/15/2023)   PRAPARE - Administrator, Civil Service (Medical): No    Lack of Transportation (Non-Medical): No  Physical Activity: Sufficiently Active (01/15/2023)   Exercise Vital Sign    Days of Exercise per Week: 3 days    Minutes of Exercise per Session: 60 min  Recent Concern: Physical Activity - Insufficiently Active (01/14/2023)   Exercise Vital Sign    Days of Exercise per Week: 4 days    Minutes of Exercise per Session: 30 min  Stress: Patient Declined (01/15/2023)   [employer] of Occupational Health - Occupational Stress Questionnaire    Feeling of Stress : Patient declined  Social Connections: Moderately Isolated (01/15/2023)   Social Connection and Isolation Panel [NHANES]    Frequency of Communication  with Friends and Family: More than three times a week    Frequency of Social Gatherings with Friends and Family: Twice a week    Attends Religious Services: Never    Database administrator or Organizations: Yes    Attends Engineer, structural: More than 4 times per year    Marital Status: Divorced  Intimate Partner Violence: Not At Risk (01/14/2023)   Humiliation, Afraid, Rape, and Kick questionnaire    Fear of Current or Ex-Partner: No    Emotionally Abused: No    Physically Abused: No    Sexually Abused: No    Past Surgical History:  Procedure Laterality Date   BUNIONECTOMY Bilateral    CARDIAC ELECTROPHYSIOLOGY STUDY AND ABLATION  02/22/2020   CATARACT EXTRACTION, BILATERAL Bilateral 2022   COLONOSCOPY  2015   COLONOSCOPY  2020   KB-MAC-Suprep(exc)-SSA/TA   COLONOSCOPY     2023   FOOT SURGERY Bilateral 2010   5 screws  and again in 2012   POLYPECTOMY  2020   SSA/TA   TUBAL LIGATION     UPPER GI ENDOSCOPY     Gastritis   WISDOM TOOTH EXTRACTION      Family History  Problem Relation Age of Onset   Alzheimer's disease Mother    Hypertension Mother    Hyperlipidemia Mother    COPD Mother    Aneurysm Mother    Stroke Father    Hypertension Father    Aneurysm Brother    Heart disease Maternal Grandmother    Parkinson's disease Paternal Grandfather    Colon cancer Neg Hx    Esophageal cancer Neg Hx    Breast cancer Neg Hx    Rectal cancer Neg Hx    Stomach cancer Neg Hx     Allergies  Allergen Reactions   Codeine Nausea And Vomiting    Other reaction(s): vomiting    Peanut-Containing Drug Products     Headaches   Sulfa Antibiotics Rash    Other reaction(s): rash   Topamax [Topiramate] Other (See Comments)    Dizzy, numbness/tingling    Current Outpatient Medications on File Prior to Visit  Medication Sig Dispense Refill   ACETAMINOPHEN-BUTALBITAL 50-325 MG TABS Take by mouth. 50-325-40   as needed     amLODipine (NORVASC) 2.5 MG tablet TAKE 1  TABLET IN THE MORNING AND AT BEDTIME FOR BLOOD PRESSURE 180 tablet 3   butalbital-acetaminophen-caffeine (FIORICET) 50-325-40 MG tablet Take 1 tablet by mouth every 4 (four) hours as needed. Take 1 tablet at headache onset, can repeat after 4 hours. No more than 2 pills in 24 hours. Do not take  more than 2-3 times a week MAXIMUM.     CALCIUM-VITAMIN D PO Take 1 tablet by mouth daily at 6 (six) AM.     cholecalciferol (VITAMIN D) 1000 units tablet Take 3,000 Units by mouth daily.     cyanocobalamin 1000 MCG tablet Take 1,000 mcg by mouth daily.     gabapentin (NEURONTIN) 100 MG capsule Take 100 mg by mouth at bedtime.     hydrOXYzine (ATARAX) 10 MG tablet Take 1-2 tablets (10-20 mg total) by mouth 2 (two) times daily as needed for anxiety. 90 tablet 0   lansoprazole (PREVACID) 30 MG capsule TAKE 1 CAPSULE TWICE A DAY BEFORE MEALS FOR HEARTBURN 180 capsule 2   levothyroxine (SYNTHROID) 88 MCG tablet TAKE 1 TAB EVERY MORNING ON AN EMPTY STOMACH WITH WATER ONLY. NO FOOD OR OTHER MEDS FOR 30 MIN 90 tablet 2   nortriptyline (PAMELOR) 10 MG capsule Take 30 mg by mouth daily at 6 (six) AM.     rizatriptan (MAXALT) 10 MG tablet Take 10 mg by mouth as needed for migraine.     SUMAtriptan (IMITREX) 50 MG tablet 1 TABLET BY MOUTH AT MIGRAINE ONSET MAY REPEAT WITH SECOND TABLET IN 2 HOURS IF MIGRAINE PERSIST 10 tablet 0   No current facility-administered medications on file prior to visit.    BP 122/82   Pulse 62   Temp 98 F (36.7 C) (Temporal)   Ht 5\' 7"  (1.702 m)   Wt 179 lb (81.2 kg)   SpO2 98%   BMI 28.04 kg/m  Objective:   Physical Exam Cardiovascular:     Rate and Rhythm: Normal rate and regular rhythm.  Pulmonary:     Effort: Pulmonary effort is normal.     Breath sounds: Normal breath sounds.  Musculoskeletal:     Cervical back: Neck supple.  Skin:    General: Skin is warm and dry.     Comments: 0.25 cm flesh colored skin tag to left upper extremity, upper humerus region   Neurological:     Mental Status: She is alert and oriented to person, place, and time.  Psychiatric:        Mood and Affect: Mood normal.     Comments: Tearful during exam           Assessment & Plan:  Anxiety Assessment & Plan: Deteriorated given family stress.  Referral placed for therapy.  Continue hydroxyzine 10-20 mg PRN.  Orders: -     Ambulatory referral to Psychology  Skin tag Assessment & Plan: Patient requesting removal. Written consent obtained.   Site cleansed with betadine solution. Pain ease used for analgesia.  Removed skin tag, patient tolerated well.  Silver nitrate sticks used to stop bleeding. Dressing applied.  Home instructions provided.          Doreene Nest, NP

## 2023-01-18 ENCOUNTER — Other Ambulatory Visit: Payer: Self-pay | Admitting: Primary Care

## 2023-01-18 DIAGNOSIS — I10 Essential (primary) hypertension: Secondary | ICD-10-CM

## 2023-02-04 ENCOUNTER — Ambulatory Visit
Admission: RE | Admit: 2023-02-04 | Discharge: 2023-02-04 | Disposition: A | Discharge status: Home / Self Care | Payer: Medicare Other | Source: Ambulatory Visit | Attending: Primary Care | Admitting: Primary Care

## 2023-02-04 DIAGNOSIS — Z1231 Encounter for screening mammogram for malignant neoplasm of breast: Secondary | ICD-10-CM

## 2023-03-02 ENCOUNTER — Other Ambulatory Visit: Payer: Self-pay | Admitting: Primary Care

## 2023-03-02 DIAGNOSIS — K219 Gastro-esophageal reflux disease without esophagitis: Secondary | ICD-10-CM

## 2023-03-02 NOTE — Telephone Encounter (Signed)
Patient has been scheduled

## 2023-03-02 NOTE — Telephone Encounter (Signed)
Patient is due for CPE/follow up in mid to late February 2025, this will be required prior to any further refills.  Please schedule, thank you!

## 2023-03-03 ENCOUNTER — Ambulatory Visit (INDEPENDENT_AMBULATORY_CARE_PROVIDER_SITE_OTHER): Payer: Medicare Other | Admitting: Family Medicine

## 2023-03-03 ENCOUNTER — Encounter: Payer: Self-pay | Admitting: Family Medicine

## 2023-03-03 VITALS — BP 126/76 | HR 54 | Temp 98.7°F | Ht 67.0 in | Wt 169.6 lb

## 2023-03-03 DIAGNOSIS — R140 Abdominal distension (gaseous): Secondary | ICD-10-CM | POA: Diagnosis not present

## 2023-03-03 NOTE — Patient Instructions (Signed)
Please stop at the lab to have labs drawn.  

## 2023-03-03 NOTE — Assessment & Plan Note (Signed)
Acute, possible recent viral infection changing balance of GI bacteria. Patient with symptoms of irritable bowel syndrome.  No diarrhea at this point but frequent bowel movements associated with bloating. Offered evaluation with lab work and possible stool testing but she would like to hold off at this point  Will treat with probiotic such as align or Culturelle, also encouraged her to continue gradually increasing fiber and water in diet for stool consistency.  She will call if her symptoms or not improving as expected, new fever, recurrence of diarrhea etc. for possible further testing.

## 2023-03-03 NOTE — Progress Notes (Signed)
Patient ID: GENEVRA SABET, female    DOB: 1950-12-29, 72 y.o.   MRN: [id number]  This visit was conducted in person.  BP 126/76   Pulse (!) 54   Temp 98.7 F (37.1 C) (Oral)   Ht 5\' 7"  (1.702 m)   Wt 169 lb 9.6 oz (76.9 kg)   SpO2 96%   BMI 26.56 kg/m    CC:  Chief Complaint  Patient presents with   Stomach Discomfort    About two weeks ago she experienced diarrhea, then 2 days later she was constipated, then she had diarrhea again, the diarrhea stopped and she became bloated. The bloating is painful. Gas  X did help a little.     Subjective:   HPI: TELISSA SERFASS is a 72 y.o. female patient of Mayra Reel, NP with history of GERD, hypothyroidism, hypertension, paroxysmal atrial fibrillation presenting on 03/03/2023 for Stomach Discomfort (About two weeks ago she experienced diarrhea, then 2 days later she was constipated, then she had diarrhea again, the diarrhea stopped and she became bloated. The bloating is painful. Gas  X did help a little. )    She reports alternating diarrhea and constipation associated with bloating and stomach discomfort in last 2 weeks.  No BMs are  more regular , slightly soft, several times a day.  Feels  pain all over.  Even after BM does not feel comfortable or like emptied completely.  Noted increased gurgling.  Tried gas X with no relief.  Prior to symptoms she did see daughter with  diarrhea... treated with antibiotics.   No blood in stool.   She has los 10 lbs given decreased appetite and does not want to eat and be uncomfortable.   Has tried to increase fiber in diet.  01/2022: polyps  Relevant past medical, surgical, family and social history reviewed and updated as indicated. Interim medical history since our last visit reviewed. Allergies and medications reviewed and updated. Outpatient Medications Prior to Visit  Medication Sig Dispense Refill   ACETAMINOPHEN-BUTALBITAL 50-325 MG TABS Take by mouth. 50-325-40   as needed      amLODipine (NORVASC) 2.5 MG tablet TAKE 1 TABLET IN THE MORNING AND AT BEDTIME FOR BLOOD PRESSURE 180 tablet 0   butalbital-acetaminophen-caffeine (FIORICET) 50-325-40 MG tablet Take 1 tablet by mouth every 4 (four) hours as needed. Take 1 tablet at headache onset, can repeat after 4 hours. No more than 2 pills in 24 hours. Do not take more than 2-3 times a week MAXIMUM.     CALCIUM-VITAMIN D PO Take 1 tablet by mouth daily at 6 (six) AM.     cholecalciferol (VITAMIN D) 1000 units tablet Take 3,000 Units by mouth daily.     cyanocobalamin 1000 MCG tablet Take 1,000 mcg by mouth daily.     gabapentin (NEURONTIN) 100 MG capsule Take 100 mg by mouth at bedtime.     hydrOXYzine (ATARAX) 10 MG tablet Take 1-2 tablets (10-20 mg total) by mouth 2 (two) times daily as needed for anxiety. 90 tablet 0   lansoprazole (PREVACID) 30 MG capsule TAKE 1 CAPSULE TWICE A DAY BEFORE MEALS FOR HEARTBURN 180 capsule 0   levothyroxine (SYNTHROID) 88 MCG tablet TAKE 1 TAB EVERY MORNING ON AN EMPTY STOMACH WITH WATER ONLY. NO FOOD OR OTHER MEDS FOR 30 MIN 90 tablet 2   nortriptyline (PAMELOR) 10 MG capsule Take 30 mg by mouth daily at 6 (six) AM.     rizatriptan (MAXALT) 10 MG tablet  Take 10 mg by mouth as needed for migraine.     SUMAtriptan (IMITREX) 50 MG tablet 1 TABLET BY MOUTH AT MIGRAINE ONSET MAY REPEAT WITH SECOND TABLET IN 2 HOURS IF MIGRAINE PERSIST 10 tablet 0   No facility-administered medications prior to visit.     Per HPI unless specifically indicated in ROS section below Review of Systems  Constitutional:  Negative for fatigue and fever.  HENT:  Negative for congestion.   Eyes:  Negative for pain.  Respiratory:  Negative for cough and shortness of breath.   Cardiovascular:  Negative for chest pain, palpitations and leg swelling.  Gastrointestinal:  Negative for abdominal pain.  Genitourinary:  Negative for dysuria and vaginal bleeding.  Musculoskeletal:  Negative for back pain.  Neurological:   Negative for syncope, light-headedness and headaches.  Psychiatric/Behavioral:  Negative for dysphoric mood.    Objective:  BP 126/76   Pulse (!) 54   Temp 98.7 F (37.1 C) (Oral)   Ht 5\' 7"  (1.702 m)   Wt 169 lb 9.6 oz (76.9 kg)   SpO2 96%   BMI 26.56 kg/m   Wt Readings from Last 3 Encounters:  03/03/23 169 lb 9.6 oz (76.9 kg)  01/15/23 179 lb (81.2 kg)  01/14/23 172 lb (78 kg)      Physical Exam Constitutional:      General: She is not in acute distress.    Appearance: Normal appearance. She is well-developed. She is not ill-appearing or toxic-appearing.  HENT:     Head: Normocephalic.     Right Ear: Hearing, tympanic membrane, ear canal and external ear normal. Tympanic membrane is not erythematous, retracted or bulging.     Left Ear: Hearing, tympanic membrane, ear canal and external ear normal. Tympanic membrane is not erythematous, retracted or bulging.     Nose: No mucosal edema or rhinorrhea.     Right Sinus: No maxillary sinus tenderness or frontal sinus tenderness.     Left Sinus: No maxillary sinus tenderness or frontal sinus tenderness.     Mouth/Throat:     Pharynx: Uvula midline.  Eyes:     General: Lids are normal. Lids are everted, no foreign bodies appreciated.     Conjunctiva/sclera: Conjunctivae normal.     Pupils: Pupils are equal, round, and reactive to light.  Neck:     Thyroid: No thyroid mass or thyromegaly.     Vascular: No carotid bruit.     Trachea: Trachea normal.  Cardiovascular:     Rate and Rhythm: Normal rate and regular rhythm.     Pulses: Normal pulses.     Heart sounds: Normal heart sounds, S1 normal and S2 normal. No murmur heard.    No friction rub. No gallop.  Pulmonary:     Effort: Pulmonary effort is normal. No tachypnea or respiratory distress.     Breath sounds: Normal breath sounds. No decreased breath sounds, wheezing, rhonchi or rales.  Abdominal:     General: Bowel sounds are normal.     Palpations: Abdomen is soft.      Tenderness: There is no abdominal tenderness.  Musculoskeletal:     Cervical back: Normal range of motion and neck supple.  Skin:    General: Skin is warm and dry.     Findings: No rash.  Neurological:     Mental Status: She is alert.  Psychiatric:        Mood and Affect: Mood is not anxious or depressed.  Speech: Speech normal.        Behavior: Behavior normal. Behavior is cooperative.        Thought Content: Thought content normal.        Judgment: Judgment normal.       Results for orders placed or performed in visit on 10/22/22  Zinc   Collection Time: 10/22/22  9:59 AM  Result Value Ref Range   Zinc 68 60 - 130 mcg/dL  Basic metabolic panel   Collection Time: 10/22/22  9:59 AM  Result Value Ref Range   Sodium 142 135 - 145 mEq/L   Potassium 3.7 3.5 - 5.1 mEq/L   Chloride 103 96 - 112 mEq/L   CO2 32 19 - 32 mEq/L   Glucose, Bld 92 70 - 99 mg/dL   BUN 12 6 - 23 mg/dL   Creatinine, Ser 1.61 0.40 - 1.20 mg/dL   GFR 09.60 >45.40 mL/min   Calcium 9.4 8.4 - 10.5 mg/dL    Assessment and Plan  Abdominal bloating Assessment & Plan: Acute, possible recent viral infection changing balance of GI bacteria. Patient with symptoms of irritable bowel syndrome.  No diarrhea at this point but frequent bowel movements associated with bloating. Offered evaluation with lab work and possible stool testing but she would like to hold off at this point  Will treat with probiotic such as align or Culturelle, also encouraged her to continue gradually increasing fiber and water in diet for stool consistency.  She will call if her symptoms or not improving as expected, new fever, recurrence of diarrhea etc. for possible further testing.     No follow-ups on file.   Kerby Nora, MD

## 2023-03-04 ENCOUNTER — Encounter: Payer: Self-pay | Admitting: Family Medicine

## 2023-03-20 ENCOUNTER — Other Ambulatory Visit: Payer: Self-pay | Admitting: Primary Care

## 2023-03-20 DIAGNOSIS — E039 Hypothyroidism, unspecified: Secondary | ICD-10-CM

## 2023-03-23 ENCOUNTER — Ambulatory Visit: Payer: Medicare Other | Admitting: Behavioral Health

## 2023-03-23 DIAGNOSIS — F419 Anxiety disorder, unspecified: Secondary | ICD-10-CM | POA: Diagnosis not present

## 2023-03-23 NOTE — Progress Notes (Signed)
   Deneise Lever, LMFT

## 2023-03-23 NOTE — Progress Notes (Signed)
 [facility] Counselor Initial Adult Exam  Name: Christie Wells Date: 03/23/2023 MRN: [id number] DOB: 02-21-1951 PCP: Gretta Comer POUR, NP  Time spent: 60 min In Person @ [facility] Time In: 10:00am Time Out: 11:00am  Guardian/Payee:  [employer] Medicare    Paperwork requested: No   Reason for Visit /Presenting Problem: Referral is for anx/dep & inc'd Family stressors w/both Dtrs; one who is local in [place] & the other who lives in [place]  Mental Status Exam: Appearance:   Casual     Behavior:  Appropriate, Sharing, and Motivated  Motor:  Normal  Speech/Language:   Clear and Coherent and Normal Rate  Affect:  Congruent  Mood:  anxious  Thought process:  normal  Thought content:    WNL  Sensory/Perceptual disturbances:    WNL  Orientation:  oriented to person, place, time/date, and situation  Attention:  Good  Concentration:  Good  Memory:  WNL  Fund of knowledge:   Good  Insight:    Good  Judgment:   Good  Impulse Control:  Good    Risk Assessment: Danger to Self:  No Self-injurious Behavior: No Danger to Others: No Duty to Warn:no Physical Aggression / Violence:No  Access to Firearms a concern: No  Gang Involvement: No Patient / guardian was educated about steps to take if suicide or homicide risk level increases between visits: yes; appropriate to ICD process While future psychiatric events cannot be accurately predicted, the patient does not currently require acute inpatient psychiatric care and does not currently meet [place]  involuntary commitment criteria.  Substance Abuse History: Current substance abuse: No     Past Psychiatric History:   No previous psychological problems have been observed Outpatient Providers: Comer Gretta, NP History of Psych Hospitalization: No  Psychological Testing:  NA    Abuse History:  Victim of: No.,  NA    Report needed: No. Victim of Neglect:No. Perpetrator of  NA   Witness / Exposure to Domestic  Violence: No   Protective Services Involvement: No  Witness to [employer] Violence:  No   Family History:  Family History  Problem Relation Age of Onset   Alzheimer's disease Mother    Hypertension Mother    Hyperlipidemia Mother    COPD Mother    Aneurysm Mother    Stroke Father    Hypertension Father    Aneurysm Brother    Heart disease Maternal Grandmother    Parkinson's disease Paternal Grandfather    Colon cancer Neg Hx    Esophageal cancer Neg Hx    Breast cancer Neg Hx    Rectal cancer Neg Hx    Stomach cancer Neg Hx     Living situation: the patient lives alone  Sexual Orientation: Straight  Relationship Status: divorced since 29 Name of spouse / other: No Sig Other @ this time If a parent, number of children / ages:37yo Darice in [place] who is introverted & is impacted by [employer] & 72yo Allean who lives in [place] & works in [employer] Dept as Dir of Advising.   Support Systems: friends Adult Children Extended Family  Financial Stress:  No   Income/Employment/Disability: Neurosurgeon: No   Educational History: Education: post engineer, maintenance (it) work or degree  Religion/Sprituality/World View: Pt prays & is faithful to herself & Family  Any cultural differences that may affect / interfere with treatment:  None noted today  Recreation/Hobbies: Golfing, swimming, & friends  Stressors:  Marital or family conflict  ; her Dtrs have put her through some tough times last Sept/Oct 2024. The have said some hurtful things & want her to go to Therapy for her worry & anxiety.   Strengths: Supportive Relationships, Family, Self Advocate, and Able to Communicate Effectively  Barriers:  None noted today   Legal History: Pending legal issue / charges: The patient has no significant history of legal issues. History of legal issue / charges:  NA  Medical History/Surgical History: reviewed Past Medical History:  Diagnosis  Date   Arthritis    generalized   Blood transfusion without reported diagnosis 1953   Cataract 2022   bilateral sx   Chronic headaches    Colitis 1984   COVID-19 12/02/2021   COVID-19 virus infection 10/10/2020   GERD (gastroesophageal reflux disease)    on meds   Hypertension    on meds   Hypothyroidism    on meds   Migraines    Peripheral neuropathy    bilateral feet   Personal history of atrial flutter    had ablation sx 02/2020   TIA (transient ischemic attack)    UTI (urinary tract infection)    Viral URI with cough 01/09/2021    Past Surgical History:  Procedure Laterality Date   BUNIONECTOMY Bilateral    CARDIAC ELECTROPHYSIOLOGY STUDY AND ABLATION  02/22/2020   CATARACT EXTRACTION, BILATERAL Bilateral 2022   COLONOSCOPY  2015   COLONOSCOPY  2020   KB-MAC-Suprep(exc)-SSA/TA   COLONOSCOPY     2023   FOOT SURGERY Bilateral 2010   5 screws  and again in 2012   POLYPECTOMY  2020   SSA/TA   TUBAL LIGATION     UPPER GI ENDOSCOPY     Gastritis   WISDOM TOOTH EXTRACTION      Medications: Current Outpatient Medications  Medication Sig Dispense Refill   ACETAMINOPHEN-BUTALBITAL 50-325 MG TABS Take by mouth. 50-325-40   as needed     amLODipine  (NORVASC ) 2.5 MG tablet TAKE 1 TABLET IN THE MORNING AND AT BEDTIME FOR BLOOD PRESSURE 180 tablet 0   butalbital-acetaminophen-caffeine (FIORICET) 50-325-40 MG tablet Take 1 tablet by mouth every 4 (four) hours as needed. Take 1 tablet at headache onset, can repeat after 4 hours. No more than 2 pills in 24 hours. Do not take more than 2-3 times a week MAXIMUM.     CALCIUM-VITAMIN D  PO Take 1 tablet by mouth daily at 6 (six) AM.     cholecalciferol (VITAMIN D ) 1000 units tablet Take 3,000 Units by mouth daily.     cyanocobalamin  1000 MCG tablet Take 1,000 mcg by mouth daily.     gabapentin  (NEURONTIN ) 100 MG capsule Take 100 mg by mouth at bedtime.     hydrOXYzine  (ATARAX ) 10 MG tablet Take 1-2 tablets (10-20 mg total) by  mouth 2 (two) times daily as needed for anxiety. 90 tablet 0   lansoprazole  (PREVACID ) 30 MG capsule TAKE 1 CAPSULE TWICE A DAY BEFORE MEALS FOR HEARTBURN 180 capsule 0   levothyroxine  (SYNTHROID ) 88 MCG tablet TAKE 1 TAB BY MOUTH EVERY MORNING ON EMPTY STOMACH WITH WATER ONLY. NO FOOD OR OTHER MEDS FOR 30 MIN 90 tablet 0   nortriptyline (PAMELOR) 10 MG capsule Take 30 mg by mouth daily at 6 (six) AM.     rizatriptan (MAXALT) 10 MG tablet Take 10 mg by mouth as needed for migraine.     SUMAtriptan  (IMITREX ) 50 MG tablet 1 TABLET BY MOUTH AT MIGRAINE ONSET MAY  REPEAT WITH SECOND TABLET IN 2 HOURS IF MIGRAINE PERSIST 10 tablet 0   No current facility-administered medications for this visit.    Allergies  Allergen Reactions   Codeine Nausea And Vomiting    Other reaction(s): vomiting    Peanut-Containing Drug Products     Headaches   Sulfa Antibiotics Rash    Other reaction(s): rash   Topamax  [Topiramate ] Other (See Comments)    Dizzy, numbness/tingling    Diagnoses:  Anxiety  Plan of Care: Eliane will attend all sessions as scheduled every 2-3 wks. She will keep a Notebook btwn visits to record her happiness & successful moments for our next visit. [place] will explore her purpose in life post Retirement & things that bring her meaning.   Target Date: 04/15/2023  Progress: 4  Frequency: Once every 2-3 wks  Modality: Kennis Richerd LITTIE Hollace, LMFT

## 2023-04-13 ENCOUNTER — Ambulatory Visit: Payer: Medicare Other | Admitting: Behavioral Health

## 2023-04-19 ENCOUNTER — Other Ambulatory Visit: Payer: Self-pay | Admitting: Primary Care

## 2023-04-19 DIAGNOSIS — I10 Essential (primary) hypertension: Secondary | ICD-10-CM

## 2023-04-20 ENCOUNTER — Encounter: Payer: Medicare Other | Admitting: Primary Care

## 2023-04-27 ENCOUNTER — Ambulatory Visit (INDEPENDENT_AMBULATORY_CARE_PROVIDER_SITE_OTHER): Payer: Medicare Other | Admitting: Primary Care

## 2023-04-27 ENCOUNTER — Encounter: Payer: Self-pay | Admitting: Primary Care

## 2023-04-27 VITALS — BP 122/80 | HR 60 | Temp 97.2°F | Ht 67.0 in | Wt 171.0 lb

## 2023-04-27 DIAGNOSIS — Z0000 Encounter for general adult medical examination without abnormal findings: Secondary | ICD-10-CM | POA: Diagnosis not present

## 2023-04-27 DIAGNOSIS — E785 Hyperlipidemia, unspecified: Secondary | ICD-10-CM | POA: Diagnosis not present

## 2023-04-27 DIAGNOSIS — G43109 Migraine with aura, not intractable, without status migrainosus: Secondary | ICD-10-CM | POA: Diagnosis not present

## 2023-04-27 DIAGNOSIS — E039 Hypothyroidism, unspecified: Secondary | ICD-10-CM | POA: Diagnosis not present

## 2023-04-27 DIAGNOSIS — I480 Paroxysmal atrial fibrillation: Secondary | ICD-10-CM | POA: Diagnosis not present

## 2023-04-27 DIAGNOSIS — I10 Essential (primary) hypertension: Secondary | ICD-10-CM | POA: Diagnosis not present

## 2023-04-27 DIAGNOSIS — K219 Gastro-esophageal reflux disease without esophagitis: Secondary | ICD-10-CM

## 2023-04-27 DIAGNOSIS — H93A3 Pulsatile tinnitus, bilateral: Secondary | ICD-10-CM

## 2023-04-27 DIAGNOSIS — F419 Anxiety disorder, unspecified: Secondary | ICD-10-CM

## 2023-04-27 LAB — LIPID PANEL
Cholesterol: 201 mg/dL — ABNORMAL HIGH (ref 0–200)
HDL: 60.1 mg/dL (ref >39.00)
LDL Cholesterol: 115 mg/dL — ABNORMAL HIGH (ref 0–99)
NonHDL: 140.54
Total CHOL/HDL Ratio: 3
Triglycerides: 126 mg/dL (ref 0.0–149.0)
VLDL: 25.2 mg/dL (ref 0.0–40.0)

## 2023-04-27 LAB — COMPREHENSIVE METABOLIC PANEL
ALT: 10 U/L (ref 0–35)
AST: 13 U/L (ref 0–37)
Albumin: 4.1 g/dL (ref 3.5–5.2)
Alkaline Phosphatase: 118 U/L — ABNORMAL HIGH (ref 39–117)
BUN: 15 mg/dL (ref 6–23)
CO2: 32 meq/L (ref 19–32)
Calcium: 9 mg/dL (ref 8.4–10.5)
Chloride: 104 meq/L (ref 96–112)
Creatinine, Ser: 0.79 mg/dL (ref 0.40–1.20)
GFR: 75.32 mL/min (ref >60.00)
Glucose, Bld: 86 mg/dL (ref 70–99)
Potassium: 3.4 meq/L — ABNORMAL LOW (ref 3.5–5.1)
Sodium: 143 meq/L (ref 135–145)
Total Bilirubin: 0.9 mg/dL (ref 0.2–1.2)
Total Protein: 6.3 g/dL (ref 6.0–8.3)

## 2023-04-27 LAB — TSH: TSH: 0.39 u[IU]/mL (ref 0.35–5.50)

## 2023-04-27 NOTE — Patient Instructions (Signed)
Stop by the lab prior to leaving today. I will notify you of your results once received.   It was a pleasure to see you today!

## 2023-04-27 NOTE — Assessment & Plan Note (Addendum)
Stable. Continued.  Reviewed carotid US and MRA brain from 2023. She will contact her insurance company and see if they will pay for an electric  raised bed.

## 2023-04-27 NOTE — Assessment & Plan Note (Signed)
Stable.  Following with neurology, office notes reviewed from August 2024 through [facility]. Continue gabapentin 300 mg HS.

## 2023-04-27 NOTE — Assessment & Plan Note (Signed)
Controlled.  Continue lansoprazole 30 mg BID.

## 2023-04-27 NOTE — Assessment & Plan Note (Signed)
Controlled.  Continue amlodipine 2.5 mg BID.

## 2023-04-27 NOTE — Progress Notes (Signed)
Subjective:    Patient ID: Christie Wells, female    DOB: 08-30-1950, 73 y.o.   MRN: [id number]  HPI  Christie Wells is a very pleasant 73 y.o. female who presents today for complete physical and follow up of chronic conditions.  She is requesting a prescription for a special bed that raises up the head of the bed. Her reasoning for this is due to her chronic pulsatile tinnitus. She experiences these symptoms each time she lays flat at night. She is currently stuffing multiple pillows to keep her head elevated, this is ineffective as the pillows move around.   Immunizations: -Tetanus: Completed in 2016 -Influenza: Completed this season -Shingles: Completed Shingrix series -Pneumonia: Completed Prevnar 13 in 2020, Pneumovax 23 in 2021  Diet: Fair diet.  Exercise: No regular exercise.  Eye exam: Completes annually  Dental exam: Completes semi-annually    Mammogram: Completed in November 2024 Bone Density Scan: Completed in October 2023  Colonoscopy: Completed in 2023, due 2026   BP Readings from Last 3 Encounters:  04/27/23 122/80  03/03/23 126/76  01/15/23 122/82       Review of Systems  Constitutional:  Negative for unexpected weight change.  HENT:  Positive for tinnitus. Negative for rhinorrhea.   Respiratory:  Negative for cough and shortness of breath.   Cardiovascular:  Negative for chest pain.  Gastrointestinal:  Negative for constipation and diarrhea.  Genitourinary:  Negative for difficulty urinating.  Musculoskeletal:  Negative for arthralgias and myalgias.  Skin:  Negative for rash.  Allergic/Immunologic: Negative for environmental allergies.  Neurological:  Negative for dizziness, numbness and headaches.  Psychiatric/Behavioral:  The patient is not nervous/anxious.          Past Medical History:  Diagnosis Date   Arthritis    generalized   Blood transfusion without reported diagnosis 1953   Cataract 2022   bilateral sx   Chronic headaches     Colitis 1984   COVID-19 12/02/2021   COVID-19 virus infection 10/10/2020   GERD (gastroesophageal reflux disease)    on meds   Hypertension    on meds   Hypothyroidism    on meds   Microscopic hematuria 02/17/2022   Migraines    Peripheral neuropathy    bilateral feet   Personal history of atrial flutter    had ablation sx 02/2020   TIA (transient ischemic attack)    UTI (urinary tract infection)    Viral URI with cough 01/09/2021    Social History   Socioeconomic History   Marital status: Divorced    Spouse name: Not on file   Number of children: 2   Years of education: Not on file   Highest education level: Master's degree (e.g., MA, MS, MEng, MEd, MSW, MBA)  Occupational History   Not on file  Tobacco Use   Smoking status: Never   Smokeless tobacco: Never  Vaping Use   Vaping status: Never Used  Substance and Sexual Activity   Alcohol use: Yes    Alcohol/week: 0.0 - 2.0 standard drinks of alcohol   Drug use: No   Sexual activity: Not on file  Other Topics Concern   Not on file  Social History Narrative   Divorced.   2 children.   Moved from [place].   Once worked as an Runner, broadcasting/film/video.       Social Drivers of Health   Financial Resource Strain: Low Risk  (04/23/2023)   Overall Financial Resource Strain (CARDIA)    Difficulty  of Paying Living Expenses: Not very hard  Food Insecurity: No Food Insecurity (04/23/2023)   Hunger Vital Sign    Worried About Running Out of Food in the Last Year: Never true    Ran Out of Food in the Last Year: Never true  Transportation Needs: No Transportation Needs (04/23/2023)   PRAPARE - Administrator, Civil Service (Medical): No    Lack of Transportation (Non-Medical): No  Physical Activity: Insufficiently Active (04/23/2023)   Exercise Vital Sign    Days of Exercise per Week: 2 days    Minutes of Exercise per Session: 30 min  Stress: No Stress Concern Present (04/23/2023)   [employer] of Occupational  Health - Occupational Stress Questionnaire    Feeling of Stress : Not at all  Social Connections: Moderately Isolated (04/23/2023)   Social Connection and Isolation Panel [NHANES]    Frequency of Communication with Friends and Family: More than three times a week    Frequency of Social Gatherings with Friends and Family: More than three times a week    Attends Religious Services: Never    Database administrator or Organizations: Yes    Attends Engineer, structural: More than 4 times per year    Marital Status: Divorced  Intimate Partner Violence: Not At Risk (01/14/2023)   Humiliation, Afraid, Rape, and Kick questionnaire    Fear of Current or Ex-Partner: No    Emotionally Abused: No    Physically Abused: No    Sexually Abused: No    Past Surgical History:  Procedure Laterality Date   BUNIONECTOMY Bilateral    CARDIAC ELECTROPHYSIOLOGY STUDY AND ABLATION  02/22/2020   CATARACT EXTRACTION, BILATERAL Bilateral 2022   COLONOSCOPY  2015   COLONOSCOPY  2020   KB-MAC-Suprep(exc)-SSA/TA   COLONOSCOPY     2023   FOOT SURGERY Bilateral 2010   5 screws  and again in 2012   POLYPECTOMY  2020   SSA/TA   TUBAL LIGATION     UPPER GI ENDOSCOPY     Gastritis   WISDOM TOOTH EXTRACTION      Family History  Problem Relation Age of Onset   Alzheimer's disease Mother    Hypertension Mother    Hyperlipidemia Mother    COPD Mother    Aneurysm Mother    Stroke Father    Hypertension Father    Aneurysm Brother    Heart disease Maternal Grandmother    Parkinson's disease Paternal Grandfather    Colon cancer Neg Hx    Esophageal cancer Neg Hx    Breast cancer Neg Hx    Rectal cancer Neg Hx    Stomach cancer Neg Hx     Allergies  Allergen Reactions   Codeine Nausea And Vomiting    Other reaction(s): vomiting    Peanut-Containing Drug Products     Headaches   Sulfa Antibiotics Rash    Other reaction(s): rash   Topamax [Topiramate] Other (See Comments)    Dizzy,  numbness/tingling    Current Outpatient Medications on File Prior to Visit  Medication Sig Dispense Refill   ACETAMINOPHEN-BUTALBITAL 50-325 MG TABS Take by mouth. 50-325-40   as needed     amLODipine (NORVASC) 2.5 MG tablet TAKE 1 TABLET IN THE MORNING AND AT BEDTIME FOR BLOOD PRESSURE 180 tablet 0   CALCIUM-VITAMIN D PO Take 1 tablet by mouth daily at 6 (six) AM.     cholecalciferol (VITAMIN D) 1000 units tablet Take 3,000 Units by mouth daily.  cyanocobalamin 1000 MCG tablet Take 1,000 mcg by mouth daily.     gabapentin (NEURONTIN) 100 MG capsule Take 300 mg by mouth at bedtime.     hydrOXYzine (ATARAX) 10 MG tablet Take 1-2 tablets (10-20 mg total) by mouth 2 (two) times daily as needed for anxiety. 90 tablet 0   lansoprazole (PREVACID) 30 MG capsule TAKE 1 CAPSULE TWICE A DAY BEFORE MEALS FOR HEARTBURN 180 capsule 0   levothyroxine (SYNTHROID) 88 MCG tablet TAKE 1 TAB BY MOUTH EVERY MORNING ON EMPTY STOMACH WITH WATER ONLY. NO FOOD OR OTHER MEDS FOR 30 MIN 90 tablet 0   No current facility-administered medications on file prior to visit.    BP 122/80   Pulse 60   Temp (!) 97.2 F (36.2 C) (Temporal)   Ht 5\' 7"  (1.702 m)   Wt 171 lb (77.6 kg)   SpO2 99%   BMI 26.78 kg/m  Objective:   Physical Exam HENT:     Right Ear: Tympanic membrane and ear canal normal.     Left Ear: Tympanic membrane and ear canal normal.  Eyes:     Pupils: Pupils are equal, round, and reactive to light.  Cardiovascular:     Rate and Rhythm: Normal rate and regular rhythm.  Pulmonary:     Effort: Pulmonary effort is normal.     Breath sounds: Normal breath sounds.  Abdominal:     General: Bowel sounds are normal.     Palpations: Abdomen is soft.     Tenderness: There is no abdominal tenderness.  Musculoskeletal:        General: Normal range of motion.     Cervical back: Neck supple.  Skin:    General: Skin is warm and dry.  Neurological:     Mental Status: She is alert and oriented to  person, place, and time.     Cranial Nerves: No cranial nerve deficit.     Deep Tendon Reflexes:     Reflex Scores:      Patellar reflexes are 2+ on the right side and 2+ on the left side. Psychiatric:        Mood and Affect: Mood normal.           Assessment & Plan:  Preventative health care Assessment & Plan: Immunizations UTD. Mammogram and bone density scan up-to-date. Colonoscopy UTD, due 2026  Discussed the importance of a healthy diet and regular exercise in order for weight loss, and to reduce the risk of further co-morbidity.  Exam stable. Labs pending.  Follow up in 1 year for repeat physical.    Essential hypertension Assessment & Plan: Controlled.  Continue amlodipine 2.5 mg BID.   Orders: -     Comprehensive metabolic panel  Migraine with aura and without status migrainosus, not intractable Assessment & Plan: Stable.  Following with neurology, office notes reviewed from August 2024 through [facility]. Continue gabapentin 300 mg HS.    Paroxysmal atrial fibrillation (HCC) Assessment & Plan: Rate and rhythm regular.  Reviewed cardiology office notes from October 2024 through [facility] everywhere.     Gastroesophageal reflux disease without esophagitis Assessment & Plan: Controlled.  Continue lansoprazole 30 mg BID   Hypothyroidism, unspecified type Assessment & Plan: She is taking levothyroxine correctly.  Continue levothyroxine 88 mcg daily. Repeat TSH pending.  Orders: -     TSH  Pulsatile tinnitus of both ears Assessment & Plan: Stable. Continued.  Reviewed carotid US and MRA brain from 2023. She will contact her insurance  company and see if they will pay for an electric  raised bed.   Anxiety Assessment & Plan: Stable.  No concerns today. Continue to monitor.    Hyperlipidemia, unspecified hyperlipidemia type Assessment & Plan: Repeat lipid panel.  Orders: -     Lipid panel        Doreene Nest,  NP

## 2023-04-27 NOTE — Assessment & Plan Note (Signed)
Repeat lipid panel ?

## 2023-04-27 NOTE — Assessment & Plan Note (Signed)
Stable.  No concerns today. Continue to monitor.

## 2023-04-27 NOTE — Assessment & Plan Note (Signed)
Immunizations UTD. Mammogram and bone density scan up-to-date. Colonoscopy UTD, due 2026  Discussed the importance of a healthy diet and regular exercise in order for weight loss, and to reduce the risk of further co-morbidity.  Exam stable. Labs pending.  Follow up in 1 year for repeat physical.

## 2023-04-27 NOTE — Assessment & Plan Note (Signed)
She is taking levothyroxine correctly. Continue levothyroxine 88 mcg daily. Repeat TSH pending.

## 2023-04-27 NOTE — Assessment & Plan Note (Signed)
Rate and rhythm regular.  Reviewed cardiology office notes from October 2024 through [facility] everywhere.

## 2023-04-28 ENCOUNTER — Other Ambulatory Visit: Payer: Self-pay | Admitting: Primary Care

## 2023-04-28 DIAGNOSIS — R748 Abnormal levels of other serum enzymes: Secondary | ICD-10-CM

## 2023-05-27 ENCOUNTER — Other Ambulatory Visit: Payer: Medicare Other

## 2023-05-27 DIAGNOSIS — R748 Abnormal levels of other serum enzymes: Secondary | ICD-10-CM

## 2023-05-27 LAB — HEPATIC FUNCTION PANEL
ALT: 9 U/L (ref 0–35)
AST: 12 U/L (ref 0–37)
Albumin: 4 g/dL (ref 3.5–5.2)
Alkaline Phosphatase: 99 U/L (ref 39–117)
Bilirubin, Direct: 0.1 mg/dL (ref 0.0–0.3)
Total Bilirubin: 0.9 mg/dL (ref 0.2–1.2)
Total Protein: 6.2 g/dL (ref 6.0–8.3)

## 2023-05-31 ENCOUNTER — Other Ambulatory Visit: Payer: Self-pay | Admitting: Primary Care

## 2023-05-31 DIAGNOSIS — K219 Gastro-esophageal reflux disease without esophagitis: Secondary | ICD-10-CM

## 2023-06-18 ENCOUNTER — Other Ambulatory Visit: Payer: Self-pay | Admitting: Primary Care

## 2023-06-18 DIAGNOSIS — E039 Hypothyroidism, unspecified: Secondary | ICD-10-CM

## 2023-07-02 ENCOUNTER — Other Ambulatory Visit: Payer: Self-pay | Admitting: Primary Care

## 2023-07-02 ENCOUNTER — Telehealth: Payer: Self-pay

## 2023-07-02 ENCOUNTER — Other Ambulatory Visit (HOSPITAL_COMMUNITY): Payer: Self-pay

## 2023-07-02 DIAGNOSIS — F419 Anxiety disorder, unspecified: Secondary | ICD-10-CM

## 2023-07-02 NOTE — Telephone Encounter (Signed)
 Pharmacy Patient Advocate Encounter   Received notification from CoverMyMeds that prior authorization for hydrOXYzine  HCl 10MG  tablets is required/requested.   Insurance verification completed.   The patient is insured through [employer] .   Per test claim: PA required; PA submitted to above mentioned insurance via CoverMyMeds Key/confirmation #/EOC (Key: [id number])  Status is pending

## 2023-07-02 NOTE — Telephone Encounter (Unsigned)
 Copied from CRM [id number]. Topic: Clinical - Prescription Issue >> Jul 02, 2023  4:59 PM Chuck Crater wrote: Reason for CRM: Patient received a message stating that her hydrOXYzine  (ATARAX ) 10 MG tablet was sent through [facility]. Patient states that she never had it with [facility]. Patient wants that medication sent to her regular pharmacy which is [facility]. Please send hydrOXYzine  (ATARAX ) 10 MG tablet to [facility]. She states that [facility] needs insurance Authorization now and it shouldn't. Please call patient.She is going out of town on Wednesday.

## 2023-07-05 ENCOUNTER — Other Ambulatory Visit (HOSPITAL_COMMUNITY): Payer: Self-pay

## 2023-07-05 NOTE — Telephone Encounter (Signed)
 Pharmacy Patient Advocate Encounter  Received notification from [facility] that Prior Authorization for hydrOXYzine  HCl 10MG  tablets has been APPROVED from 3.19.25 to 4.18.26. Ran test claim, Copay is $RTS, RX WAS LAST FILLED ON 4.19.25. This test claim was processed through [facility]- copay amounts may vary at other pharmacies due to pharmacy/plan contracts, or as the patient moves through the different stages of their insurance plan.   PA #/Case ID/Reference #:  (Key: [id number])

## 2023-07-19 ENCOUNTER — Other Ambulatory Visit: Payer: Self-pay | Admitting: Primary Care

## 2023-07-19 DIAGNOSIS — I10 Essential (primary) hypertension: Secondary | ICD-10-CM

## 2023-10-07 ENCOUNTER — Encounter: Payer: Self-pay | Admitting: Primary Care

## 2023-10-07 ENCOUNTER — Ambulatory Visit (INDEPENDENT_AMBULATORY_CARE_PROVIDER_SITE_OTHER): Admitting: Primary Care

## 2023-10-07 VITALS — BP 122/80 | HR 86 | Temp 97.2°F | Ht 67.0 in | Wt 171.0 lb

## 2023-10-07 DIAGNOSIS — J069 Acute upper respiratory infection, unspecified: Secondary | ICD-10-CM | POA: Diagnosis not present

## 2023-10-07 NOTE — Patient Instructions (Signed)
 You may take the medication we discussed today.  Please update me if your symptoms progress or do not improve by early next week.  It was a pleasure to see you today!

## 2023-10-07 NOTE — Assessment & Plan Note (Signed)
 Exam and HPI today consistent for viral etiology. Respiratory exam today reassuring.  We discussed conservative treatment such as DayQuil/Coricidin, Robitussin as needed. We also discussed to notify if her symptoms progress or do not improve after a full 7 days.  She will update.  Note provided for travel excuse.

## 2023-10-07 NOTE — Progress Notes (Signed)
 Subjective:    Patient ID: Christie Wells, female    DOB: 05-04-1950, 73 y.o.   MRN: [id number]  Cough Associated symptoms include headaches, rhinorrhea and a sore throat. Pertinent negatives include no chills, fever or shortness of breath.    Christie Wells is a very pleasant 73 y.o. female with history of migraines, hypertension, paroxysmal atrial fibrillation, GERD, hypothyroidism, pulsatile tinnitus of both ears, dizziness who presents today to discuss cough.  Symptom onset four days ago with rhinorrhea. Yesterday she developed a sore throat, fatigue, and cough. She denies fevers, chills, body aches, shortness of breath.   She's been taking Coricidin and Dayquil recently.  Prior to symptom onset she was in [place] traveling with her grandchildren.  She took a home Covid-19 test yesterday which was negative.   She has a trip planned to [place], [place] today with [employer] and she cancelled her flight due to her illness. She has trip insurance but needs a doctors note.     Review of Systems  Constitutional:  Positive for fatigue. Negative for chills and fever.  HENT:  Positive for congestion, rhinorrhea and sore throat.   Respiratory:  Positive for cough. Negative for shortness of breath.   Neurological:  Positive for headaches.         Past Medical History:  Diagnosis Date   Arthritis    generalized   Blood transfusion without reported diagnosis 1953   Cataract 2022   bilateral sx   Chronic headaches    Colitis 1984   COVID-19 12/02/2021   COVID-19 virus infection 10/10/2020   GERD (gastroesophageal reflux disease)    on meds   Hypertension    on meds   Hypothyroidism    on meds   Microscopic hematuria 02/17/2022   Migraines    Peripheral neuropathy    bilateral feet   Personal history of atrial flutter    had ablation sx 02/2020   TIA (transient ischemic attack)    UTI (urinary tract infection)    Viral URI with cough 01/09/2021    Social  History   Socioeconomic History   Marital status: Divorced    Spouse name: Not on file   Number of children: 2   Years of education: Not on file   Highest education level: Master's degree (e.g., MA, MS, MEng, MEd, MSW, MBA)  Occupational History   Not on file  Tobacco Use   Smoking status: Never   Smokeless tobacco: Never  Vaping Use   Vaping status: Never Used  Substance and Sexual Activity   Alcohol use: Yes    Alcohol/week: 0.0 - 2.0 standard drinks of alcohol   Drug use: No   Sexual activity: Not on file  Other Topics Concern   Not on file  Social History Narrative   Divorced.   2 children.   Moved from [place] .   Once worked as an Runner, broadcasting/film/video.       Social Drivers of Corporate investment banker Strain: Low Risk  (04/23/2023)   Overall Financial Resource Strain (CARDIA)    Difficulty of Paying Living Expenses: Not very hard  Food Insecurity: No Food Insecurity (04/23/2023)   Hunger Vital Sign    Worried About Running Out of Food in the Last Year: Never true    Ran Out of Food in the Last Year: Never true  Transportation Needs: No Transportation Needs (04/23/2023)   PRAPARE - Administrator, Civil Service (Medical): No  Lack of Transportation (Non-Medical): No  Physical Activity: Insufficiently Active (04/23/2023)   Exercise Vital Sign    Days of Exercise per Week: 2 days    Minutes of Exercise per Session: 30 min  Stress: No Stress Concern Present (04/23/2023)   [employer] of Occupational Health - Occupational Stress Questionnaire    Feeling of Stress : Not at all  Social Connections: Moderately Isolated (04/23/2023)   Social Connection and Isolation Panel    Frequency of Communication with Friends and Family: More than three times a week    Frequency of Social Gatherings with Friends and Family: More than three times a week    Attends Religious Services: Never    Database administrator or Organizations: Yes    Attends Museum/gallery exhibitions officer: More than 4 times per year    Marital Status: Divorced  Intimate Partner Violence: Not At Risk (01/14/2023)   Humiliation, Afraid, Rape, and Kick questionnaire    Fear of Current or Ex-Partner: No    Emotionally Abused: No    Physically Abused: No    Sexually Abused: No    Past Surgical History:  Procedure Laterality Date   BUNIONECTOMY Bilateral    CARDIAC ELECTROPHYSIOLOGY STUDY AND ABLATION  02/22/2020   CATARACT EXTRACTION, BILATERAL Bilateral 2022   COLONOSCOPY  2015   COLONOSCOPY  2020   KB-MAC-Suprep(exc)-SSA/TA   COLONOSCOPY     2023   FOOT SURGERY Bilateral 2010   5 screws  and again in 2012   POLYPECTOMY  2020   SSA/TA   TUBAL LIGATION     UPPER GI ENDOSCOPY     Gastritis   WISDOM TOOTH EXTRACTION      Family History  Problem Relation Age of Onset   Alzheimer's disease Mother    Hypertension Mother    Hyperlipidemia Mother    COPD Mother    Aneurysm Mother    Stroke Father    Hypertension Father    Aneurysm Brother    Heart disease Maternal Grandmother    Parkinson's disease Paternal Grandfather    Colon cancer Neg Hx    Esophageal cancer Neg Hx    Breast cancer Neg Hx    Rectal cancer Neg Hx    Stomach cancer Neg Hx     Allergies  Allergen Reactions   Codeine Nausea And Vomiting    Other reaction(s): vomiting    Peanut-Containing Drug Products     Headaches   Sulfa Antibiotics Rash    Other reaction(s): rash   Topamax  [Topiramate ] Other (See Comments)    Dizzy, numbness/tingling    Current Outpatient Medications on File Prior to Visit  Medication Sig Dispense Refill   ACETAMINOPHEN-BUTALBITAL 50-325 MG TABS Take by mouth. 50-325-40   as needed     amLODipine  (NORVASC ) 2.5 MG tablet TAKE 1 TABLET IN THE MORNING AND AT BEDTIME FOR BLOOD PRESSURE 180 tablet 2   CALCIUM-VITAMIN D  PO Take 1 tablet by mouth daily at 6 (six) AM.     cholecalciferol (VITAMIN D ) 1000 units tablet Take 3,000 Units by mouth daily.      cyanocobalamin  1000 MCG tablet Take 1,000 mcg by mouth daily.     gabapentin  (NEURONTIN ) 100 MG capsule Take 300 mg by mouth at bedtime.     hydrOXYzine  (ATARAX ) 10 MG tablet TAKE 1-2 TABLETS (10-20 MG TOTAL) BY MOUTH 2 (TWO) TIMES DAILY AS NEEDED FOR ANXIETY. 90 tablet 0   lansoprazole  (PREVACID ) 30 MG capsule TAKE 1 CAPSULE TWICE A DAY  BEFORE MEALS FOR HEARTBURN 180 capsule 2   levothyroxine  (SYNTHROID ) 88 MCG tablet TAKE 1 TAB BY MOUTH EVERY MORNING ON EMPTY STOMACH WITH WATER ONLY. NO FOOD OR OTHER MEDS FOR 30 MIN 90 tablet 2   No current facility-administered medications on file prior to visit.    BP 122/80   Pulse 86   Temp (!) 97.2 F (36.2 C) (Temporal)   Ht 5' 7 (1.702 m)   Wt 171 lb (77.6 kg)   SpO2 98%   BMI 26.78 kg/m  Objective:   Physical Exam Constitutional:      Appearance: She is ill-appearing.  HENT:     Right Ear: Tympanic membrane and ear canal normal.     Left Ear: Tympanic membrane and ear canal normal.     Nose: No mucosal edema.     Right Sinus: No maxillary sinus tenderness or frontal sinus tenderness.     Left Sinus: No maxillary sinus tenderness or frontal sinus tenderness.     Mouth/Throat:     Mouth: Mucous membranes are moist.  Eyes:     Conjunctiva/sclera: Conjunctivae normal.  Cardiovascular:     Rate and Rhythm: Normal rate and regular rhythm.  Pulmonary:     Effort: Pulmonary effort is normal.     Breath sounds: Normal breath sounds. No wheezing or rhonchi.  Musculoskeletal:     Cervical back: Neck supple.  Skin:    General: Skin is warm and dry.           Assessment & Plan:  Viral upper respiratory tract infection Assessment & Plan: Exam and HPI today consistent for viral etiology. Respiratory exam today reassuring.  We discussed conservative treatment such as DayQuil/Coricidin, Robitussin as needed. We also discussed to notify if her symptoms progress or do not improve after a full 7 days.  She will update.  Note provided  for travel excuse.         Tyan Dy K Burle Kwan, NP

## 2023-11-02 ENCOUNTER — Encounter: Payer: Self-pay | Admitting: Primary Care

## 2023-11-02 ENCOUNTER — Telehealth: Admitting: Primary Care

## 2023-11-02 DIAGNOSIS — K219 Gastro-esophageal reflux disease without esophagitis: Secondary | ICD-10-CM | POA: Diagnosis not present

## 2023-11-02 MED ORDER — PANTOPRAZOLE SODIUM 20 MG PO TBEC: 20.0000 mg | DELAYED_RELEASE_TABLET | Freq: Two times a day (BID) | ORAL | 0 refills | Status: DC

## 2023-11-02 NOTE — Progress Notes (Signed)
 Patient ID: Christie Wells, female    DOB: May 23, 1950, 73 y.o.   MRN: [id number]  Virtual visit completed through [employer], a video enabled telemedicine application. Due to national recommendations of social distancing due to COVID-19, a virtual visit is felt to be most appropriate for this patient at this time. Reviewed limitations, risks, security and privacy concerns of performing a virtual visit and the availability of in person appointments. I also reviewed that there may be a patient responsible charge related to this service. The patient agreed to proceed.   Patient location: home Provider location: [place] at [facility], office Persons participating in this virtual visit: patient, provider   If any vitals were documented, they were collected by patient at home unless specified below.    There were no vitals taken for this visit.   CC: Postnasal drip Subjective:   HPI: Christie Wells is a 73 y.o. female with a history of allergic rhinitis, GERD, chronic metallic taste presenting on 11/02/2023 for Acute Visit (Started 3 weeks ago/Constantly has phlegm in throat, having to clear throat a lot, metallic taste in mouth. /)  Symptom onset 3 weeks ago with a constant need to clear her throat, post nasal drip, cough with mucous. About 4 weeks ago she had a cold which resolved.   She is managed on lansoprazole  30 mg BID, but she doesn't believe this is effective due to increase metallic and sour taste in her mouth. She has an appointment scheduled with GI in 2 weeks.   She used ipratropium nasal spray last night with slight improvement, she's also taken Tums without improvement. Her symptoms are most bothersome after eating. She feels a sensation of something sitting in my throat. She can cough up clear sputum. She does not take an antihistamine.       Relevant past medical, surgical, family and social history reviewed and updated as indicated. Interim medical history since our last  visit reviewed. Allergies and medications reviewed and updated. Outpatient Medications Prior to Visit  Medication Sig Dispense Refill   ACETAMINOPHEN-BUTALBITAL 50-325 MG TABS Take by mouth. [id number]   as needed     amLODipine  (NORVASC ) 2.5 MG tablet TAKE 1 TABLET IN THE MORNING AND AT BEDTIME FOR BLOOD PRESSURE 180 tablet 2   CALCIUM-VITAMIN D  PO Take 1 tablet by mouth daily at 6 (six) AM.     cholecalciferol (VITAMIN D ) 1000 units tablet Take 3,000 Units by mouth daily.     cyanocobalamin  1000 MCG tablet Take 1,000 mcg by mouth daily.     gabapentin  (NEURONTIN ) 100 MG capsule Take 300 mg by mouth at bedtime.     hydrOXYzine  (ATARAX ) 10 MG tablet TAKE 1-2 TABLETS (10-20 MG TOTAL) BY MOUTH 2 (TWO) TIMES DAILY AS NEEDED FOR ANXIETY. 90 tablet 0   levothyroxine  (SYNTHROID ) 88 MCG tablet TAKE 1 TAB BY MOUTH EVERY MORNING ON EMPTY STOMACH WITH WATER ONLY. NO FOOD OR OTHER MEDS FOR 30 MIN 90 tablet 2   lansoprazole  (PREVACID ) 30 MG capsule TAKE 1 CAPSULE TWICE A DAY BEFORE MEALS FOR HEARTBURN 180 capsule 2   No facility-administered medications prior to visit.     Per HPI unless specifically indicated in ROS section below Review of Systems  Constitutional:  Negative for fever.  HENT:  Positive for postnasal drip. Negative for congestion and sore throat.        Metallic taste, sour taste   Objective:  There were no vitals taken for this visit.  Wt Readings from  Last 3 Encounters:  10/07/23 171 lb (77.6 kg)  04/27/23 171 lb (77.6 kg)  03/03/23 169 lb 9.6 oz (76.9 kg)       Physical exam: General: Alert and oriented x 3, no distress, does not appear sickly  Pulmonary: Speaks in complete sentences without increased work of breathing, no cough during visit.  Psychiatric: Normal mood, thought content, and behavior.     Results for orders placed or performed in visit on 05/27/23  Hepatic function panel   Collection Time: 05/27/23 10:17 AM  Result Value Ref Range   Total Bilirubin 0.9  0.2 - 1.2 mg/dL   Bilirubin, Direct 0.1 0.0 - 0.3 mg/dL   Alkaline Phosphatase 99 39 - 117 U/L   AST 12 0 - 37 U/L   ALT 9 0 - 35 U/L   Total Protein 6.2 6.0 - 8.3 g/dL   Albumin 4.0 3.5 - 5.2 g/dL   Assessment & Plan:   Problem List Items Addressed This Visit       Digestive   Gastroesophageal reflux disease - Primary   Symptoms suggestive of GERD.  Will stop lansoprazole  30 mg BID, start pantoprazole  20 mg BID.   She will update. Follow up with GI.      Relevant Medications   pantoprazole  (PROTONIX ) 20 MG tablet     Meds ordered this encounter  Medications   pantoprazole  (PROTONIX ) 20 MG tablet    Sig: Take 1 tablet (20 mg total) by mouth 2 (two) times daily before a meal. for heartburn.    Dispense:  180 tablet    Refill:  0    Supervising Provider:   BEDSOLE, AMY E [[id number]]   No orders of the defined types were placed in this encounter.   I discussed the assessment and treatment plan with the patient. The patient was provided an opportunity to ask questions and all were answered. The patient agreed with the plan and demonstrated an understanding of the instructions. The patient was advised to call back or seek an in-person evaluation if the symptoms worsen or if the condition fails to improve as anticipated.  Follow up plan:  Stop taking lansoprazole  for heartburn.  Start pantoprazole  20 mg twice daily for heartburn.  You can try an antihistamine such as Allegra or Claritin if the pantoprazole  is not effective.  Follow-up with GI as scheduled.  Tayona Sarnowski K Fatih Stalvey, NP

## 2023-11-02 NOTE — Patient Instructions (Signed)
  Stop taking lansoprazole  for heartburn.  Start pantoprazole  20 mg twice daily for heartburn.  You can try an antihistamine such as Allegra or Claritin if the pantoprazole  is not effective.  Follow-up with GI as scheduled.

## 2023-11-02 NOTE — Assessment & Plan Note (Signed)
 Symptoms suggestive of GERD.  Will stop lansoprazole  30 mg BID, start pantoprazole  20 mg BID.   She will update. Follow up with GI.

## 2023-11-10 ENCOUNTER — Encounter: Payer: Self-pay | Admitting: Gastroenterology

## 2023-11-10 ENCOUNTER — Ambulatory Visit (INDEPENDENT_AMBULATORY_CARE_PROVIDER_SITE_OTHER): Admitting: Gastroenterology

## 2023-11-10 VITALS — BP 120/76 | HR 60 | Ht 67.25 in | Wt 171.5 lb

## 2023-11-10 DIAGNOSIS — K219 Gastro-esophageal reflux disease without esophagitis: Secondary | ICD-10-CM

## 2023-11-10 DIAGNOSIS — Z860100 Personal history of colon polyps, unspecified: Secondary | ICD-10-CM | POA: Diagnosis not present

## 2023-11-10 DIAGNOSIS — R09A2 Foreign body sensation, throat: Secondary | ICD-10-CM

## 2023-11-10 DIAGNOSIS — R0989 Other specified symptoms and signs involving the circulatory and respiratory systems: Secondary | ICD-10-CM | POA: Diagnosis not present

## 2023-11-10 MED ORDER — PANTOPRAZOLE SODIUM 40 MG PO TBEC: 40.0000 mg | DELAYED_RELEASE_TABLET | Freq: Two times a day (BID) | ORAL | 3 refills | Status: DC

## 2023-11-10 NOTE — Patient Instructions (Addendum)
 _______________________________________________________  If your blood pressure at your visit was 140/90 or greater, please contact your primary care physician to follow up on this.  _______________________________________________________  If you are age 72 or older, your body mass index should be between 23-30. Your Body mass index is 26.66 kg/m. If this is out of the aforementioned range listed, please consider follow up with your Primary Care Provider.  If you are age 30 or younger, your body mass index should be between 19-25. Your Body mass index is 26.66 kg/m. If this is out of the aformentioned range listed, please consider follow up with your Primary Care Provider.   ________________________________________________________  The [facility] providers would like to encourage you to use MYCHART to communicate with providers for non-urgent requests or questions.  Due to long hold times on the telephone, sending your provider a message by [facility] may be a faster and more efficient way to get a response.  Please allow 48 business hours for a response.  Please remember that this is for non-urgent requests.  _______________________________________________________  [facility] is using a team-based approach to care.  Your team is made up of your doctor and two to three APPS. Our APPS (Nurse Practitioners and Physician Assistants) work with your physician to ensure care continuity for you. They are fully qualified to address your health concerns and develop a treatment plan. They communicate directly with your gastroenterologist to care for you. Seeing the Advanced Practice Practitioners on your physician's team can help you by facilitating care more promptly, often allowing for earlier appointments, access to diagnostic testing, procedures, and other specialty referrals.   We have sent the following medications to your pharmacy for you to pick up at your convenience:  INCREASE:  Protonix  40mg  one tablet two times daily.  It was a pleasure to see you today!  Vito Cirigliano, D.O.

## 2023-11-10 NOTE — Progress Notes (Signed)
 Chief Complaint: GERD, chronic cough   HPI:     Christie Wells is a 73 y.o. female with history of HTN, hypothyroidism, prior TIA, IBS-D, GERD, referred to the [facility]ology [facility] for evaluation of GERD and chronic cough.   Was previously seen by Dr. Eda in 12/2018.  Reflux largely controlled with lansoprazole , but concerned that it might have been exacerbating her IBS-D.  Was also on Pepcid  20 mg nightly.  She reports a longstanding history of reflux, with index symptoms of regurgitation, throat clearing, dry cough. Nocturnal choking sensation early in course (resolved w/ PPI).   Reflux started worsening about 5 weeks ago, characterized by throat congestion/increased throat clearing, particularly post prandial, globus sensation, along with post prandial cough with clear phlegm production.  No HB. No dysphagia. Breakthrough sxs were despite still taking lansoprazole  30 mg BID. Will have heartburn with any missed doses.  Additionally describes metallic taste at times and burning tongue sensation.     Was seen by her PCP on 11/02/2023 and stopped lansoprazole  and started on pantoprazole  20 mg bid.  Today, she states she continues to have increased throat clearing, congestion, phlegm production, and globus sensation.  Endoscopic History: - 12/29/2013: Colonoscopy performed in [place]  - 04/01/2018: Colonoscopy: 3 tubular adenomas.  Recommended repeat in 2023 - 04/01/2018: EGD: Normal esophagus, biopsies consistent with esophagitis but no EOE - 09/06/2018: Esophagram: Normal - 01/20/2022: Colonoscopy: 10 mm flat proximal ascending colon polyp resected with saline injection and snare (SSP), 3 mm transverse colon polyp (adenoma), 2 small descending colon polyps (adenomas).  Repeat in 3 years       Latest Ref Rng & Units 09/15/2022   11:38 AM 10/13/2019    3:23 PM 11/07/2018    2:29 PM  CBC  WBC 4.0 - 10.5 K/uL 6.1  11.6  7.6   Hemoglobin 12.0 - 15.0 g/dL 85.3  83.3   85.5   Hematocrit 36.0 - 46.0 % 43.3  46.4  42.3   Platelets 150.0 - 400.0 K/uL 210.0  216  179.0       Latest Ref Rng & Units 05/27/2023   10:17 AM 04/27/2023    9:24 AM 04/16/2022   10:34 AM  Hepatic Function  Total Protein 6.0 - 8.3 g/dL 6.2  6.3  6.6   Albumin 3.5 - 5.2 g/dL 4.0  4.1  4.2   AST 0 - 37 U/L 12  13  15    ALT 0 - 35 U/L 9  10  11    Alk Phosphatase 39 - 117 U/L 99  118  131   Total Bilirubin 0.2 - 1.2 mg/dL 0.9  0.9  0.6   Bilirubin, Direct 0.0 - 0.3 mg/dL 0.1         Latest Ref Rng & Units 04/27/2023    9:24 AM 10/22/2022    9:59 AM 04/16/2022   10:34 AM  BMP  Glucose 70 - 99 mg/dL 86  92  94   BUN 6 - 23 mg/dL 15  12  16    Creatinine 0.40 - 1.20 mg/dL 9.20  9.06  8.91   Sodium 135 - 145 mEq/L 143  142  142   Potassium 3.5 - 5.1 mEq/L 3.4  3.7  3.8   Chloride 96 - 112 mEq/L 104  103  102   CO2 19 - 32 mEq/L 32  32  32   Calcium 8.4 - 10.5 mg/dL 9.0  9.4  9.3       Past Medical History:  Diagnosis Date   Arthritis    generalized   Blood transfusion without reported diagnosis 1953   Cataract 2022   bilateral sx   Chronic headaches    Colitis 1984   COVID-19 12/02/2021   COVID-19 virus infection 10/10/2020   GERD (gastroesophageal reflux disease)    on meds   Hypertension    on meds   Hypothyroidism    on meds   Microscopic hematuria 02/17/2022   Migraines    Peripheral neuropathy    bilateral feet   Personal history of atrial flutter    had ablation sx 02/2020   TIA (transient ischemic attack)    UTI (urinary tract infection)    Viral URI with cough 01/09/2021     Past Surgical History:  Procedure Laterality Date   BUNIONECTOMY Bilateral    CARDIAC ELECTROPHYSIOLOGY STUDY AND ABLATION  02/22/2020   CATARACT EXTRACTION, BILATERAL Bilateral 2022   COLONOSCOPY  2015   COLONOSCOPY  2020   KB-MAC-Suprep(exc)-SSA/TA   COLONOSCOPY     2023   FOOT SURGERY Bilateral 2010   5 screws  and again in 2012   POLYPECTOMY  2020   SSA/TA   TUBAL  LIGATION     UPPER GI ENDOSCOPY     Gastritis   WISDOM TOOTH EXTRACTION     Family History  Problem Relation Age of Onset   Alzheimer's disease Mother    Hypertension Mother    Hyperlipidemia Mother    COPD Mother    Aneurysm Mother    Stroke Father    Hypertension Father    Aneurysm Brother    Heart disease Maternal Grandmother    Parkinson's disease Paternal Grandfather    Colon cancer Neg Hx    Esophageal cancer Neg Hx    Breast cancer Neg Hx    Rectal cancer Neg Hx    Stomach cancer Neg Hx    Social History   Tobacco Use   Smoking status: Never   Smokeless tobacco: Never  Vaping Use   Vaping status: Never Used  Substance Use Topics   Alcohol use: Yes    Alcohol/week: 0.0 - 2.0 standard drinks of alcohol   Drug use: No   Current Outpatient Medications  Medication Sig Dispense Refill   ACETAMINOPHEN-BUTALBITAL 50-325 MG TABS Take by mouth. 50-325-40   as needed     amLODipine  (NORVASC ) 2.5 MG tablet TAKE 1 TABLET IN THE MORNING AND AT BEDTIME FOR BLOOD PRESSURE 180 tablet 2   CALCIUM-VITAMIN D  PO Take 1 tablet by mouth daily at 6 (six) AM.     cholecalciferol (VITAMIN D ) 1000 units tablet Take 3,000 Units by mouth daily.     cyanocobalamin  1000 MCG tablet Take 1,000 mcg by mouth daily.     gabapentin  (NEURONTIN ) 100 MG capsule Take 300 mg by mouth at bedtime.     hydrOXYzine  (ATARAX ) 10 MG tablet TAKE 1-2 TABLETS (10-20 MG TOTAL) BY MOUTH 2 (TWO) TIMES DAILY AS NEEDED FOR ANXIETY. 90 tablet 0   levothyroxine  (SYNTHROID ) 88 MCG tablet TAKE 1 TAB BY MOUTH EVERY MORNING ON EMPTY STOMACH WITH WATER ONLY. NO FOOD OR OTHER MEDS FOR 30 MIN 90 tablet 2   pantoprazole  (PROTONIX ) 20 MG tablet Take 1 tablet (20 mg total) by mouth 2 (two) times daily before a meal. for heartburn. 180 tablet 0   No current facility-administered medications for this visit.   Allergies  Allergen Reactions   Codeine  Nausea And Vomiting    Other reaction(s): vomiting    Peanut-Containing Drug  Products     Headaches   Sulfa Antibiotics Rash    Other reaction(s): rash   Topamax  [Topiramate ] Other (See Comments)    Dizzy, numbness/tingling     Review of Systems: All systems reviewed and negative except where noted in HPI.     Physical Exam:    Wt Readings from Last 3 Encounters:  11/10/23 171 lb 8 oz (77.8 kg)  10/07/23 171 lb (77.6 kg)  04/27/23 171 lb (77.6 kg)    BP 120/76   Pulse 60   Ht 5' 7.25 (1.708 m)   Wt 171 lb 8 oz (77.8 kg)   BMI 26.66 kg/m  Constitutional:  Pleasant, in no acute distress. Psychiatric: Normal mood and affect. Behavior is normal. Cardiovascular: Normal rate, regular rhythm. No edema Pulmonary/chest: Effort normal and breath sounds normal. No wheezing, rales or rhonchi. Abdominal: Soft, nondistended, nontender. Bowel sounds active throughout. There are no masses palpable. No hepatomegaly. Neurological: Alert and oriented to person place and time. Skin: Skin is warm and dry. No rashes noted.   ASSESSMENT AND PLAN;   1) GERD 2) Globus sensation 3) Increased throat clearing Discussed pathophysiology of reflux at length today.  Worsening reflux despite continued high-dose lansoprazole  30 mg twice daily.  Could be that medication is just no longer efficacious after using for 10+ years, or possibly due to development of LES laxity or hiatal hernia.  Plan for the following:  - Increase pantoprazole  to 40 mg twice daily.  I asked her to message me in the next 4 weeks or so to let me know how she is doing.  If ongoing symptoms, plan for EGD to evaluate for erosive esophagitis, LES laxity, hiatal hernia - Continue antireflux lifestyle/dietary modifications - Consider Bravo vs pH/Mii or repeat esophagram  4) History of colon polyps - Repeat colonoscopy in 01/2025 for ongoing polyp surveillance    Sandor LULLA Flatter, DO, FACG  11/10/2023, 10:51 AM   Clark, Katherine K, NP

## 2024-01-05 ENCOUNTER — Other Ambulatory Visit: Payer: Self-pay | Admitting: Primary Care

## 2024-01-05 DIAGNOSIS — Z1231 Encounter for screening mammogram for malignant neoplasm of breast: Secondary | ICD-10-CM

## 2024-01-20 ENCOUNTER — Ambulatory Visit (INDEPENDENT_AMBULATORY_CARE_PROVIDER_SITE_OTHER)

## 2024-01-20 VITALS — BP 114/70 | Ht 67.25 in | Wt 177.4 lb

## 2024-01-20 DIAGNOSIS — Z0000 Encounter for general adult medical examination without abnormal findings: Secondary | ICD-10-CM | POA: Diagnosis not present

## 2024-01-20 NOTE — Progress Notes (Signed)
 Subjective:   Christie Wells is a 73 y.o. female who presents for a Medicare Annual Wellness Visit.  Allergies (verified) Codeine, Peanut-containing drug products, Sulfa antibiotics, and Topamax  [topiramate ]   History: Past Medical History:  Diagnosis Date   Allergy 1965   Peanuts   Anxiety 2018   Arthritis    generalized   Blood transfusion without reported diagnosis 1953   Cataract 2022   bilateral sx   Chronic headaches    Colitis 1984   COVID-19 12/02/2021   COVID-19 virus infection 10/10/2020   GERD (gastroesophageal reflux disease)    on meds   Hypertension    on meds   Hypothyroidism    on meds   Microscopic hematuria 02/17/2022   Migraines    Peripheral neuropathy    bilateral feet   Personal history of atrial flutter    had ablation sx 02/2020   TIA (transient ischemic attack)    UTI (urinary tract infection)    Viral URI with cough 01/09/2021   Past Surgical History:  Procedure Laterality Date   BUNIONECTOMY Bilateral    CARDIAC ELECTROPHYSIOLOGY STUDY AND ABLATION  02/22/2020   CATARACT EXTRACTION, BILATERAL Bilateral 2022   COLONOSCOPY  2015   COLONOSCOPY  2020   KB-MAC-Suprep(exc)-SSA/TA   COLONOSCOPY     2023   EYE SURGERY  2022   FOOT SURGERY Bilateral 2010   5 screws  and again in 2012   POLYPECTOMY  2020   SSA/TA   TUBAL LIGATION     UPPER GI ENDOSCOPY     Gastritis   WISDOM TOOTH EXTRACTION     Family History  Problem Relation Age of Onset   Alzheimer's disease Mother    Hypertension Mother    Hyperlipidemia Mother    COPD Mother    Aneurysm Mother    Depression Mother    Stroke Father    Hypertension Father    Aneurysm Brother    Heart disease Maternal Grandmother    Parkinson's disease Paternal Grandfather    Stroke Paternal Aunt    Stroke Paternal Aunt    Stroke Paternal Aunt    Colon cancer Neg Hx    Esophageal cancer Neg Hx    Breast cancer Neg Hx    Rectal cancer Neg Hx    Stomach cancer Neg Hx    Social  History   Occupational History   Not on file  Tobacco Use   Smoking status: Never   Smokeless tobacco: Never  Vaping Use   Vaping status: Never Used  Substance and Sexual Activity   Alcohol use: Yes    Alcohol/week: 1.0 standard drink of alcohol    Types: 1 Standard drinks or equivalent per week   Drug use: No   Sexual activity: Not Currently   Tobacco Counseling Counseling given: Not Answered  SDOH Screenings   Food Insecurity: No Food Insecurity (01/19/2024)  Housing: Low Risk  (01/19/2024)  Transportation Needs: No Transportation Needs (01/19/2024)  Utilities: Not At Risk (01/20/2024)  Alcohol Screen: Low Risk  (01/19/2024)  Depression (PHQ2-9): Low Risk  (01/20/2024)  Financial Resource Strain: Low Risk  (01/19/2024)  Physical Activity: Insufficiently Active (01/19/2024)  Social Connections: Moderately Isolated (01/20/2024)  Stress: No Stress Concern Present (01/20/2024)  Tobacco Use: Low Risk  (01/20/2024)  Health Literacy: Adequate Health Literacy (01/20/2024)   Depression Screen    01/20/2024    1:56 PM 10/07/2023   12:22 PM 04/27/2023    8:54 AM 03/03/2023   10:25 AM 01/14/2023  10:02 AM 11/24/2022   12:08 PM 10/22/2022    9:25 AM  PHQ 2/9 Scores  PHQ - 2 Score 0 0 0 0 0 0 0  PHQ- 9 Score   0  2         Data saved with a previous flowsheet row definition     Goals Addressed             This Visit's Progress    COMPLETED: DIET - INCREASE WATER INTAKE       Patient Stated       I would like find new neurologist        Visit info / Clinical Intake: Medicare Wellness Visit Type:: Subsequent Annual Wellness Visit Medicare Wellness Visit Mode:: In-person (required for WTM) Interpreter Needed?: No Pre-visit prep was completed: yes AWV questionnaire completed by patient prior to visit?: yes Date:: 01/19/24 Living arrangements:: (!) lives alone Patient's Overall Health Status Rating: very good Typical amount of pain: some Does pain affect daily life?: no Are  you currently prescribed opioids?: no  Dietary Habits and Nutritional Risks How many meals a day?: 2 Eats fruit and vegetables daily?: yes Most meals are obtained by: preparing own meals; eating out (prepare 40% or 60%) In the last 2 weeks, have you had any of the following?: -- (none) Diabetic:: no  Functional Status Activities of Daily Living (to include ambulation/medication): (Patient-Rptd) Independent Ambulation: (Patient-Rptd) Independent Medication Administration: Independent Home Management: (Patient-Rptd) Independent Manage your own finances?: yes Primary transportation is: driving Concerns about vision?: no *vision screening is required for WTM* Concerns about hearing?: no  Fall Screening Falls in the past year?: (Patient-Rptd) 0 Number of falls in past year: 0 Was there an injury with Fall?: 0 Fall Risk Category Calculator: 0 Patient Fall Risk Level: Low Fall Risk  Fall Risk Patient at Risk for Falls Due to: No Fall Risks Fall risk Follow up: Education provided; Falls prevention discussed  Home and Transportation Safety: All rugs have non-skid backing?: N/A, no rugs All stairs or steps have railings?: N/A, no stairs Grab bars in the bathtub or shower?: yes Have non-skid surface in bathtub or shower?: yes Good home lighting?: yes Regular seat belt use?: yes Hospital stays in the last year:: no  Cognitive Assessment Difficulty concentrating, remembering, or making decisions? : no Will 6CIT or Mini Cog be Completed: yes What year is it?: 0 points What month is it?: 0 points Give patient an address phrase to remember (5 components): [address] [facility]  About what time is it?: 0 points Count backwards from 20 to 1: 0 points Say the months of the year in reverse: 0 points Repeat the address phrase from earlier: 0 points 6 CIT Score: 0 points  Advance Directives (For Healthcare) Does Patient Have a Medical Advance Directive?: Yes Type of Advance  Directive: Healthcare Power of [place]; Living will Copy of Healthcare Power of Attorney in Chart?: No - copy requested Copy of Living Will in Chart?: No - copy requested  Reviewed/Updated  Reviewed/Updated: All       Objective:    Today's Vitals   01/20/24 1345  BP: 114/70  Weight: 177 lb 6.4 oz (80.5 kg)  Height: 5' 7.25 (1.708 m)   Body mass index is 27.58 kg/m.  Current Medications (verified) Outpatient Encounter Medications as of 01/20/2024  Medication Sig   ACETAMINOPHEN-BUTALBITAL 50-325 MG TABS Take by mouth. 50-325-40   as needed   amLODipine  (NORVASC ) 2.5 MG tablet TAKE 1 TABLET IN THE  MORNING AND AT BEDTIME FOR BLOOD PRESSURE   CALCIUM-VITAMIN D  PO Take 1 tablet by mouth daily at 6 (six) AM.   cholecalciferol (VITAMIN D ) 1000 units tablet Take 3,000 Units by mouth daily.   cyanocobalamin  1000 MCG tablet Take 1,000 mcg by mouth daily.   gabapentin  (NEURONTIN ) 100 MG capsule Take 300 mg by mouth at bedtime.   hydrOXYzine  (ATARAX ) 10 MG tablet TAKE 1-2 TABLETS (10-20 MG TOTAL) BY MOUTH 2 (TWO) TIMES DAILY AS NEEDED FOR ANXIETY.   levothyroxine  (SYNTHROID ) 88 MCG tablet TAKE 1 TAB BY MOUTH EVERY MORNING ON EMPTY STOMACH WITH WATER ONLY. NO FOOD OR OTHER MEDS FOR 30 MIN   pantoprazole  (PROTONIX ) 40 MG tablet Take 1 tablet (40 mg total) by mouth 2 (two) times daily before a meal. for heartburn.   No facility-administered encounter medications on file as of 01/20/2024.   Hearing/Vision screen Vision Screening - Comments:: UTD w/eye appts w/[facility] Immunizations and Health Maintenance Health Maintenance  Topic Date Due   Mammogram  02/04/2024   DTaP/Tdap/Td (2 - Td or Tdap) 05/31/2024   COVID-19 Vaccine (6 - Pfizer risk 2025-26 season) 06/29/2024   Medicare Annual Wellness (AWV)  01/19/2025   Colonoscopy  01/20/2025   Pneumococcal Vaccine: 50+ Years  Completed   Influenza Vaccine  Completed   DEXA SCAN  Completed   Hepatitis C Screening  Completed   Zoster  Vaccines- Shingrix  Completed   Meningococcal B Vaccine  Aged Out        Assessment/Plan:  This is a routine wellness examination for Mailin.  Patient Care Team: Gretta Comer POUR, NP as PCP - General (Internal Medicine) Cindie Ole DASEN, MD as PCP - Electrophysiology (Cardiology) Margeret Kotyk, MD as Referring Physician (Physical Medicine and Rehabilitation) Portia Fireman, OD (Optometry)  I have personally reviewed and noted the following in the patient's chart:   Medical and social history Use of alcohol, tobacco or illicit drugs  Current medications and supplements including opioid prescriptions. Functional ability and status Nutritional status Physical activity Advanced directives List of other physicians Hospitalizations, surgeries, and ER visits in previous 12 months Vitals Screenings to include cognitive, depression, and falls Referrals and appointments  No orders of the defined types were placed in this encounter.  In addition, I have reviewed and discussed with patient certain preventive protocols, quality metrics, and best practice recommendations. A written personalized care plan for preventive services as well as general preventive health recommendations were provided to patient.   Erminio LITTIE Saris, LPN   [id number]/05/[id number]   Return in 1 year (on 01/19/2025).  After Visit Summary: (In Person-Declined) Patient declined AVS at this time.  Nurse Notes: Pt has no concerns or questions. AWV made for one year

## 2024-01-20 NOTE — Patient Instructions (Signed)
 Christie Wells,  Thank you for taking the time for your Medicare Wellness Visit. I appreciate your continued commitment to your health goals. Please review the care plan we discussed, and feel free to reach out if I can assist you further.  Please note that Annual Wellness Visits do not include a physical exam. Some assessments may be limited, especially if the visit was conducted virtually. If needed, we may recommend an in-person follow-up with your provider.  Ongoing Care Seeing your primary care provider every 3 to 6 months helps us  monitor your health and provide consistent, personalized care. -+  Referrals If a referral was made during today's visit and you haven't received any updates within two weeks, please contact the referred provider directly to check on the status.  Recommended Screenings:  Health Maintenance  Topic Date Due   Medicare Annual Wellness Visit  01/14/2024   Breast Cancer Screening  02/04/2024   DTaP/Tdap/Td vaccine (2 - Td or Tdap) 05/31/2024   COVID-19 Vaccine (6 - Pfizer risk 2025-26 season) 06/29/2024   Colon Cancer Screening  01/20/2025   Pneumococcal Vaccine for age over 1  Completed   Flu Shot  Completed   DEXA scan (bone density measurement)  Completed   Hepatitis C Screening  Completed   Zoster (Shingles) Vaccine  Completed   Meningitis B Vaccine  Aged Out       01/19/2024    8:12 AM  Advanced Directives  Does Patient Have a Medical Advance Directive? Yes  Type of Estate Agent of [place];Living will  Copy of Healthcare Power of Attorney in Chart? No - copy requested    Vision: Annual vision screenings are recommended for early detection of glaucoma, cataracts, and diabetic retinopathy. These exams can also reveal signs of chronic conditions such as diabetes and high blood pressure.  Dental: Annual dental screenings help detect early signs of oral cancer, gum disease, and other conditions linked to overall health, including  heart disease and diabetes.

## 2024-02-05 ENCOUNTER — Other Ambulatory Visit: Payer: Self-pay | Admitting: Gastroenterology

## 2024-02-05 DIAGNOSIS — R09A2 Foreign body sensation, throat: Secondary | ICD-10-CM

## 2024-02-14 ENCOUNTER — Encounter

## 2024-03-01 ENCOUNTER — Inpatient Hospital Stay: Admission: RE | Admit: 2024-03-01 | Discharge: 2024-03-01 | Attending: Primary Care

## 2024-03-01 ENCOUNTER — Other Ambulatory Visit: Payer: Self-pay | Admitting: Primary Care

## 2024-03-01 DIAGNOSIS — Z1231 Encounter for screening mammogram for malignant neoplasm of breast: Secondary | ICD-10-CM | POA: Insufficient documentation

## 2024-03-01 DIAGNOSIS — F419 Anxiety disorder, unspecified: Secondary | ICD-10-CM

## 2024-03-06 ENCOUNTER — Ambulatory Visit: Payer: Self-pay | Admitting: Primary Care

## 2024-03-11 ENCOUNTER — Other Ambulatory Visit: Payer: Self-pay | Admitting: Primary Care

## 2024-03-11 DIAGNOSIS — E039 Hypothyroidism, unspecified: Secondary | ICD-10-CM

## 2024-03-28 ENCOUNTER — Encounter: Payer: Self-pay | Admitting: Gastroenterology

## 2024-04-07 ENCOUNTER — Ambulatory Visit: Admitting: Primary Care

## 2024-04-07 ENCOUNTER — Ambulatory Visit: Payer: Self-pay

## 2024-04-07 ENCOUNTER — Telehealth

## 2024-04-07 ENCOUNTER — Encounter: Payer: Self-pay | Admitting: Primary Care

## 2024-04-07 VITALS — BP 134/80 | HR 60 | Temp 97.9°F | Ht 67.0 in | Wt 175.4 lb

## 2024-04-07 DIAGNOSIS — R300 Dysuria: Secondary | ICD-10-CM

## 2024-04-07 LAB — URINALYSIS, MICROSCOPIC ONLY

## 2024-04-07 LAB — POC URINALSYSI DIPSTICK (AUTOMATED)
Bilirubin, UA: NEGATIVE
Glucose, UA: NEGATIVE
Ketones, UA: NEGATIVE
Leukocytes, UA: NEGATIVE
Nitrite, UA: NEGATIVE
Protein, UA: NEGATIVE
Spec Grav, UA: 1.01
Urobilinogen, UA: 0.2 U/dL
pH, UA: 8

## 2024-04-07 MED ORDER — NITROFURANTOIN MONOHYD MACRO 100 MG PO CAPS: 100.0000 mg | ORAL_CAPSULE | Freq: Two times a day (BID) | ORAL | 0 refills | Status: AC

## 2024-04-07 NOTE — Progress Notes (Signed)
 "  Subjective:    Patient ID: Christie Wells, female    DOB: 03/20/1950, 74 y.o.   MRN: [id number]  Christie Wells is a very pleasant 74 y.o. female with a history of hypertension, migraines, atrial flutter, hypothyroidism, hyperlipidemia who presents today to discuss dysuria.   Symptom onset two evenings ago with dysuria, urinary frequency, and foul smelling urine. Yesterday her symptoms progressed. She denies hematuria, fevers. She has not taken anything OTC.   She drinks plenty of water daily. She's been using OTC Vagisil Odor Block about 1 year ago. She isn't sure if this is the cause. Her symptoms today feel like a urinary tract infection.    Review of Systems  Constitutional:  Negative for fever.  Gastrointestinal:  Negative for abdominal pain.  Genitourinary:  Positive for dysuria and frequency. Negative for flank pain, hematuria, pelvic pain and vaginal discharge.         Past Medical History:  Diagnosis Date   Allergy 1965   Peanuts   Anxiety 2018   Arthritis    generalized   Blood transfusion without reported diagnosis 1953   Cataract 2022   bilateral sx   Chronic headaches    Colitis 1984   COVID-19 12/02/2021   COVID-19 virus infection 10/10/2020   GERD (gastroesophageal reflux disease)    on meds   Hypertension    on meds   Hypothyroidism    on meds   Microscopic hematuria 02/17/2022   Migraines    Peripheral neuropathy    bilateral feet   Personal history of atrial flutter    had ablation sx 02/2020   TIA (transient ischemic attack)    UTI (urinary tract infection)    Viral URI with cough 01/09/2021    Social History   Socioeconomic History   Marital status: Divorced    Spouse name: Not on file   Number of children: 2   Years of education: Not on file   Highest education level: Master's degree (e.g., MA, MS, MEng, MEd, MSW, MBA)  Occupational History   Not on file  Tobacco Use   Smoking status: Never   Smokeless tobacco: Never  Vaping  Use   Vaping status: Never Used  Substance and Sexual Activity   Alcohol use: Yes    Alcohol/week: 1.0 standard drink of alcohol    Types: 1 Standard drinks or equivalent per week   Drug use: No   Sexual activity: Not Currently  Other Topics Concern   Not on file  Social History Narrative   Divorced.   2 children.   Moved from [place] .   Once worked as an Runner, broadcasting/film/video.       Social Drivers of Health   Tobacco Use: Low Risk (04/07/2024)   Patient History    Smoking Tobacco Use: Never    Smokeless Tobacco Use: Never    Passive Exposure: Not on file  Financial Resource Strain: Low Risk (01/19/2024)   Overall Financial Resource Strain (CARDIA)    Difficulty of Paying Living Expenses: Not hard at all  Food Insecurity: No Food Insecurity (01/19/2024)   Epic    Worried About Programme Researcher, Broadcasting/film/video in the Last Year: Never true    Ran Out of Food in the Last Year: Never true  Transportation Needs: No Transportation Needs (01/19/2024)   Epic    Lack of Transportation (Medical): No    Lack of Transportation (Non-Medical): No  Physical Activity: Insufficiently Active (01/19/2024)   Exercise Vital Sign  Days of Exercise per Week: 2 days    Minutes of Exercise per Session: 40 min  Stress: No Stress Concern Present (01/20/2024)   [employer] of Occupational Health - Occupational Stress Questionnaire    Feeling of Stress: Only a little  Social Connections: Moderately Isolated (01/20/2024)   Social Connection and Isolation Panel    Frequency of Communication with Friends and Family: More than three times a week    Frequency of Social Gatherings with Friends and Family: More than three times a week    Attends Religious Services: Never    Database Administrator or Organizations: Yes    Attends Engineer, Structural: More than 4 times per year    Marital Status: Divorced  Intimate Partner Violence: Not At Risk (01/20/2024)   Epic    Fear of Current or Ex-Partner: No     Emotionally Abused: No    Physically Abused: No    Sexually Abused: No  Depression (PHQ2-9): Low Risk (04/07/2024)   Depression (PHQ2-9)    PHQ-2 Score: 0  Alcohol Screen: Low Risk (01/19/2024)   Alcohol Screen    Last Alcohol Screening Score (AUDIT): 1  Housing: Low Risk (01/19/2024)   Epic    Unable to Pay for Housing in the Last Year: No    Number of Times Moved in the Last Year: 0    Homeless in the Last Year: No  Utilities: Not At Risk (01/20/2024)   Epic    Threatened with loss of utilities: No  Health Literacy: Adequate Health Literacy (01/20/2024)   B1300 Health Literacy    Frequency of need for help with medical instructions: Never    Past Surgical History:  Procedure Laterality Date   BUNIONECTOMY Bilateral    CARDIAC ELECTROPHYSIOLOGY STUDY AND ABLATION  02/22/2020   CATARACT EXTRACTION, BILATERAL Bilateral 2022   COLONOSCOPY  2015   COLONOSCOPY  2020   KB-MAC-Suprep(exc)-SSA/TA   COLONOSCOPY     2023   EYE SURGERY  2022   FOOT SURGERY Bilateral 2010   5 screws  and again in 2012   POLYPECTOMY  2020   SSA/TA   TUBAL LIGATION     UPPER GI ENDOSCOPY     Gastritis   WISDOM TOOTH EXTRACTION      Family History  Problem Relation Age of Onset   Alzheimer's disease Mother    Hypertension Mother    Hyperlipidemia Mother    COPD Mother    Aneurysm Mother    Depression Mother    Stroke Father    Hypertension Father    Aneurysm Brother    Heart disease Maternal Grandmother    Parkinson's disease Paternal Grandfather    Stroke Paternal Aunt    Stroke Paternal Aunt    Stroke Paternal Aunt    Colon cancer Neg Hx    Esophageal cancer Neg Hx    Breast cancer Neg Hx    Rectal cancer Neg Hx    Stomach cancer Neg Hx     Allergies[1]  Medications Ordered Prior to Encounter[2]  BP 134/80   Pulse 60   Temp 97.9 F (36.6 C) (Oral)   Ht 5' 7 (1.702 m)   Wt 175 lb 6 oz (79.5 kg)   SpO2 97%   BMI 27.47 kg/m  Objective:   Physical Exam Cardiovascular:      Rate and Rhythm: Normal rate.  Pulmonary:     Effort: Pulmonary effort is normal.  Musculoskeletal:     Cervical back: Neck  supple.  Skin:    General: Skin is warm and dry.  Neurological:     Mental Status: She is alert and oriented to person, place, and time.  Psychiatric:        Mood and Affect: Mood normal.     Physical Exam        Assessment & Plan:  Dysuria Assessment & Plan: Symptoms suggestive.  UA today with trace blood. Negative for leuks and nitrites.  Add urine culture today.  She does have evidence of blood on prior in office UAs with negative microscopic and infection. Will add urine microscopic.   Given her symptoms, coupled with the inclement weather, will provide antibiotics and instruct to start if symptoms do not improve/progress. She agrees Start Macrobid (nitrofurantoin).. Take 1 tablet by mouth twice daily for 5 days.  Await results.    Orders: -     POCT Urinalysis Dipstick (Automated) -     Urine Culture -     Urine Microscopic -     Nitrofurantoin Monohyd Macro; Take 1 capsule (100 mg total) by mouth 2 (two) times daily for 5 days.  Dispense: 10 capsule; Refill: 0    Assessment and Plan Assessment & Plan         Comer MARLA Gaskins, NP       [1]  Allergies Allergen Reactions   Codeine Nausea And Vomiting    Other reaction(s): vomiting    Peanut-Containing Drug Products     Headaches   Sulfa Antibiotics Rash    Other reaction(s): rash   Topamax  [Topiramate ] Other (See Comments)    Dizzy, numbness/tingling  [2]  Current Outpatient Medications on File Prior to Visit  Medication Sig Dispense Refill   ACETAMINOPHEN-BUTALBITAL 50-325 MG TABS Take by mouth. 50-325-40   as needed     amLODipine  (NORVASC ) 2.5 MG tablet TAKE 1 TABLET IN THE MORNING AND AT BEDTIME FOR BLOOD PRESSURE 180 tablet 2   CALCIUM-VITAMIN D  PO Take 1 tablet by mouth daily at 6 (six) AM.     cholecalciferol (VITAMIN D ) 1000 units tablet Take 3,000 Units  by mouth daily.     cyanocobalamin  1000 MCG tablet Take 1,000 mcg by mouth daily.     gabapentin  (NEURONTIN ) 100 MG capsule Take 300 mg by mouth at bedtime.     hydrOXYzine  (ATARAX ) 10 MG tablet TAKE 1-2 TABLETS (10-20 MG TOTAL) BY MOUTH 2 (TWO) TIMES DAILY AS NEEDED FOR ANXIETY. 90 tablet 0   levothyroxine  (SYNTHROID ) 88 MCG tablet TAKE 1 TAB BY MOUTH EVERY MORNING ON EMPTY STOMACH WITH WATER ONLY. NO FOOD OR OTHER MEDS FOR 90 tablet 0   pantoprazole  (PROTONIX ) 40 MG tablet TAKE 1 TABLET (40 MG TOTAL) BY MOUTH 2 (TWO) TIMES DAILY BEFORE A MEAL. FOR HEARTBURN. 180 tablet 1   No current facility-administered medications on file prior to visit.   "

## 2024-04-07 NOTE — Telephone Encounter (Signed)
 FYI Only or Action Required?: Action required by provider: request for appointment.  Patient was last seen in primary care on 11/02/2023 by Gretta Comer POUR, NP.  Called Nurse Triage reporting urinary symptoms.  Symptoms began yesterday.  Interventions attempted: Nothing.  Symptoms are: gradually worsening.Urinary burning, odor to urine. Has V[facility] through [facility].  Triage Disposition: See Physician Within 24 Hours  Patient/caregiver understands and will follow disposition?: Yes      Reason for Triage: Pt stated that she thinks she has a UTI due to experiencing pain when urinating and has an odor. Pt wants to schedule an appt with a provider today if possible.    Reason for Disposition  Bad or foul-smelling urine  Answer Assessment - Initial Assessment Questions 1. SYMPTOM: What's the main symptom you're concerned about? (e.g., frequency, incontinence)     Pain, odor 2. ONSET: When did the    start?     Last night 3. PAIN: Is there any pain? If Yes, ask: How bad is it? (Scale: 1-10; mild, moderate, severe)     5-6 4. CAUSE: What do you think is causing the symptoms?     UTI 5. OTHER SYMPTOMS: Do you have any other symptoms? (e.g., blood in urine, fever, flank pain, pain with urination)     NO 6. PREGNANCY: Is there any chance you are pregnant? When was your last menstrual period?     NO  Protocols used: Urinary Symptoms-A-AH

## 2024-04-07 NOTE — Assessment & Plan Note (Addendum)
 Symptoms suggestive.  UA today with trace blood. Negative for leuks and nitrites.  Add urine culture today.  She does have evidence of blood on prior in office UAs with negative microscopic and infection. Will add urine microscopic.   Given her symptoms, coupled with the inclement weather, will provide antibiotics and instruct to start if symptoms do not improve/progress. She agrees Start Macrobid (nitrofurantoin).. Take 1 tablet by mouth twice daily for 5 days.  Await results.

## 2024-04-07 NOTE — Telephone Encounter (Signed)
 Can we call patient and have her scheduled today? Looks like I have openings.

## 2024-04-07 NOTE — Patient Instructions (Addendum)
 We will be in touch once we receive your urine test results.   Start Macrobid (nitrofurantoin) tablets if your symptoms progress.  Take 1 tablet by mouth twice daily for 5 days.   It was a pleasure to see you today!

## 2024-04-08 LAB — URINE CULTURE
MICRO NUMBER:: 17508552
Result:: NO GROWTH
SPECIMEN QUALITY:: ADEQUATE

## 2024-04-09 ENCOUNTER — Ambulatory Visit: Payer: Self-pay | Admitting: Primary Care

## 2024-04-13 ENCOUNTER — Other Ambulatory Visit: Payer: Self-pay | Admitting: Primary Care

## 2024-04-13 DIAGNOSIS — I10 Essential (primary) hypertension: Secondary | ICD-10-CM

## 2024-05-04 ENCOUNTER — Encounter: Admitting: Primary Care

## 2025-01-23 ENCOUNTER — Ambulatory Visit
